# Patient Record
Sex: Female | Born: 1975
Health system: Southern US, Community
[De-identification: ages and names within clinical notes are randomized; demographics above are authoritative.]

## PROBLEM LIST (undated history)

## (undated) DIAGNOSIS — J301 Allergic rhinitis due to pollen: Secondary | ICD-10-CM

## (undated) DIAGNOSIS — E559 Vitamin D deficiency, unspecified: Secondary | ICD-10-CM

## (undated) DIAGNOSIS — L309 Dermatitis, unspecified: Secondary | ICD-10-CM

## (undated) DIAGNOSIS — L508 Other urticaria: Secondary | ICD-10-CM

## (undated) DIAGNOSIS — J45909 Unspecified asthma, uncomplicated: Secondary | ICD-10-CM

## (undated) DIAGNOSIS — T7840XA Allergy, unspecified, initial encounter: Secondary | ICD-10-CM

## (undated) DIAGNOSIS — I10 Essential (primary) hypertension: Secondary | ICD-10-CM

## (undated) HISTORY — PX: NASAL SINUS SURGERY: SHX719

## (undated) HISTORY — PX: SPINAL FUSION: SHX223

## (undated) HISTORY — PX: ABDOMINAL HYSTERECTOMY: SHX81

## (undated) HISTORY — PX: PLANTAR FASCIA RELEASE: SHX2239

## (undated) HISTORY — DX: Vitamin D deficiency, unspecified: E55.9

## (undated) HISTORY — DX: Dermatitis, unspecified: L30.9

## (undated) HISTORY — DX: Unspecified asthma, uncomplicated: J45.909

## (undated) HISTORY — DX: Allergy, unspecified, initial encounter: T78.40XA

## (undated) HISTORY — DX: Other urticaria: L50.8

## (undated) HISTORY — PX: OTHER SURGICAL HISTORY: SHX169

## (undated) HISTORY — DX: Allergic rhinitis due to pollen: J30.1

## (undated) HISTORY — DX: Essential (primary) hypertension: I10

---

## 2003-11-12 ENCOUNTER — Other Ambulatory Visit: Payer: Self-pay

## 2004-12-07 ENCOUNTER — Ambulatory Visit: Payer: Self-pay | Admitting: Otolaryngology

## 2006-03-10 ENCOUNTER — Ambulatory Visit: Payer: Self-pay | Admitting: Unknown Physician Specialty

## 2006-12-25 ENCOUNTER — Emergency Department: Payer: Self-pay | Admitting: Emergency Medicine

## 2007-02-15 ENCOUNTER — Ambulatory Visit: Payer: Self-pay | Admitting: Unknown Physician Specialty

## 2007-02-23 ENCOUNTER — Inpatient Hospital Stay: Payer: Self-pay | Admitting: Unknown Physician Specialty

## 2008-12-22 ENCOUNTER — Emergency Department: Payer: Self-pay | Admitting: Emergency Medicine

## 2009-03-04 ENCOUNTER — Inpatient Hospital Stay (HOSPITAL_COMMUNITY): Admission: RE | Admit: 2009-03-04 | Discharge: 2009-03-09 | Payer: Self-pay | Admitting: Neurosurgery

## 2009-10-06 ENCOUNTER — Encounter: Admission: RE | Admit: 2009-10-06 | Discharge: 2009-10-06 | Payer: Self-pay | Admitting: Neurosurgery

## 2010-09-18 LAB — TYPE AND SCREEN
ABO/RH(D): O POS
Antibody Screen: NEGATIVE

## 2010-09-18 LAB — BASIC METABOLIC PANEL
BUN: 6 mg/dL (ref 6–23)
Chloride: 106 mEq/L (ref 96–112)
Creatinine, Ser: 0.73 mg/dL (ref 0.4–1.2)
Glucose, Bld: 86 mg/dL (ref 70–99)

## 2010-09-18 LAB — CBC
Hemoglobin: 14.8 g/dL (ref 12.0–15.0)
MCV: 94.8 fL (ref 78.0–100.0)
Platelets: 227 10*3/uL (ref 150–400)

## 2010-09-18 LAB — ABO/RH: ABO/RH(D): O POS

## 2012-05-05 LAB — HM PAP SMEAR

## 2012-06-19 ENCOUNTER — Ambulatory Visit: Payer: Self-pay | Admitting: Family Medicine

## 2012-12-12 DIAGNOSIS — M961 Postlaminectomy syndrome, not elsewhere classified: Secondary | ICD-10-CM | POA: Insufficient documentation

## 2012-12-12 DIAGNOSIS — G4700 Insomnia, unspecified: Secondary | ICD-10-CM | POA: Insufficient documentation

## 2013-03-08 ENCOUNTER — Ambulatory Visit: Payer: Self-pay | Admitting: Anesthesiology

## 2013-03-08 DIAGNOSIS — I10 Essential (primary) hypertension: Secondary | ICD-10-CM

## 2013-03-15 ENCOUNTER — Other Ambulatory Visit: Payer: Self-pay | Admitting: Physical Medicine and Rehabilitation

## 2013-03-15 DIAGNOSIS — M961 Postlaminectomy syndrome, not elsewhere classified: Secondary | ICD-10-CM

## 2013-03-26 ENCOUNTER — Other Ambulatory Visit: Payer: Self-pay

## 2013-04-04 ENCOUNTER — Ambulatory Visit: Payer: Self-pay | Admitting: Otolaryngology

## 2013-04-05 LAB — PATHOLOGY REPORT

## 2013-07-17 ENCOUNTER — Ambulatory Visit: Payer: Self-pay | Admitting: Family Medicine

## 2013-10-22 DIAGNOSIS — F1120 Opioid dependence, uncomplicated: Secondary | ICD-10-CM | POA: Insufficient documentation

## 2013-10-23 NOTE — Progress Notes (Signed)
[facility], site is unremarkable and pt tolerated procedure well. MRI will be done on Saturday.

## 2013-10-27 ENCOUNTER — Inpatient Hospital Stay
Admission: RE | Admit: 2013-10-27 | Discharge: 2013-10-27 | Disposition: A | Discharge status: Home / Self Care | Payer: Medicaid Other | Source: Ambulatory Visit | Attending: Physical Medicine and Rehabilitation | Admitting: Physical Medicine and Rehabilitation

## 2013-11-03 ENCOUNTER — Ambulatory Visit
Admission: RE | Admit: 2013-11-03 | Discharge: 2013-11-03 | Disposition: A | Discharge status: Home / Self Care | Payer: Medicaid Other | Source: Ambulatory Visit | Attending: Physical Medicine and Rehabilitation | Admitting: Physical Medicine and Rehabilitation

## 2013-11-03 DIAGNOSIS — M961 Postlaminectomy syndrome, not elsewhere classified: Secondary | ICD-10-CM

## 2013-11-03 MED ORDER — GADOBENATE DIMEGLUMINE 529 MG/ML IV SOLN
17.0000 mL | Freq: Once | INTRAVENOUS | Status: AC | PRN
  Administered 2013-11-03: 17 mL via INTRAVENOUS

## 2014-07-25 ENCOUNTER — Other Ambulatory Visit: Payer: Self-pay | Admitting: Neurosurgery

## 2014-07-25 DIAGNOSIS — M5126 Other intervertebral disc displacement, lumbar region: Secondary | ICD-10-CM

## 2014-08-05 ENCOUNTER — Other Ambulatory Visit: Payer: Medicaid Other

## 2014-08-05 ENCOUNTER — Inpatient Hospital Stay: Admission: RE | Admit: 2014-08-05 | Payer: Medicaid Other | Source: Ambulatory Visit

## 2014-08-07 ENCOUNTER — Ambulatory Visit
Admission: RE | Admit: 2014-08-07 | Discharge: 2014-08-07 | Disposition: A | Discharge status: Home / Self Care | Payer: Medicaid Other | Source: Ambulatory Visit | Attending: Neurosurgery | Admitting: Neurosurgery

## 2014-08-07 DIAGNOSIS — M5126 Other intervertebral disc displacement, lumbar region: Secondary | ICD-10-CM

## 2014-08-07 MED ORDER — DIAZEPAM 5 MG PO TABS
10.0000 mg | ORAL_TABLET | Freq: Once | ORAL | Status: AC
  Administered 2014-08-07: 10 mg via ORAL

## 2014-08-07 MED ORDER — IOHEXOL 180 MG/ML  SOLN
15.0000 mL | Freq: Once | INTRAMUSCULAR | Status: AC | PRN
  Administered 2014-08-07: 15 mL via INTRATHECAL

## 2014-08-07 MED ORDER — HYDROMORPHONE HCL 2 MG/ML IJ SOLN
2.0000 mg | Freq: Once | INTRAMUSCULAR | Status: AC
Start: 2014-08-07 — End: 2014-08-07
  Administered 2014-08-07: 2 mg via INTRAMUSCULAR

## 2014-08-07 MED ORDER — ONDANSETRON HCL 4 MG/2ML IJ SOLN: 4.0000 mg | Freq: Four times a day (QID) | INTRAMUSCULAR | Status: DC | PRN

## 2014-08-07 MED ORDER — ONDANSETRON HCL 4 MG/2ML IJ SOLN
4.0000 mg | Freq: Once | INTRAMUSCULAR | Status: AC
  Administered 2014-08-07: 4 mg via INTRAMUSCULAR

## 2014-08-07 NOTE — Progress Notes (Signed)
Patient states she has been off Amitriptyline for the past five days.  jkl

## 2014-08-07 NOTE — Discharge Instructions (Signed)
Myelogram Discharge Instructions  1. Go home and rest quietly for the next 24 hours.  It is important to lie flat for the next 24 hours.  Get up only to go to the restroom.  You may lie in the bed or on a couch on your back, your stomach, your left side or your right side.  You may have one pillow under your head.  You may have pillows between your knees while you are on your side or under your knees while you are on your back.  2. DO NOT drive today.  Recline the seat as far back as it will go, while still wearing your seat belt, on the way home.  3. You may get up to go to the bathroom as needed.  You may sit up for 10 minutes to eat.  You may resume your normal diet and medications unless otherwise indicated.  Drink plenty of extra fluids today and tomorrow.  4. The incidence of a spinal headache with nausea and/or vomiting is about 5% (one in 20 patients).  If you develop a headache, lie flat and drink plenty of fluids until the headache goes away.  Caffeinated beverages may be helpful.  If you develop severe nausea and vomiting or a headache that does not go away with flat bed rest, call [phone].  5. You may resume normal activities after your 24 hours of bed rest is over; however, do not exert yourself strongly or do any heavy lifting tomorrow.  6. Call your physician for a follow-up appointment.   You may resume Amitriptyline on Thursday, August 08, 2014 after 8:00a.m.

## 2014-08-12 ENCOUNTER — Telehealth: Payer: Self-pay | Admitting: Radiology

## 2014-08-12 NOTE — Telephone Encounter (Signed)
Pt called earlier today about continued headache post myelo on 08/07/14. Told her she could do more bedrest and explained blood patch. Pt will do bedrest today and decide about blood patch tomorrow. Order received for blood patch if needed. Pt will call when she decides.

## 2014-08-13 ENCOUNTER — Other Ambulatory Visit: Payer: Self-pay | Admitting: Neurosurgery

## 2014-08-13 DIAGNOSIS — M5126 Other intervertebral disc displacement, lumbar region: Secondary | ICD-10-CM

## 2014-08-13 DIAGNOSIS — G971 Other reaction to spinal and lumbar puncture: Secondary | ICD-10-CM

## 2014-08-15 ENCOUNTER — Ambulatory Visit
Admission: RE | Admit: 2014-08-15 | Discharge: 2014-08-15 | Disposition: A | Discharge status: Home / Self Care | Payer: Medicaid Other | Source: Ambulatory Visit | Attending: Neurosurgery | Admitting: Neurosurgery

## 2014-08-15 DIAGNOSIS — G971 Other reaction to spinal and lumbar puncture: Secondary | ICD-10-CM

## 2014-08-15 DIAGNOSIS — M5126 Other intervertebral disc displacement, lumbar region: Secondary | ICD-10-CM

## 2014-08-15 MED ORDER — IOHEXOL 180 MG/ML  SOLN
1.0000 mL | Freq: Once | INTRAMUSCULAR | Status: AC | PRN
  Administered 2014-08-15: 1 mL via EPIDURAL

## 2014-08-15 MED ORDER — DIAZEPAM 5 MG PO TABS: 5.0000 mg | ORAL_TABLET | Freq: Once | ORAL | Status: DC

## 2014-08-15 NOTE — Progress Notes (Signed)
20cc blood drawn from right AC space for Blood Patch; site unremarkable. 

## 2014-08-15 NOTE — Discharge Instructions (Signed)

## 2014-08-15 NOTE — Progress Notes (Signed)
Pt arrived today for blood patch for post myelo headache. Discharge instructions explained at length and pt states she understands.

## 2014-10-04 NOTE — Op Note (Signed)
PATIENT NAME:  Holly French, Raziyah M MR#:  [id number] DATE OF BIRTH:  Nov 21, 1975  DATE OF PROCEDURE:  04/04/2013  PREOPERATIVE DIAGNOSIS: Chronic maxillary, ethmoid and frontal sinusitis.   POSTOPERATIVE DIAGNOSIS: Chronic maxillary, ethmoid and frontal sinusitis.   PROCEDURES: 1. Bilateral endoscopic total ethmoidectomies.  2. Bilateral frontal recess exploration with tissue removal.  3. Left revision maxillary antrostomy with tissue removal.  4. Computer-assisted image-guided surgery.   SURGEON: Ollen Grossaul S. Willeen CassBennett, MD  ANESTHESIA: General endotracheal.   INDICATIONS: A 39 year old female with a long history of chronic sinusitis and allergies, unresponsive to medical management.   FINDINGS: There was mucosal thickening throughout the ethmoids and a lot of inflammation throughout the ethmoid sinuses. There was polypoid soft tissue blocking the infundibulum of the left maxillary sinus, though the previously made maxillary antrostomy was open. This was likely preventing proper drainage of the sinus through the natural ostium pathway. There was quite a bit of mucosal thickening in the frontal recess bilaterally.   COMPLICATIONS: None.   DESCRIPTION OF PROCEDURE: After obtaining informed consent, the patient was taken to the operating room and placed in the supine position. After induction of general endotracheal anesthesia, the patient was turned 90 degrees. The head was draped in the usual fashion and the Stryker image-guided mask placed in the usual fashion. The patient registered with the image-guided system. The nose was decongested with Afrin, and 1% lidocaine with epinephrine was injected in the region of the middle meatus and middle turbinate bilaterally. After she was prepped and draped, the image-guided suction was registered with the system and assessed for accuracy, which was felt to be excellent. The left nasal cavity was inspected endoscopically with a 0 degree scope. Utilizing the  image-guided suction, the middle ethmoid sinuses were opened on the left side using a straight through-cutting forceps to enter through the basal lamella. The anterior ethmoids had been previously opened. With frequent use of the image-guided suction to reassess anatomy, the ethmoids were then dissected using a combination of through-cutting forceps as well as some conservative use of the microdebrider. Polypoid mucosal change was found, and some small polyps were removed. Dissection proceeded back to the sphenoid, which was not entered, and the skull base superiorly, again being very careful to avoid injury to the lamina papyracea or base of the skull. Dissection proceeded more anteriorly up into the frontal recess region. The left maxillary sinus was noted to have polypoid mucosal thickening blocking the infundibulum, and this was removed using a combination of backbiting through-cutting forceps and the microdebrider. There was a lot of thick, but clear mucus within this sinus. Next, 30 and 70 degree scopes were used to visualize the frontal recess, which was carefully dissected using through-cutting forceps until the frontal recess was opened and the frontal duct identified and widely patent. The curved image-guided suction was used to help frequently assess anatomy during this portion of the dissection. The same procedure was then performed on the right side, with the exception of there not being any need to further open the right maxillary sinus. Once again the image-guided suctions were used to help frequently reassess the anatomy. Once the procedure was completed, the nose was suctioned to remove any blood clot and Stammberger absorbable sinus packing placed on either side of the nose in the middle meatus. Bleeding was felt to be well controlled. She was then returned to the anesthesiologist for awakening. She was awakened and taken to the recovery room in good condition postoperatively. Blood loss was  approximately  100 mL.  ____________________________ Ollen Gross. Willeen Cass, MD psb:lb D: 04/04/2013 09:55:31 ET T: 04/04/2013 10:15:57 ET JOB#: [id number]  cc: Ollen Gross. Willeen Cass, MD, <Dictator> Sandi Mealy MD ELECTRONICALLY SIGNED 04/17/2013 12:07

## 2015-01-16 DIAGNOSIS — G8929 Other chronic pain: Secondary | ICD-10-CM | POA: Insufficient documentation

## 2015-01-16 DIAGNOSIS — E559 Vitamin D deficiency, unspecified: Secondary | ICD-10-CM | POA: Insufficient documentation

## 2015-01-16 DIAGNOSIS — L259 Unspecified contact dermatitis, unspecified cause: Secondary | ICD-10-CM | POA: Insufficient documentation

## 2015-01-16 DIAGNOSIS — I10 Essential (primary) hypertension: Secondary | ICD-10-CM

## 2015-01-16 DIAGNOSIS — J309 Allergic rhinitis, unspecified: Secondary | ICD-10-CM | POA: Insufficient documentation

## 2015-01-16 DIAGNOSIS — L509 Urticaria, unspecified: Secondary | ICD-10-CM | POA: Insufficient documentation

## 2015-01-16 DIAGNOSIS — M545 Low back pain, unspecified: Secondary | ICD-10-CM | POA: Insufficient documentation

## 2015-01-16 DIAGNOSIS — J45909 Unspecified asthma, uncomplicated: Secondary | ICD-10-CM | POA: Insufficient documentation

## 2015-01-16 DIAGNOSIS — N809 Endometriosis, unspecified: Secondary | ICD-10-CM | POA: Insufficient documentation

## 2015-01-16 DIAGNOSIS — L309 Dermatitis, unspecified: Secondary | ICD-10-CM | POA: Insufficient documentation

## 2015-01-16 DIAGNOSIS — L508 Other urticaria: Secondary | ICD-10-CM

## 2015-01-17 ENCOUNTER — Ambulatory Visit (INDEPENDENT_AMBULATORY_CARE_PROVIDER_SITE_OTHER): Payer: Medicaid Other | Admitting: Unknown Physician Specialty

## 2015-01-17 ENCOUNTER — Encounter: Payer: Self-pay | Admitting: Unknown Physician Specialty

## 2015-01-17 VITALS — BP 124/87 | HR 101 | Temp 98.3°F | Ht 62.7 in | Wt 206.8 lb

## 2015-01-17 DIAGNOSIS — J0100 Acute maxillary sinusitis, unspecified: Secondary | ICD-10-CM

## 2015-01-17 MED ORDER — HYDROCOD POLST-CPM POLST ER 10-8 MG/5ML PO SUER: 5.0000 mL | Freq: Two times a day (BID) | ORAL | Status: DC | PRN

## 2015-01-17 MED ORDER — AMOXICILLIN-POT CLAVULANATE 875-125 MG PO TABS: 1.0000 | ORAL_TABLET | Freq: Two times a day (BID) | ORAL | Status: DC

## 2015-01-17 NOTE — Progress Notes (Signed)
BP 124/87 mmHg  Pulse 101  Temp(Src) 98.3 F (36.8 C)  Ht 5' 2.7" (1.593 m)  Wt 206 lb 12.8 oz (93.804 kg)  BMI 36.96 kg/m2  SpO2 96%  LMP 02/25/2009 (Approximate)   Subjective:    Patient ID: Holly French, female    DOB: [birth year], 39 y.o.   MRN: [id number]  HPI: Holly French is a 39 y.o. female  Chief Complaint  Patient presents with  . Sinusitis    pt states all symptoms started about a week and a half ago, tried taking OTC medications but dont seem to be helping  . Nasal Congestion  . Cough  . Headache  . Sore Throat   Sinusitis This is a new problem. The current episode started 1 to 4 weeks ago. The problem has been gradually worsening since onset. There has been no fever. The pain is moderate. Associated symptoms include congestion, coughing, headaches, sinus pressure and a sore throat. Pertinent negatives include no chills, ear pain or swollen glands. Past treatments include oral decongestants. The treatment provided no relief.  Cough Associated symptoms include headaches and a sore throat. Pertinent negatives include no chills or ear pain.  Headache  Associated symptoms include coughing, sinus pressure and a sore throat. Pertinent negatives include no ear pain or swollen glands.  Sore Throat  Associated symptoms include congestion, coughing and headaches. Pertinent negatives include no ear pain or swollen glands.     Relevant past medical, surgical, family and social history reviewed and updated as indicated. Interim medical history since our last visit reviewed. Allergies and medications reviewed and updated.  Review of Systems  Constitutional: Negative for chills.  HENT: Positive for congestion, sinus pressure and sore throat. Negative for ear pain.   Respiratory: Positive for cough.   Neurological: Positive for headaches.    Per HPI unless specifically indicated above     Objective:    BP 124/87 mmHg  Pulse 101  Temp(Src) 98.3 F (36.8 C)   Ht 5' 2.7" (1.593 m)  Wt 206 lb 12.8 oz (93.804 kg)  BMI 36.96 kg/m2  SpO2 96%  LMP 02/25/2009 (Approximate)  Wt Readings from Last 3 Encounters:  01/17/15 206 lb 12.8 oz (93.804 kg)  10/04/14 206 lb (93.441 kg)    Physical Exam  Constitutional: She is oriented to person, place, and time. She appears well-developed and well-nourished. No distress.  HENT:  Head: Normocephalic and atraumatic.  Right Ear: Tympanic membrane and ear canal normal.  Left Ear: Tympanic membrane and ear canal normal.  Nose: No rhinorrhea. Right sinus exhibits maxillary sinus tenderness. Right sinus exhibits no frontal sinus tenderness. Left sinus exhibits maxillary sinus tenderness. Left sinus exhibits no frontal sinus tenderness.  Eyes: Conjunctivae and lids are normal. Right eye exhibits no discharge. Left eye exhibits no discharge. No scleral icterus.  Cardiovascular: Normal rate and regular rhythm.   Pulmonary/Chest: Effort normal and breath sounds normal. No respiratory distress.  Abdominal: Normal appearance. There is no splenomegaly or hepatomegaly.  Musculoskeletal: Normal range of motion.  Neurological: She is alert and oriented to person, place, and time.  Skin: Skin is intact. No rash noted. No pallor.  Psychiatric: She has a normal mood and affect. Her behavior is normal. Judgment and thought content normal.    Results for orders placed or performed in visit on 01/16/15  HM PAP SMEAR  Result Value Ref Range   HM Pap smear from PP       Assessment & Plan:  Problem List Items Addressed This Visit    None    Visit Diagnoses    Acute maxillary sinusitis, recurrence not specified    -  Primary    Relevant Medications    amoxicillin-clavulanate (AUGMENTIN) 875-125 MG per tablet    chlorpheniramine-HYDROcodone (TUSSIONEX PENNKINETIC ER) 10-8 MG/5ML SUER        Follow up plan: Return if symptoms worsen or fail to improve.

## 2015-03-28 ENCOUNTER — Encounter: Payer: Self-pay | Admitting: Family Medicine

## 2015-03-28 ENCOUNTER — Ambulatory Visit (INDEPENDENT_AMBULATORY_CARE_PROVIDER_SITE_OTHER): Payer: Medicaid Other | Admitting: Family Medicine

## 2015-03-28 VITALS — BP 122/80 | HR 94 | Temp 98.6°F | Ht 62.1 in | Wt 200.0 lb

## 2015-03-28 DIAGNOSIS — J0100 Acute maxillary sinusitis, unspecified: Secondary | ICD-10-CM | POA: Diagnosis not present

## 2015-03-28 MED ORDER — HYDROCOD POLST-CPM POLST ER 10-8 MG/5ML PO SUER: 5.0000 mL | Freq: Two times a day (BID) | ORAL | Status: DC | PRN

## 2015-03-28 MED ORDER — AMOXICILLIN-POT CLAVULANATE 875-125 MG PO TABS: 1.0000 | ORAL_TABLET | Freq: Two times a day (BID) | ORAL | Status: DC

## 2015-03-28 NOTE — Patient Instructions (Signed)

## 2015-03-28 NOTE — Progress Notes (Signed)
BP 122/80 mmHg  Pulse 94  Temp(Src) 98.6 F (37 C)  Ht 5' 2.1" (1.577 m)  Wt 200 lb (90.719 kg)  BMI 36.48 kg/m2  SpO2 99%  LMP 02/25/2009 (Approximate)   Subjective:    Patient ID: Holly French, female    DOB: 10-24-1975, 39 y.o.   MRN: [id number]  HPI: Holly French is a 39 y.o. female  Chief Complaint  Patient presents with  . URI    X 2 weeks, Cough and congestion   UPPER RESPIRATORY TRACT INFECTION x 2 weeks Worst symptom: cough and face pain Fever: no Cough: yes Shortness of breath: yes Wheezing: no Chest pain: no Chest tightness: no Chest congestion: no Nasal congestion: yes Runny nose: no Post nasal drip: yes Sneezing: no Sore throat: yes Swollen glands: yes Sinus pressure: yes Headache: yes Face pain: yes Toothache: no Ear pain: no  Ear pressure: no  Eyes red/itching:no Eye drainage/crusting: no  Vomiting: no Rash: no Fatigue: yes Sick contacts: no Strep contacts: no  Context: worse Recurrent sinusitis: yes Relief with OTC cold/cough medications: no  Treatments attempted: cold/sinus, mucinex, anti-histamine and pseudoephedrine   Relevant past medical, surgical, family and social history reviewed and updated as indicated. Interim medical history since our last visit reviewed. Allergies and medications reviewed and updated.  Review of Systems  Constitutional: Negative.   HENT: Positive for congestion, postnasal drip, rhinorrhea, sinus pressure and sore throat. Negative for dental problem, drooling, ear discharge, ear pain, facial swelling, hearing loss, mouth sores, nosebleeds, sneezing, tinnitus, trouble swallowing and voice change.   Respiratory: Negative.   Cardiovascular: Negative.   Gastrointestinal: Negative.   Psychiatric/Behavioral: Negative.     Per HPI unless specifically indicated above     Objective:    BP 122/80 mmHg  Pulse 94  Temp(Src) 98.6 F (37 C)  Ht 5' 2.1" (1.577 m)  Wt 200 lb (90.719 kg)  BMI 36.48  kg/m2  SpO2 99%  LMP 02/25/2009 (Approximate)  Wt Readings from Last 3 Encounters:  03/28/15 200 lb (90.719 kg)  01/17/15 206 lb 12.8 oz (93.804 kg)  10/04/14 206 lb (93.441 kg)    Physical Exam  Constitutional: She is oriented to person, place, and time. She appears well-developed and well-nourished. No distress.  HENT:  Head: Normocephalic and atraumatic.  Right Ear: Hearing and external ear normal.  Left Ear: Hearing and external ear normal.  Nose: Nose normal.  Mouth/Throat: Oropharynx is clear and moist. No oropharyngeal exudate.  Red, swollen, angry turbinates with visible pus bilaterally  Eyes: Conjunctivae and lids are normal. Pupils are equal, round, and reactive to light. Right eye exhibits no discharge. Left eye exhibits no discharge. No scleral icterus.  Neck: Normal range of motion. Neck supple. No JVD present. No tracheal deviation present. No thyromegaly present.  Cardiovascular: Normal rate, regular rhythm, normal heart sounds and intact distal pulses.  Exam reveals no gallop and no friction rub.   No murmur heard. Pulmonary/Chest: Effort normal and breath sounds normal. No stridor. No respiratory distress. She has no wheezes. She has no rales. She exhibits no tenderness.  Musculoskeletal: Normal range of motion.  Lymphadenopathy:    She has cervical adenopathy.  Neurological: She is alert and oriented to person, place, and time.  Skin: Skin is warm, dry and intact. No rash noted. No erythema. No pallor.  Psychiatric: She has a normal mood and affect. Her speech is normal and behavior is normal. Judgment and thought content normal. Cognition and memory are normal.  Nursing note and vitals reviewed.   Results for orders placed or performed in visit on 01/16/15  HM PAP SMEAR  Result Value Ref Range   HM Pap smear from PP       Assessment & Plan:   Problem List Items Addressed This Visit    None    Visit Diagnoses    Acute maxillary sinusitis, recurrence not  specified    -  Primary    Will treat with augmentin. Tussionex for symptomatic treatment and to help her sleep. Call if not getting better or getting worse.     Relevant Medications    chlorpheniramine-HYDROcodone (TUSSIONEX PENNKINETIC ER) 10-8 MG/5ML SUER    amoxicillin-clavulanate (AUGMENTIN) 875-125 MG tablet        Follow up plan: Return if symptoms worsen or fail to improve.

## 2015-04-11 ENCOUNTER — Encounter: Payer: Self-pay | Admitting: Unknown Physician Specialty

## 2015-04-11 ENCOUNTER — Ambulatory Visit (INDEPENDENT_AMBULATORY_CARE_PROVIDER_SITE_OTHER): Payer: Medicaid Other | Admitting: Unknown Physician Specialty

## 2015-04-11 VITALS — BP 116/77 | HR 105 | Temp 98.6°F | Ht 62.0 in | Wt 200.4 lb

## 2015-04-11 DIAGNOSIS — M79671 Pain in right foot: Secondary | ICD-10-CM

## 2015-04-11 DIAGNOSIS — J45909 Unspecified asthma, uncomplicated: Secondary | ICD-10-CM | POA: Diagnosis not present

## 2015-04-11 DIAGNOSIS — I10 Essential (primary) hypertension: Secondary | ICD-10-CM | POA: Diagnosis not present

## 2015-04-11 LAB — MICROALBUMIN, URINE WAIVED
Creatinine, Urine Waived: 100 mg/dL (ref 10–300)
MICROALB, UR WAIVED: 10 mg/L (ref 0–19)
Microalb/Creat Ratio: <30 mg/g (ref <30)

## 2015-04-11 MED ORDER — LOSARTAN POTASSIUM 50 MG PO TABS: 50.0000 mg | ORAL_TABLET | Freq: Every day | ORAL | Status: DC

## 2015-04-11 MED ORDER — MONTELUKAST SODIUM 10 MG PO TABS: 10.0000 mg | ORAL_TABLET | Freq: Every day | ORAL | Status: DC

## 2015-04-11 NOTE — Assessment & Plan Note (Signed)
Stable, continue present medications.   

## 2015-04-11 NOTE — Assessment & Plan Note (Signed)
Comprehensive metabolic panel ordered results pending Uric acid ordered results pending Stable, continue present medications.    

## 2015-04-11 NOTE — Progress Notes (Signed)
BP 116/77 mmHg  Pulse 105  Temp(Src) 98.6 F (37 C)  Ht 5\' 2"  (1.575 m)  Wt 200 lb 6.4 oz (90.901 kg)  BMI 36.64 kg/m2  SpO2 98%  LMP 02/25/2009 (Approximate)   Subjective:    Patient ID: Holly French, female    DOB: [birth year], 39 y.o.   MRN: [id number]  HPI: Holly French is a 39 y.o. female  Chief Complaint  Patient presents with  . Cyst    pt states she has a knot on heal of right foot. States it came up last Monday (03/31/15)  . Medication Refill    pt states she needs a refill on losartan and singulair   Right Foot Pain The pt presents with c/o pain and a knot on the right plantar surface of her foot onset 2 weeks ago.  The quality of pain is aching and throbbing.  Aggravating factors include walking alleviating factors include sitting.  She has applied icy hot patches, stretching,  naproxen, advil, tylenol, soaking foot in epson salt without relief of symptoms.  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  Pertinent negatives denies trauma, erythema, numbness/tingling,  rash/lesions, fevers, or chills  Hypertension This is a chronic problem medication compliance is excellent.  She is satisfied with the current treatment.  She does not check her blood pressure at home.  Pertinent negatives denies shortness of breath, chest pain, palpitations, edema, headaches, or visual changes  Asthma This is a chronic problem she is requesting a refill of her Singulair.  Medication compliance is excellent she is satisfied with the current treatment.  She has not had any recent hospitalizations. She states she has not had to use her rescue inhaler.  Pertinent negatives denies shortness of breath, wheezing, chest pain or tightness, or palpitations  Relevant past medical, surgical, family and social history reviewed and updated as indicated. Interim medical history since our last visit reviewed. Allergies and medications reviewed  and updated.  Review of Systems  Constitutional: Negative.   Eyes: Negative.   Respiratory: Negative.   Cardiovascular: Negative.   Gastrointestinal: Negative.   Endocrine: Negative.   Genitourinary: Negative.   Musculoskeletal:       Right foot pain  Neurological: Negative.   Psychiatric/Behavioral: Negative.     Per HPI unless specifically indicated above     Objective:    BP 116/77 mmHg  Pulse 105  Temp(Src) 98.6 F (37 C)  Ht 5\' 2"  (1.575 m)  Wt 200 lb 6.4 oz (90.901 kg)  BMI 36.64 kg/m2  SpO2 98%  LMP 02/25/2009 (Approximate)  Wt Readings from Last 3 Encounters:  04/11/15 200 lb 6.4 oz (90.901 kg)  03/28/15 200 lb (90.719 kg)  01/17/15 206 lb 12.8 oz (93.804 kg)    Physical Exam  Constitutional: She is oriented to person, place, and time. She appears well-developed and well-nourished. No distress.  HENT:  Head: Normocephalic and atraumatic.  Right Ear: External ear normal.  Left Ear: External ear normal.  Nose: Nose normal.  Neck: Normal range of motion. Neck supple.  Cardiovascular: Normal rate, regular rhythm, normal heart sounds and intact distal pulses.   Pulmonary/Chest: Effort normal and breath sounds normal. No respiratory distress. She has no wheezes. She has no rales.  Musculoskeletal: Normal range of motion.  Large swollen slightly firm area that is tender with palpation on the medial planter surface of right foot  Neurological: She  is alert and oriented to person, place, and time.  Skin: Skin is warm and dry. No rash noted. She is not diaphoretic. No erythema. No pallor.  Psychiatric: She has a normal mood and affect. Her behavior is normal. Judgment and thought content normal.       Assessment & Plan:   Problem List Items Addressed This Visit      Unprioritized   Asthma    Stable, continue present medications.        Relevant Medications   montelukast (SINGULAIR) 10 MG tablet   Hypertension - Primary    Comprehensive metabolic panel  ordered results pending Uric acid ordered results pending Stable, continue present medications.        Relevant Medications   losartan (COZAAR) 50 MG tablet   Other Relevant Orders   Uric acid   Microalbumin, Urine Waived   Comprehensive metabolic panel    Other Visit Diagnoses    Right foot pain        Ordered Ambulatory Referral to Orthopedic    Relevant Orders    Ambulatory referral to Orthopedic Surgery        Follow up plan: Return for schedule PE.

## 2015-04-12 LAB — COMPREHENSIVE METABOLIC PANEL
A/G RATIO: 2 (ref 1.1–2.5)
ALBUMIN: 4.3 g/dL (ref 3.5–5.5)
ALT: 20 IU/L (ref 0–32)
AST: 17 IU/L (ref 0–40)
Alkaline Phosphatase: 72 IU/L (ref 39–117)
BUN/Creatinine Ratio: 22 — ABNORMAL HIGH (ref 8–20)
BUN: 12 mg/dL (ref 6–20)
Bilirubin Total: 0.2 mg/dL (ref 0.0–1.2)
CALCIUM: 9.2 mg/dL (ref 8.7–10.2)
CHLORIDE: 102 mmol/L (ref 97–106)
CO2: 22 mmol/L (ref 18–29)
CREATININE: 0.55 mg/dL — AB (ref 0.57–1.00)
GFR calc Af Amer: 139 mL/min/{1.73_m2} (ref >59)
GFR, EST NON AFRICAN AMERICAN: 120 mL/min/{1.73_m2} (ref >59)
GLOBULIN, TOTAL: 2.1 g/dL (ref 1.5–4.5)
Glucose: 86 mg/dL (ref 65–99)
POTASSIUM: 4.7 mmol/L (ref 3.5–5.2)
Sodium: 141 mmol/L (ref 136–144)
TOTAL PROTEIN: 6.4 g/dL (ref 6.0–8.5)

## 2015-04-12 LAB — URIC ACID: Uric Acid: 3.6 mg/dL (ref 2.5–7.1)

## 2015-04-17 ENCOUNTER — Encounter: Payer: Self-pay | Admitting: Podiatry

## 2015-04-17 ENCOUNTER — Ambulatory Visit (INDEPENDENT_AMBULATORY_CARE_PROVIDER_SITE_OTHER): Payer: Medicaid Other

## 2015-04-17 ENCOUNTER — Ambulatory Visit (INDEPENDENT_AMBULATORY_CARE_PROVIDER_SITE_OTHER): Payer: Medicaid Other | Admitting: Podiatry

## 2015-04-17 DIAGNOSIS — R52 Pain, unspecified: Secondary | ICD-10-CM

## 2015-04-17 DIAGNOSIS — M722 Plantar fascial fibromatosis: Secondary | ICD-10-CM

## 2015-04-17 MED ORDER — DICLOFENAC SODIUM 75 MG PO TBEC: 75.0000 mg | DELAYED_RELEASE_TABLET | Freq: Two times a day (BID) | ORAL | Status: DC

## 2015-04-17 NOTE — Progress Notes (Signed)
Subjective:    Patient ID: Holly French, female    DOB: 21-Jul-1975, 39 y.o.   MRN: [id number]  HPI  39 year old female presents the office for concerns of right foot pain which has been ongoing for approximately 3 weeks. She states that she has a knot on the bottom of her right heel. She states that she has pain with putting pressure to this area. She does have a history of plantar fasciitis and she has done this exercises and icing as she did previously however this is continued. She denies any recent injury or trauma. She denies any redness or increase in warmth to the area. The pain does not wake her up at night. No tingling or numbness. No other complaints at this time.  Review of Systems  All other systems reviewed and are negative.      Objective:   Physical Exam General: AAO x3, NAD  Dermatological: Skin is warm, dry and supple bilateral. Nails x 10 are well manicured; remaining integument appears unremarkable at this time. There are no open sores, no preulcerative lesions, no rash or signs of infection present.  Vascular: Dorsalis Pedis artery and Posterior Tibial artery pedal pulses are 2/4 bilateral with immedate capillary fill time. Pedal hair growth present. No varicosities and no lower extremity edema present bilateral. There is no pain with calf compression, swelling, warmth, erythema.   Neruologic: Grossly intact via light touch bilateral. Vibratory intact via tuning fork bilateral. Protective threshold with Semmes Wienstein monofilament intact to all pedal sites bilateral. Patellar and Achilles deep tendon reflexes 2+ bilateral. No Babinski or clonus noted bilateral.   Musculoskeletal: No gross boney pedal deformities bilateral. There is tenderness palpation along the plantar medial aspect of the right foot just distal to the calcaneal tubercle. There does appear to be a localized area of edema to this area. There is no overlying erythema or increase in warmth. There is no  pain along the medial band the plantar fascial in the arch of the foot. There is no pain with lateral compression of the calcaneus. No pelvic course the Achilles tendon. Equinus is present. No other areas of tenderness to bilateral lower extremities. No pain, crepitus, or limitation noted with foot and ankle range of motion bilateral. Muscular strength 5/5 in all groups tested bilateral.  Gait: Unassisted, Nonantalgic.        Assessment & Plan:  39 year old female with right heel pain, likely plantar fasciitis however cannot rule out tear. -X-rays were obtained and reviewed with the patient. There does not appear to be any definitive evidence of acute fracture stress fracture identified this time. Stress fracture unlikely causing her pain. -Treatment options discussed including all alternatives, risks, and complications -Etiology of symptoms were discussed -Patient elects to proceed with steroid injection into the right heel. I discussed with her risks of the steroid injection if there was a tear however given the swelling and pain to this area should like to proceed with this. Under sterile skin preparation, a total of 2.5cc of kenalog 10, 0.5% Marcaine plain, and 2% lidocaine plain were infiltrated into the symptomatic area without complication. A band-aid was applied. Patient tolerated the injection well without complication. Post-injection care with discussed with the patient. Discussed with the patient to ice the area over the next couple of days to help prevent a steroid flare.  -Recommend immobilization in a CAM boot. A prescription for this was given to the patient. -Ice and elevation. -Prescribed voltaren. Discussed side effects and directed to  call the office and stopped any are to occur. -Follow-up in 2 weeks or sooner if any problems arise. In the meantime, encouraged to call the office with any questions, concerns, change in symptoms.  *repeat x-ray if still having pain   Ovid Curd, dPM

## 2015-05-01 ENCOUNTER — Ambulatory Visit (INDEPENDENT_AMBULATORY_CARE_PROVIDER_SITE_OTHER): Payer: Medicaid Other

## 2015-05-01 ENCOUNTER — Ambulatory Visit (INDEPENDENT_AMBULATORY_CARE_PROVIDER_SITE_OTHER): Payer: Medicaid Other | Admitting: Podiatry

## 2015-05-01 DIAGNOSIS — R52 Pain, unspecified: Secondary | ICD-10-CM

## 2015-05-01 DIAGNOSIS — M722 Plantar fascial fibromatosis: Secondary | ICD-10-CM | POA: Diagnosis not present

## 2015-05-01 DIAGNOSIS — M79671 Pain in right foot: Secondary | ICD-10-CM | POA: Diagnosis not present

## 2015-05-01 MED ORDER — METHYLPREDNISOLONE 4 MG PO TBPK: ORAL_TABLET | ORAL | Status: DC

## 2015-05-01 NOTE — Progress Notes (Signed)
Patient ID: Holly French, female   DOB: February 24, 1976, 39 y.o.   MRN: [id number]  Subjective:  39 year old female presents the office they for follow-up evaluation of right heel pain. She states she has worn the boot the majority of the time. She is also state that she her pain is about the same as what it was. She had no relief after the steroid injection. She does get some intermittent numbness and tingling to the bottom of her heel off she stands for long periods of time. She continues with swelling around the heel. No recent injury or trauma. She does work 2 jobs on her feet quite a bit. The pain did wake up at night overnight,  It is more of a throbbing pain is a posterior sharp pain.  She is also continue with the anti-inflammatories but not sure if his been helping. No other complaints at this time.   Objective: General: AAO x3, NAD  Dermatological: Skin is warm, dry and supple bilateral. Nails x 10 are well manicured; remaining integument appears unremarkable at this time. There are no open sores, no preulcerative lesions, no rash or signs of infection present.  Vascular: Dorsalis Pedis artery and Posterior Tibial artery pedal pulses are 2/4 bilateral with immedate capillary fill time. Pedal hair growth present. No varicosities and no lower extremity edema present bilateral. There is no pain with calf compression, swelling, warmth, erythema.   Neruologic: Grossly intact via light touch bilateral. Vibratory intact via tuning fork bilateral. Protective threshold with Semmes Wienstein monofilament intact to all pedal sites bilateral. Patellar and Achilles deep tendon reflexes 2+ bilateral. No Babinski or clonus noted bilateral.   Musculoskeletal: No gross boney pedal deformities bilateral.  There is continued tenderness palpation upon the plantar medial tubercle of the calcaneus at the insertion the plantar fascia. There is no pain along the course the plantar fashion the plantar fascia appears to  be intact at today's appointment. There does appear to be some tenderness along  The calcaneus there is no pain with vibratory sensation. There is localized edema around the medial aspect of the heel. There is no surrounding erythema or increase in warmth. No pain, crepitus, or limitation noted with foot and ankle range of motion bilateral. Muscular strength 5/5 in all groups tested bilateral.  No other areas of tenderness to bilateral lower extremities.  Gait: Unassisted, Nonantalgic.   Assessment:  39 year old female with continuation of right heel pain, likely plantar fasciitis, ? Stress fracture   Plan: -X-rays were obtained and reviewed with the patient.  -Treatment options discussed including all alternatives, risks, and complications -Etiology of symptoms were discussed -Prescribed a Medrol Dosepak. Discussed side effects and directed to call the office should any occur. Hold off on anti-inflammatories we'll thicken the steroid. Once the steroids or complete she can return to anti-inflammatories. -Continue immobilization in a CAM boot for now. -Ice and elevation. -Follow-up 3 weeks or sooner if any problems arise. In the meantime, encouraged to call the office with any questions, concerns, change in symptoms.  *likely MRI if symptoms continue.   Ovid CurdMatthew Leanor Voris, DPM

## 2015-05-02 ENCOUNTER — Ambulatory Visit (INDEPENDENT_AMBULATORY_CARE_PROVIDER_SITE_OTHER): Payer: Medicaid Other | Admitting: Family Medicine

## 2015-05-02 ENCOUNTER — Encounter: Payer: Self-pay | Admitting: Family Medicine

## 2015-05-02 VITALS — BP 97/66 | HR 93 | Temp 98.5°F | Wt 202.0 lb

## 2015-05-02 DIAGNOSIS — Z1239 Encounter for other screening for malignant neoplasm of breast: Secondary | ICD-10-CM | POA: Diagnosis not present

## 2015-05-02 DIAGNOSIS — E669 Obesity, unspecified: Secondary | ICD-10-CM | POA: Diagnosis not present

## 2015-05-02 DIAGNOSIS — F17210 Nicotine dependence, cigarettes, uncomplicated: Secondary | ICD-10-CM | POA: Insufficient documentation

## 2015-05-02 DIAGNOSIS — Z0000 Encounter for general adult medical examination without abnormal findings: Secondary | ICD-10-CM | POA: Diagnosis not present

## 2015-05-02 DIAGNOSIS — Z720 Tobacco use: Secondary | ICD-10-CM | POA: Diagnosis not present

## 2015-05-02 NOTE — Assessment & Plan Note (Signed)
Encouraged weight loss; see AVS 

## 2015-05-02 NOTE — Assessment & Plan Note (Signed)
Encouraged baseline mammogram; regular SBE; routine mammos at age 39

## 2015-05-02 NOTE — Progress Notes (Signed)
Patient ID: Holly French, female   DOB: [birth year], 39 y.o.   MRN: [id number]   Subjective:   Holly French is a 39 y.o. female here for a complete physical exam  Interim issues since last visit:  USPSTF grade A and B recommendations Alcohol: not much, social, every 6-7 months knows to not mix with pain pills Depression:  Depression screen [facility] 2/9 05/02/2015  Decreased Interest 0  Down, Depressed, Hopeless 0  PHQ - 2 Score 0   Hypertension: not needed apparently, BP today is excellent, last pill was more than 5 days; not checking at home Obesity: limited with exercise b/c of her back, tyring to eat better, not late; drinking plenty of water Tobacco use: current smoker; 1/2 ppd; not ready to quit  Lipids: drawn today Glucose: today Colorectal cancer: not due Breast cancer: no personal lumps BRCA gene screening: no fam hx Intimate partner violence: no Cervical cancer screening: UTD, report from 2015 reviewed in Practice Partner Fall prevention/vitamin D: takes vit D Aspirin: n/a Diet: changed her eating habits, not eating late at night Exercise: not able she says because of her back Skin cancer: in the summer time, wears sunscreen; no tanning beds  She does not do flu shots she says  Past Medical History  Diagnosis Date  . Asthma   . Eczema   . Hypertension   . Dermatitis   . Allergy   . Chronic urticaria   . Hayfever   . Vitamin D deficiency    Past Surgical History  Procedure Laterality Date  . Abdominal hysterectomy    . Spinal fusion    . Nasal sinus surgery      x3  . Laparoscopies      x3   Family History  Problem Relation Age of Onset  . Diabetes Mother   . Hyperlipidemia Father   . Allergies Daughter   . COPD Maternal Grandmother   . Lupus Maternal Grandmother   . Cancer Maternal Grandfather     colon   Social History  Substance Use Topics  . Smoking status: Current Every Day Smoker -- 0.25 packs/day    Types: Cigarettes  . Smokeless  tobacco: Never Used  . Alcohol Use: No   Review of Systems  Constitutional: Negative for fever and chills.  HENT: Negative for hearing loss.   Eyes: Negative for visual disturbance.  Respiratory: Negative for wheezing.   Cardiovascular: Negative for palpitations and leg swelling.  Gastrointestinal: Negative for diarrhea and constipation.  Endocrine: Positive for polydipsia (med side effect). Negative for polyuria.  Genitourinary: Negative for vaginal discharge and pelvic pain.  Skin:       No worrisome moles  Allergic/Immunologic: Negative for food allergies.  Neurological: Negative for tremors.  Hematological: Negative for adenopathy. Does not bruise/bleed easily.  Psychiatric/Behavioral: Negative for dysphoric mood.    Objective:   Filed Vitals:   05/02/15 1011  BP: 97/66  Pulse: 93  Temp: 98.5 F (36.9 C)  Weight: 202 lb (91.627 kg)  SpO2: 96%   Body mass index is 36.94 kg/(m^2). Wt Readings from Last 3 Encounters:  05/02/15 202 lb (91.627 kg)  04/11/15 200 lb 6.4 oz (90.901 kg)  03/28/15 200 lb (90.719 kg)   Physical Exam  Constitutional: She appears well-developed and well-nourished.  HENT:  Head: Normocephalic and atraumatic.  Right Ear: Hearing, tympanic membrane, external ear and ear canal normal.  Left Ear: Hearing, tympanic membrane, external ear and ear canal normal.  Eyes: Conjunctivae and EOM are  normal. Right eye exhibits no hordeolum. Left eye exhibits no hordeolum. No scleral icterus.  Neck: Carotid bruit is not present. No thyromegaly present.  Cardiovascular: Normal rate, regular rhythm, S1 normal, S2 normal and normal heart sounds.   No extrasystoles are present.  Pulmonary/Chest: Effort normal and breath sounds normal. No respiratory distress. Right breast exhibits no inverted nipple, no mass, no nipple discharge, no skin change and no tenderness. Left breast exhibits no inverted nipple, no mass, no nipple discharge, no skin change and no tenderness.  Breasts are symmetrical.  Abdominal: Soft. Normal appearance and bowel sounds are normal. She exhibits no distension, no abdominal bruit, no pulsatile midline mass and no mass. There is no hepatosplenomegaly. There is no tenderness. No hernia.  Musculoskeletal: Normal range of motion. She exhibits no edema.  Wearing a cam walker type boot on the right foot/ankle  Lymphadenopathy:       Head (right side): No submandibular adenopathy present.       Head (left side): No submandibular adenopathy present.    She has no cervical adenopathy.    She has no axillary adenopathy.  Neurological: She is alert. She displays no tremor. No cranial nerve deficit. She exhibits normal muscle tone. Gait normal.  Reflex Scores:      Patellar reflexes are 2+ on the right side and 2+ on the left side. Skin: Skin is warm and dry. No bruising and no ecchymosis noted. No cyanosis. No pallor.  Psychiatric: Her speech is normal and behavior is normal. Thought content normal. Her mood appears not anxious. She does not exhibit a depressed mood.    Assessment/Plan:   Problem List Items Addressed This Visit      Other   Preventative health care - Primary    USPSTF grade A and B recommendations reviewed with patient; age-appropriate recommendations, preventive care, screening tests, etc discussed and encouraged; healthy living encouraged; see AVS for patient education given to patient      Relevant Orders   CBC with Differential/Platelet   Lipid Panel w/o Chol/HDL Ratio   Comprehensive metabolic panel   TSH   Tobacco use    Patient is unfortunately not ready to quit; encouraged her to consider; see AVS; I am here to help if needed when she is ready      Breast cancer screening    Encouraged baseline mammogram; regular SBE; routine mammos at age 85      Relevant Orders   MM DIGITAL SCREENING BILATERAL   Obesity    Encouraged weight loss; see AVS         Follow up plan: Return in about 1 year (around  05/01/2016) for complete physical.  An after-visit summary was printed and given to the patient at [facility].  Please see the patient instructions which may contain other information and recommendations beyond what is mentioned above in the assessment and plan.  Orders Placed This Encounter  Procedures  . MM DIGITAL SCREENING BILATERAL  . CBC with Differential/Platelet  . Lipid Panel w/o Chol/HDL Ratio  . Comprehensive metabolic panel  . TSH

## 2015-05-02 NOTE — Patient Instructions (Addendum)
I do recommend yearly flu shots; for individuals who don't want flu shots, try to practice excellent hand hygiene, and avoid nursing homes, day cares, and hospitals during peak flu season; taking vitamin C daily during flu/cold season may help boost your immune system too Check out the information at familydoctor.org entitled "What It Takes to Lose Weight" Try to lose between 1-2 pounds per week by taking in fewer calories and burning off more calories You can succeed by limiting portions, limiting foods dense in calories and fat, becoming more active, and drinking 8 glasses of water a day (64 ounces) Don't skip meals, especially breakfast, as skipping meals may alter your metabolism Do not use over-the-counter weight loss pills or gimmicks that claim rapid weight loss A healthy BMI (or body mass index) is between 18.5 and 24.9 You can calculate your ideal BMI at the [place] website ClubMonetize[employer]fr Call the 1-800-QUIT-NOW line if and when you are ready to quit smoking Please do call to schedule your mammogram; the number to schedule one at either [facility] or [facility] is [phone] Check your blood pressure at a local pharmacy a few times this month and call if over 130 on top or over 85 on the bottom Try to follow the DASH guidelines  Health Maintenance, Female Adopting a healthy lifestyle and getting preventive care can go a long way to promote health and wellness. Talk with your health care provider about what schedule of regular examinations is right for you. This is a good chance for you to check in with your provider about disease prevention and staying healthy. In between checkups, there are plenty of things you can do on your own. Experts have done a lot of research about which lifestyle changes and preventive measures are most likely to keep you healthy. Ask your health care provider for more  information. WEIGHT AND DIET  Eat a healthy diet  Be sure to include plenty of vegetables, fruits, low-fat dairy products, and lean protein.  Do not eat a lot of foods high in solid fats, added sugars, or salt.  Get regular exercise. This is one of the most important things you can do for your health.  Most adults should exercise for at least 150 minutes each week. The exercise should increase your heart rate and make you sweat (moderate-intensity exercise).  Most adults should also do strengthening exercises at least twice a week. This is in addition to the moderate-intensity exercise.  Maintain a healthy weight  Body mass index (BMI) is a measurement that can be used to identify possible weight problems. It estimates body fat based on height and weight. Your health care provider can help determine your BMI and help you achieve or maintain a healthy weight.  For females 34 years of age and older:   A BMI below 18.5 is considered underweight.  A BMI of 18.5 to 24.9 is normal.  A BMI of 25 to 29.9 is considered overweight.  A BMI of 30 and above is considered obese.  Watch levels of cholesterol and blood lipids  You should start having your blood tested for lipids and cholesterol at 37 years of age, then have this test every 5 years.  You may need to have your cholesterol levels checked more often if:  Your lipid or cholesterol levels are high.  You are older than 37 years of age.  You are at high risk for heart disease.  CANCER SCREENING   Lung Cancer  Lung  cancer screening is recommended for adults 30-22 years old who are at high risk for lung cancer because of a history of smoking.  A yearly low-dose CT scan of the lungs is recommended for people who:  Currently smoke.  Have quit within the past 15 years.  Have at least a 30-pack-year history of smoking. A pack year is smoking an average of one pack of cigarettes a day for 1 year.  Yearly screening should  continue until it has been 15 years since you quit.  Yearly screening should stop if you develop a health problem that would prevent you from having lung cancer treatment.  Breast Cancer  Practice breast self-awareness. This means understanding how your breasts normally appear and feel.  It also means doing regular breast self-exams. Let your health care provider know about any changes, no matter how small.  If you are in your 20s or 30s, you should have a clinical breast exam (CBE) by a health care provider every 1-3 years as part of a regular health exam.  If you are 62 or older, have a CBE every year. Also consider having a breast X-ray (mammogram) every year.  If you have a family history of breast cancer, talk to your health care provider about genetic screening.  If you are at high risk for breast cancer, talk to your health care provider about having an MRI and a mammogram every year.  Breast cancer gene (BRCA) assessment is recommended for women who have family members with BRCA-related cancers. BRCA-related cancers include:  Breast.  Ovarian.  Tubal.  Peritoneal cancers.  Results of the assessment will determine the need for genetic counseling and BRCA1 and BRCA2 testing. Cervical Cancer Your health care provider may recommend that you be screened regularly for cancer of the pelvic organs (ovaries, uterus, and vagina). This screening involves a pelvic examination, including checking for microscopic changes to the surface of your cervix (Pap test). You may be encouraged to have this screening done every 3 years, beginning at age 40.  For women ages 62-65, health care providers may recommend pelvic exams and Pap testing every 3 years, or they may recommend the Pap and pelvic exam, combined with testing for human papilloma virus (HPV), every 5 years. Some types of HPV increase your risk of cervical cancer. Testing for HPV may also be done on women of any age with unclear Pap  test results.  Other health care providers may not recommend any screening for nonpregnant women who are considered low risk for pelvic cancer and who do not have symptoms. Ask your health care provider if a screening pelvic exam is right for you.  If you have had past treatment for cervical cancer or a condition that could lead to cancer, you need Pap tests and screening for cancer for at least 20 years after your treatment. If Pap tests have been discontinued, your risk factors (such as having a new sexual partner) need to be reassessed to determine if screening should resume. Some women have medical problems that increase the chance of getting cervical cancer. In these cases, your health care provider may recommend more frequent screening and Pap tests. Colorectal Cancer  This type of cancer can be detected and often prevented.  Routine colorectal cancer screening usually begins at 37 years of age and continues through 37 years of age.  Your health care provider may recommend screening at an earlier age if you have risk factors for colon cancer.  Your health care provider  may also recommend using home test kits to check for hidden blood in the stool.  A small camera at the end of a tube can be used to examine your colon directly (sigmoidoscopy or colonoscopy). This is done to check for the earliest forms of colorectal cancer.  Routine screening usually begins at age 81.  Direct examination of the colon should be repeated every 5-10 years through 37 years of age. However, you may need to be screened more often if early forms of precancerous polyps or small growths are found. Skin Cancer  Check your skin from head to toe regularly.  Tell your health care provider about any new moles or changes in moles, especially if there is a change in a mole's shape or color.  Also tell your health care provider if you have a mole that is larger than the size of a pencil eraser.  Always use sunscreen.  Apply sunscreen liberally and repeatedly throughout the day.  Protect yourself by wearing long sleeves, pants, a wide-brimmed hat, and sunglasses whenever you are outside. HEART DISEASE, DIABETES, AND HIGH BLOOD PRESSURE   High blood pressure causes heart disease and increases the risk of stroke. High blood pressure is more likely to develop in:  People who have blood pressure in the high end of the normal range (130-139/85-89 mm Hg).  People who are overweight or obese.  People who are African American.  If you are 68-74 years of age, have your blood pressure checked every 3-5 years. If you are 61 years of age or older, have your blood pressure checked every year. You should have your blood pressure measured twice--once when you are at a hospital or clinic, and once when you are not at a hospital or clinic. Record the average of the two measurements. To check your blood pressure when you are not at a hospital or clinic, you can use:  An automated blood pressure machine at a pharmacy.  A home blood pressure monitor.  If you are between 75 years and 22 years old, ask your health care provider if you should take aspirin to prevent strokes.  Have regular diabetes screenings. This involves taking a blood sample to check your fasting blood sugar level.  If you are at a normal weight and have a low risk for diabetes, have this test once every three years after 37 years of age.  If you are overweight and have a high risk for diabetes, consider being tested at a younger age or more often. PREVENTING INFECTION  Hepatitis B  If you have a higher risk for hepatitis B, you should be screened for this virus. You are considered at high risk for hepatitis B if:  You were born in a country where hepatitis B is common. Ask your health care provider which countries are considered high risk.  Your parents were born in a high-risk country, and you have not been immunized against hepatitis B (hepatitis B  vaccine).  You have HIV or AIDS.  You use needles to inject street drugs.  You live with someone who has hepatitis B.  You have had sex with someone who has hepatitis B.  You get hemodialysis treatment.  You take certain medicines for conditions, including cancer, organ transplantation, and autoimmune conditions. Hepatitis C  Blood testing is recommended for:  Everyone born from [birth year] through 1963.  Anyone with known risk factors for hepatitis C. Sexually transmitted infections (STIs)  You should be screened for sexually transmitted infections (STIs) including  gonorrhea and chlamydia if:  You are sexually active and are younger than 37 years of age.  You are older than 37 years of age and your health care provider tells you that you are at risk for this type of infection.  Your sexual activity has changed since you were last screened and you are at an increased risk for chlamydia or gonorrhea. Ask your health care provider if you are at risk.  If you do not have HIV, but are at risk, it may be recommended that you take a prescription medicine daily to prevent HIV infection. This is called pre-exposure prophylaxis (PrEP). You are considered at risk if:  You are sexually active and do not regularly use condoms or know the HIV status of your partner(s).  You take drugs by injection.  You are sexually active with a partner who has HIV. Talk with your health care provider about whether you are at high risk of being infected with HIV. If you choose to begin PrEP, you should first be tested for HIV. You should then be tested every 3 months for as long as you are taking PrEP.  PREGNANCY   If you are premenopausal and you may become pregnant, ask your health care provider about preconception counseling.  If you may become pregnant, take 400 to 800 micrograms (mcg) of folic acid every day.  If you want to prevent pregnancy, talk to your health care provider about birth control  (contraception). OSTEOPOROSIS AND MENOPAUSE   Osteoporosis is a disease in which the bones lose minerals and strength with aging. This can result in serious bone fractures. Your risk for osteoporosis can be identified using a bone density scan.  If you are 7 years of age or older, or if you are at risk for osteoporosis and fractures, ask your health care provider if you should be screened.  Ask your health care provider whether you should take a calcium or vitamin D supplement to lower your risk for osteoporosis.  Menopause may have certain physical symptoms and risks.  Hormone replacement therapy may reduce some of these symptoms and risks. Talk to your health care provider about whether hormone replacement therapy is right for you.  HOME CARE INSTRUCTIONS   Schedule regular health, dental, and eye exams.  Stay current with your immunizations.   Do not use any tobacco products including cigarettes, chewing tobacco, or electronic cigarettes.  If you are pregnant, do not drink alcohol.  If you are breastfeeding, limit how much and how often you drink alcohol.  Limit alcohol intake to no more than 1 drink per day for nonpregnant women. One drink equals 12 ounces of beer, 5 ounces of wine, or 1 ounces of hard liquor.  Do not use street drugs.  Do not share needles.  Ask your health care provider for help if you need support or information about quitting drugs.  Tell your health care provider if you often feel depressed.  Tell your health care provider if you have ever been abused or do not feel safe at home.   This information is not intended to replace advice given to you by your health care provider. Make sure you discuss any questions you have with your health care provider.   Document Released: 12/14/2010 Document Revised: 06/21/2014 Document Reviewed: 05/02/2013 Elsevier Interactive Patient Education 2016 [employer]. Smoking Cessation, Tips for Success If you are  ready to quit smoking, congratulations! You have chosen to help yourself be healthier. Cigarettes bring nicotine, tar,  carbon monoxide, and other irritants into your body. Your lungs, heart, and blood vessels will be able to work better without these poisons. There are many different ways to quit smoking. Nicotine gum, nicotine patches, a nicotine inhaler, or nicotine nasal spray can help with physical craving. Hypnosis, support groups, and medicines help break the habit of smoking. WHAT THINGS CAN I DO TO MAKE QUITTING EASIER?  Here are some tips to help you quit for good:  Pick a date when you will quit smoking completely. Tell all of your friends and family about your plan to quit on that date.  Do not try to slowly cut down on the number of cigarettes you are smoking. Pick a quit date and quit smoking completely starting on that day.  Throw away all cigarettes.   Clean and remove all ashtrays from your home, work, and car.  On a card, write down your reasons for quitting. Carry the card with you and read it when you get the urge to smoke.  Cleanse your body of nicotine. Drink enough water and fluids to keep your urine clear or pale yellow. Do this after quitting to flush the nicotine from your body.  Learn to predict your moods. Do not let a bad situation be your excuse to have a cigarette. Some situations in your life might tempt you into wanting a cigarette.  Never have "just one" cigarette. It leads to wanting another and another. Remind yourself of your decision to quit.  Change habits associated with smoking. If you smoked while driving or when feeling stressed, try other activities to replace smoking. Stand up when drinking your coffee. Brush your teeth after eating. Sit in a different chair when you read the paper. Avoid alcohol while trying to quit, and try to drink fewer caffeinated beverages. Alcohol and caffeine may urge you to smoke.  Avoid foods and drinks that can trigger a  desire to smoke, such as sugary or spicy foods and alcohol.  Ask people who smoke not to smoke around you.  Have something planned to do right after eating or having a cup of coffee. For example, plan to take a walk or exercise.  Try a relaxation exercise to calm you down and decrease your stress. Remember, you may be tense and nervous for the first 2 weeks after you quit, but this will pass.  Find new activities to keep your hands busy. Play with a pen, coin, or rubber band. Doodle or draw things on paper.  Brush your teeth right after eating. This will help cut down on the craving for the taste of tobacco after meals. You can also try mouthwash.   Use oral substitutes in place of cigarettes. Try using lemon drops, carrots, cinnamon sticks, or chewing gum. Keep them handy so they are available when you have the urge to smoke.  When you have the urge to smoke, try deep breathing.  Designate your home as a nonsmoking area.  If you are a heavy smoker, ask your health care provider about a prescription for nicotine chewing gum. It can ease your withdrawal from nicotine.  Reward yourself. Set aside the cigarette money you save and buy yourself something nice.  Look for support from others. Join a support group or smoking cessation program. Ask someone at home or at work to help you with your plan to quit smoking.  Always ask yourself, "Do I need this cigarette or is this just a reflex?" Tell yourself, "Today, I choose not to  smoke," or "I do not want to smoke." You are reminding yourself of your decision to quit.  Do not replace cigarette smoking with electronic cigarettes (commonly called e-cigarettes). The safety of e-cigarettes is unknown, and some may contain harmful chemicals.  If you relapse, do not give up! Plan ahead and think about what you will do the next time you get the urge to smoke. HOW WILL I FEEL WHEN I QUIT SMOKING? You may have symptoms of withdrawal because your body is  used to nicotine (the addictive substance in cigarettes). You may crave cigarettes, be irritable, feel very hungry, cough often, get headaches, or have difficulty concentrating. The withdrawal symptoms are only temporary. They are strongest when you first quit but will go away within 10-14 days. When withdrawal symptoms occur, stay in control. Think about your reasons for quitting. Remind yourself that these are signs that your body is healing and getting used to being without cigarettes. Remember that withdrawal symptoms are easier to treat than the major diseases that smoking can cause.  Even after the withdrawal is over, expect periodic urges to smoke. However, these cravings are generally short lived and will go away whether you smoke or not. Do not smoke! WHAT RESOURCES ARE AVAILABLE TO HELP ME QUIT SMOKING? Your health care provider can direct you to community resources or hospitals for support, which may include:  Group support.  Education.  Hypnosis.  Therapy.   This information is not intended to replace advice given to you by your health care provider. Make sure you discuss any questions you have with your health care provider.   Document Released: 02/27/2004 Document Revised: 06/21/2014 Document Reviewed: 11/16/2012 Elsevier Interactive Patient Education [employer].

## 2015-05-02 NOTE — Assessment & Plan Note (Signed)
Patient is unfortunately not ready to quit; encouraged her to consider; see AVS; I am here to help if needed when she is ready

## 2015-05-02 NOTE — Assessment & Plan Note (Signed)
USPSTF grade A and B recommendations reviewed with patient; age-appropriate recommendations, preventive care, screening tests, etc discussed and encouraged; healthy living encouraged; see AVS for patient education given to patient  

## 2015-05-03 ENCOUNTER — Encounter: Payer: Self-pay | Admitting: Family Medicine

## 2015-05-03 LAB — CBC WITH DIFFERENTIAL/PLATELET
BASOS ABS: 0 10*3/uL (ref 0.0–0.2)
BASOS: 1 %
EOS (ABSOLUTE): 0.4 10*3/uL (ref 0.0–0.4)
Eos: 7 %
Hematocrit: 37.4 % (ref 34.0–46.6)
Hemoglobin: 13 g/dL (ref 11.1–15.9)
IMMATURE GRANS (ABS): 0 10*3/uL (ref 0.0–0.1)
IMMATURE GRANULOCYTES: 1 %
LYMPHS: 28 %
Lymphocytes Absolute: 1.8 10*3/uL (ref 0.7–3.1)
MCH: 32.2 pg (ref 26.6–33.0)
MCHC: 34.8 g/dL (ref 31.5–35.7)
MCV: 93 fL (ref 79–97)
MONOCYTES: 7 %
Monocytes Absolute: 0.5 10*3/uL (ref 0.1–0.9)
NEUTROS PCT: 56 %
Neutrophils Absolute: 3.7 10*3/uL (ref 1.4–7.0)
PLATELETS: 286 10*3/uL (ref 150–379)
RBC: 4.04 x10E6/uL (ref 3.77–5.28)
RDW: 13.4 % (ref 12.3–15.4)
WBC: 6.4 10*3/uL (ref 3.4–10.8)

## 2015-05-03 LAB — COMPREHENSIVE METABOLIC PANEL
ALK PHOS: 75 IU/L (ref 39–117)
ALT: 20 IU/L (ref 0–32)
AST: 16 IU/L (ref 0–40)
Albumin/Globulin Ratio: 1.9 (ref 1.1–2.5)
Albumin: 4.1 g/dL (ref 3.5–5.5)
BUN/Creatinine Ratio: 15 (ref 8–20)
BUN: 11 mg/dL (ref 6–20)
Bilirubin Total: 0.3 mg/dL (ref 0.0–1.2)
CO2: 25 mmol/L (ref 18–29)
Calcium: 9.1 mg/dL (ref 8.7–10.2)
Chloride: 99 mmol/L (ref 97–106)
Creatinine, Ser: 0.72 mg/dL (ref 0.57–1.00)
GFR calc Af Amer: 124 mL/min/{1.73_m2} (ref >59)
GFR calc non Af Amer: 107 mL/min/{1.73_m2} (ref >59)
GLOBULIN, TOTAL: 2.2 g/dL (ref 1.5–4.5)
Glucose: 90 mg/dL (ref 65–99)
POTASSIUM: 4.8 mmol/L (ref 3.5–5.2)
SODIUM: 138 mmol/L (ref 136–144)
Total Protein: 6.3 g/dL (ref 6.0–8.5)

## 2015-05-03 LAB — LIPID PANEL W/O CHOL/HDL RATIO
CHOLESTEROL TOTAL: 190 mg/dL (ref 100–199)
HDL: 54 mg/dL (ref >39)
LDL Calculated: 116 mg/dL — ABNORMAL HIGH (ref 0–99)
Triglycerides: 102 mg/dL (ref 0–149)
VLDL CHOLESTEROL CAL: 20 mg/dL (ref 5–40)

## 2015-05-03 LAB — TSH: TSH: 1.63 u[IU]/mL (ref 0.450–4.500)

## 2015-05-22 ENCOUNTER — Encounter: Payer: Self-pay | Admitting: Podiatry

## 2015-05-22 ENCOUNTER — Ambulatory Visit (INDEPENDENT_AMBULATORY_CARE_PROVIDER_SITE_OTHER): Payer: Medicaid Other | Admitting: Podiatry

## 2015-05-22 ENCOUNTER — Telehealth: Payer: Self-pay | Admitting: *Deleted

## 2015-05-22 VITALS — BP 145/96 | HR 114 | Resp 18

## 2015-05-22 DIAGNOSIS — S93699A Other sprain of unspecified foot, initial encounter: Secondary | ICD-10-CM

## 2015-05-22 DIAGNOSIS — M722 Plantar fascial fibromatosis: Secondary | ICD-10-CM | POA: Diagnosis not present

## 2015-05-22 NOTE — Progress Notes (Signed)
Patient ID: Holly French, female   DOB: [birth year], 39 y.o.   MRN: [id number]  Subjective:  39 year old female presents the office they for follow-up evaluation of right heel pain. She states that she continues to have heel pain, and it is getting worse. She is having pain go up the inside aspect of her ankle on the same side. She has continued with the CAM boot as much as possible. She stretches, ices, and takes NSAIDs. No recent injury or trauma. She does work 2 jobs on her feet quite a bit. No other complaints at this time.   Objective: General: AAO x3, NAD  Dermatological: Skin is warm, dry and supple bilateral. Nails x 10 are well manicured; remaining integument appears unremarkable at this time. There are no open sores, no preulcerative lesions, no rash or signs of infection present.  Vascular: Dorsalis Pedis artery and Posterior Tibial artery pedal pulses are 2/4 bilateral with immedate capillary fill time. Pedal hair growth present. No varicosities and no lower extremity edema present bilateral. There is no pain with calf compression, swelling, warmth, erythema.   Neruologic: Grossly intact via light touch bilateral. Vibratory intact via tuning fork bilateral. Protective threshold with Semmes Wienstein monofilament intact to all pedal sites bilateral. Negative tinel sign.   Musculoskeletal: No gross boney pedal deformities bilateral.  There is continued tenderness palpation upon the plantar medial tubercle of the calcaneus at the insertion the plantar fascia. There is no pain along the course the plantar fashion the plantar fascia appears to be intact. There is no pain with lateral compression or with vibratory sensation to the calcaneous. There is tenderness along the course of the posterior tibial tendon posterior, inferior to the medial malleolus and into the navicular tuberosity that started after the heel pain. No overlying edema, erythema, increase in warmth. No pain, crepitus, or  limitation noted with foot and ankle range of motion bilateral. Muscular strength 5/5 in all groups tested bilateral.  No other areas of tenderness to bilateral lower extremities.  Gait: Unassisted, Nonantalgic.   Assessment:  39 year old female with continuation of right heel pain, likely plantar fasciitis, ? Stress fracture   Plan: -Treatment options discussed including all alternatives, risks, and complications -Etiology of symptoms were discussed -At this time as symptoms continue, recommend MRI to rule out tear of the plantar fascia.  -Continue immobilization in a CAM boot. Continue stretching, icing. -Discussed steroid injection, but declined today.  -Ice and elevation. -Follow-up after MRI or sooner if any problems arise. In the meantime, encouraged to call the office with any questions, concerns, change in symptoms.   Ovid CurdMatthew Loron Weimer, DPM

## 2015-05-22 NOTE — Telephone Encounter (Addendum)
-----   Message from Vivi BarrackMatthew R Wagoner, DPM sent at 05/22/2015 10:05 AM EST ----- Can you order an MRI to rule out plantar fascia tear.  Orders and pt data faxed.  [employer]- [id number]- [id number], VALID 05/26/[id number]. FAXED TO [facility].

## 2015-05-26 ENCOUNTER — Telehealth: Payer: Self-pay | Admitting: Podiatry

## 2015-05-26 NOTE — Telephone Encounter (Signed)
Left voicemail to call office back to r/s

## 2015-06-05 ENCOUNTER — Encounter: Payer: Self-pay | Admitting: Podiatry

## 2015-06-05 ENCOUNTER — Ambulatory Visit (INDEPENDENT_AMBULATORY_CARE_PROVIDER_SITE_OTHER): Payer: Medicaid Other | Admitting: Podiatry

## 2015-06-05 VITALS — BP 121/82 | HR 104 | Resp 18

## 2015-06-05 DIAGNOSIS — M722 Plantar fascial fibromatosis: Secondary | ICD-10-CM

## 2015-06-05 NOTE — Progress Notes (Signed)
Patient ID: Holly French, female   DOB: [birth year], 39 y.o.   MRN: [id number]  Subjective: 39 year old female presents the office today for follow-up evaluation of right heel pain. She is requesting an injection at this time as she states that she is having to work a lot due to the holidays and she is having pain. She points to the plantar aspect of the heel on the inside arch where she is majority of her pain. She states it feels a constant throbbing sensation in this area as she is on her feet more. She does continue CAM boot. Denies any recent injury or trauma. No redness. No other complaints at this time.  Objective: AAO 3, NAD; presents CAM boot DP/PT pulses palpable, CRT less than 3 seconds Tenderness to palpation along the plantar medial tubercle of the calcaneus at the insertion of plantar fascia on the right foot. There is no pain along the course of the plantar fascia within the arch of the foot. Plantar fascia appears to be intact. There is no pain with lateral compression of the calcaneus or pain with vibratory sensation at today's appointment. There is no pain along the course or insertion of the achilles tendon. No other areas of tenderness to bilateral lower extremities. MMT 5/5, ROM WNL No open lesions or pre-ulcerative lesions. No pain with calf compression, swelling, warmth, erythema.  Assessment: 39 year old female presents the office if her right heel pain, likely plantar fasciitis  Plan: -Treatment options discussed including all alternatives, risks, and complications -Patient elects to proceed with steroid injection into the right heel. She states that she went to proceed with an injection. She understands the risks of the injection and wishes to proceed. Under sterile skin preparation, a total of 2.5cc of kenalog 10, 0.5% Marcaine plain, and 2% lidocaine plain were infiltrated into the symptomatic area without complication. A band-aid was applied. Patient tolerated the  injection well without complication. Post-injection care with discussed with the patient. Discussed with the patient to ice the area over the next couple of days to help prevent a steroid flare.  -Continuing to inflammatory's. -She finish the course of the oral steroid. She did not miss much difference -She is scheduled for an MRI beginning of January. I'll follow with her after the MRI. For now continue with CAM boot.  Ovid CurdMatthew Wagoner, DPM

## 2015-06-23 ENCOUNTER — Ambulatory Visit: Payer: Medicaid Other | Admitting: Unknown Physician Specialty

## 2015-06-24 ENCOUNTER — Encounter: Payer: Self-pay | Admitting: Unknown Physician Specialty

## 2015-06-24 ENCOUNTER — Ambulatory Visit
Admission: RE | Admit: 2015-06-24 | Discharge: 2015-06-24 | Disposition: A | Discharge status: Home / Self Care | Payer: Medicaid Other | Source: Ambulatory Visit | Attending: Podiatry | Admitting: Podiatry

## 2015-06-24 ENCOUNTER — Ambulatory Visit (INDEPENDENT_AMBULATORY_CARE_PROVIDER_SITE_OTHER): Payer: Medicaid Other | Admitting: Unknown Physician Specialty

## 2015-06-24 VITALS — BP 137/84 | HR 103 | Temp 98.1°F | Ht 63.5 in | Wt 203.2 lb

## 2015-06-24 DIAGNOSIS — J0100 Acute maxillary sinusitis, unspecified: Secondary | ICD-10-CM

## 2015-06-24 DIAGNOSIS — S93699A Other sprain of unspecified foot, initial encounter: Secondary | ICD-10-CM

## 2015-06-24 DIAGNOSIS — M629 Disorder of muscle, unspecified: Secondary | ICD-10-CM | POA: Diagnosis present

## 2015-06-24 MED ORDER — HYDROCOD POLST-CPM POLST ER 10-8 MG/5ML PO SUER: 5.0000 mL | Freq: Two times a day (BID) | ORAL | Status: DC | PRN

## 2015-06-24 MED ORDER — AMOXICILLIN-POT CLAVULANATE 875-125 MG PO TABS: 1.0000 | ORAL_TABLET | Freq: Two times a day (BID) | ORAL | Status: DC

## 2015-06-24 NOTE — Progress Notes (Signed)
BP 137/84 mmHg  Pulse 103  Temp(Src) 98.1 F (36.7 C)  Ht 5' 3.5" (1.613 m)  Wt 203 lb 3.2 oz (92.171 kg)  BMI 35.43 kg/m2  SpO2 98%  LMP 02/25/2009 (Approximate)   Subjective:    Patient ID: Holly French, female    DOB: 11/06/1975, 40 y.o.   MRN: [id number]  HPI: Holly French is a 40 y.o. female  Chief Complaint  Patient presents with  . URI    pt states she has been having nasal congestion, headache, drainage, and cough for almost 2 weeks now   Sinusitis This is a new problem. The current episode started 1 to 4 weeks ago. The problem has been gradually worsening since onset. There has been no fever. Associated symptoms include congestion, coughing, headaches, sinus pressure and a sore throat.    Relevant past medical, surgical, family and social history reviewed and updated as indicated. Interim medical history since our last visit reviewed. Allergies and medications reviewed and updated.  Review of Systems  HENT: Positive for congestion, sinus pressure and sore throat.   Respiratory: Positive for cough.   Neurological: Positive for headaches.    Per HPI unless specifically indicated above     Objective:    BP 137/84 mmHg  Pulse 103  Temp(Src) 98.1 F (36.7 C)  Ht 5' 3.5" (1.613 m)  Wt 203 lb 3.2 oz (92.171 kg)  BMI 35.43 kg/m2  SpO2 98%  LMP 02/25/2009 (Approximate)  Wt Readings from Last 3 Encounters:  06/24/15 203 lb 3.2 oz (92.171 kg)  05/02/15 202 lb (91.627 kg)  04/11/15 200 lb 6.4 oz (90.901 kg)    Physical Exam  Constitutional: She is oriented to person, place, and time. She appears well-developed and well-nourished. No distress.  HENT:  Head: Normocephalic and atraumatic.  Right Ear: Tympanic membrane and ear canal normal.  Left Ear: Tympanic membrane and ear canal normal.  Nose: No rhinorrhea. Right sinus exhibits maxillary sinus tenderness. Right sinus exhibits no frontal sinus tenderness. Left sinus exhibits maxillary sinus tenderness.  Left sinus exhibits no frontal sinus tenderness.  Eyes: Conjunctivae and lids are normal. Right eye exhibits no discharge. Left eye exhibits no discharge. No scleral icterus.  Cardiovascular: Normal rate and regular rhythm.   Pulmonary/Chest: Effort normal and breath sounds normal. No respiratory distress.  Abdominal: Normal appearance. There is no splenomegaly or hepatomegaly.  Musculoskeletal: Normal range of motion.  Neurological: She is alert and oriented to person, place, and time.  Skin: Skin is intact. No rash noted. No pallor.  Psychiatric: She has a normal mood and affect. Her behavior is normal. Judgment and thought content normal.    Results for orders placed or performed in visit on 05/02/15  CBC with Differential/Platelet  Result Value Ref Range   WBC 6.4 3.4 - 10.8 x10E3/uL   RBC 4.04 3.77 - 5.28 x10E6/uL   Hemoglobin 13.0 11.1 - 15.9 g/dL   Hematocrit 13.2 44.0 - 46.6 %   MCV 93 79 - 97 fL   MCH 32.2 26.6 - 33.0 pg   MCHC 34.8 31.5 - 35.7 g/dL   RDW 10.2 72.5 - 36.6 %   Platelets 286 150 - 379 x10E3/uL   Neutrophils 56 %   Lymphs 28 %   Monocytes 7 %   Eos 7 %   Basos 1 %   Neutrophils Absolute 3.7 1.4 - 7.0 x10E3/uL   Lymphocytes Absolute 1.8 0.7 - 3.1 x10E3/uL   Monocytes Absolute 0.5 0.1 - 0.9  x10E3/uL   EOS (ABSOLUTE) 0.4 0.0 - 0.4 x10E3/uL   Basophils Absolute 0.0 0.0 - 0.2 x10E3/uL   Immature Granulocytes 1 %   Immature Grans (Abs) 0.0 0.0 - 0.1 x10E3/uL  Lipid Panel w/o Chol/HDL Ratio  Result Value Ref Range   Cholesterol, Total 190 100 - 199 mg/dL   Triglycerides 846102 0 - 149 mg/dL   HDL 54 >96>39 mg/dL   VLDL Cholesterol Cal 20 5 - 40 mg/dL   LDL Calculated 295116 (H) 0 - 99 mg/dL  Comprehensive metabolic panel  Result Value Ref Range   Glucose 90 65 - 99 mg/dL   BUN 11 6 - 20 mg/dL   Creatinine, Ser 2.840.72 0.57 - 1.00 mg/dL   GFR calc non Af Amer 107 >59 mL/min/1.73   GFR calc Af Amer 124 >59 mL/min/1.73   BUN/Creatinine Ratio 15 8 - 20   Sodium 138  136 - 144 mmol/L   Potassium 4.8 3.5 - 5.2 mmol/L   Chloride 99 97 - 106 mmol/L   CO2 25 18 - 29 mmol/L   Calcium 9.1 8.7 - 10.2 mg/dL   Total Protein 6.3 6.0 - 8.5 g/dL   Albumin 4.1 3.5 - 5.5 g/dL   Globulin, Total 2.2 1.5 - 4.5 g/dL   Albumin/Globulin Ratio 1.9 1.1 - 2.5   Bilirubin Total 0.3 0.0 - 1.2 mg/dL   Alkaline Phosphatase 75 39 - 117 IU/L   AST 16 0 - 40 IU/L   ALT 20 0 - 32 IU/L  TSH  Result Value Ref Range   TSH 1.630 0.450 - 4.500 uIU/mL      Assessment & Plan:   Problem List Items Addressed This Visit    None    Visit Diagnoses    Acute maxillary sinusitis, recurrence not specified    -  Primary    Relevant Medications    amoxicillin-clavulanate (AUGMENTIN) 875-125 MG tablet    chlorpheniramine-HYDROcodone (TUSSIONEX PENNKINETIC ER) 10-8 MG/5ML SUER        Follow up plan: Return if symptoms worsen or fail to improve.

## 2015-07-01 ENCOUNTER — Ambulatory Visit (INDEPENDENT_AMBULATORY_CARE_PROVIDER_SITE_OTHER): Payer: Medicaid Other | Admitting: Podiatry

## 2015-07-01 ENCOUNTER — Encounter: Payer: Self-pay | Admitting: Podiatry

## 2015-07-01 VITALS — BP 131/80 | HR 105 | Resp 18

## 2015-07-01 DIAGNOSIS — M722 Plantar fascial fibromatosis: Secondary | ICD-10-CM | POA: Diagnosis not present

## 2015-07-01 DIAGNOSIS — M79671 Pain in right foot: Secondary | ICD-10-CM

## 2015-07-01 MED ORDER — METHYLPREDNISOLONE 4 MG PO TBPK: ORAL_TABLET | ORAL | Status: DC

## 2015-07-01 NOTE — Patient Instructions (Signed)

## 2015-07-06 NOTE — Progress Notes (Signed)
Patient ID: Holly French, female   DOB: 14-Feb-1976, 40 y.o.   MRN: [id number]  Subjective: 40 year old female presents the office today for follow-up evaluation of right heel pain and to discuss MRI results. She states that she continue to get pain to the bottom of her right heel. She states that the pain is worse as she stands for long time at work. The injections previously at health. No recent injury or trauma. No swelling or redness. No tingling or numbness. No other complaints at this time in no acute changes.  Objective: AAO 3, NAD; presents CAM boot DP/PT pulses palpable, CRT less than 3 seconds Negative Tinel sign. Tenderness to palpation along the plantar medial tubercle of the calcaneus at the insertion of plantar fascia on the right foot. There is no pain along the course of the plantar fascia within the arch of the foot. Plantar fascia appears to be intact. There is no pain with lateral compression of the calcaneus or pain with vibratory sensation at today's appointment. There is no pain along the course or insertion of the achilles tendon. No other areas of tenderness to bilateral lower extremities. MMT 5/5, ROM WNL No open lesions or pre-ulcerative lesions. No pain with calf compression, swelling, warmth, erythema.  Assessment: 40 year old female presents to the office for follow-up evaluation of right heel pain and did discuss MRI results  Plan: -Treatment options discussed including all alternatives, risks, and complications -MRI was discussed the patient which was overall negative. Negative upon a fasciitis. Although given her symptoms I still think this is causing her pain. -Patient elects to proceed with steroid injection into the right heel. She states that she went to proceed with an injection. She understands the risks of the injection and wishes to proceed. Under sterile skin preparation, a total of 2.5cc of kenalog 10, 0.5% Marcaine plain, and 2% lidocaine plain were  infiltrated into the symptomatic area without complication. A band-aid was applied. Patient tolerated the injection well without complication. Post-injection care with discussed with the patient. Discussed with the patient to ice the area over the next couple of days to help prevent a steroid flare.  -Anti-inflammatories as needed -Discussed custom orthotics as this will hopefully help as her pain is worse with standing for long periods of time at work. -She has her to transition to a regular shoe as tolerated. If the boot is helping more continue with CAM boot. Continue with the cam boot at night. -Follow-up as scheduled or sooner if any problems arise. In the meantime, encouraged to call the office with any questions, concerns, change in symptoms.    Ovid Curd, DPM

## 2015-07-08 ENCOUNTER — Other Ambulatory Visit: Payer: Self-pay | Admitting: Unknown Physician Specialty

## 2015-08-12 ENCOUNTER — Telehealth: Payer: Self-pay | Admitting: *Deleted

## 2015-08-12 ENCOUNTER — Ambulatory Visit (INDEPENDENT_AMBULATORY_CARE_PROVIDER_SITE_OTHER): Payer: Medicaid Other | Admitting: Podiatry

## 2015-08-12 ENCOUNTER — Encounter: Payer: Self-pay | Admitting: Podiatry

## 2015-08-12 DIAGNOSIS — M722 Plantar fascial fibromatosis: Secondary | ICD-10-CM | POA: Diagnosis not present

## 2015-08-12 DIAGNOSIS — R200 Anesthesia of skin: Secondary | ICD-10-CM

## 2015-08-12 DIAGNOSIS — G5751 Tarsal tunnel syndrome, right lower limb: Secondary | ICD-10-CM

## 2015-08-12 NOTE — Progress Notes (Signed)
Patient ID: GAYLON MELCHOR, female   DOB: 1975-07-29, 40 y.o.   MRN: [id number]  Subjective: 40 year old female presents the office today for follow-up evaluation of right heel pain.  She states that she continues to have pain to her foot. She said that she strained Moore and numbness continuing pain to her entire foot. 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 This been no recent injury or trauma. She states that when she is off of her feet the symptoms are relieved. No swelling or redness. No other complaints at this time. The pain does not wake her up at night.  Objective: AAO 3, NAD; presents CAM boot DP/PT pulses palpable, CRT less than 3 seconds Today there is a positive Tinel sign on the right side. Tenderness to palpation along the plantar medial tubercle of the calcaneus at the insertion of plantar fascia on the right foot.  There is no other areas of tenderness to bilateral lower extremities. There is no pain with lateral compression of the calcaneus. There is edema, erythema, increase in warmth bilaterally. MMT 5/5, ROM WNL No open lesions or pre-ulcerative lesions. No pain with calf compression, swelling, warmth, erythema.  Assessment: 40 year old female presents to the office for follow-up evaluation of right heel pain,  Likely neurological origin.  Plan: -Treatment options discussed including all alternatives, risks, and complications -States she discovers more the numbness and tingling to her feet. She also has a positive Tinel's on the right side. MRI findings were overall were negative. She does have a history of a lumbar fusion as well. She believes that this is L4-L5 fusion. Given her symptoms I would like to go ahead and order nerve conduction velocity test to evaluate for possible tarsal tunnel. I discussed with her options for treatment however we will await the results the talus. Also discussed with her to follow-up with her  pain management, neurosurgeon. -Follow-up after NCV or sooner if any problems arise. In the meantime, encouraged to call the office with any questions, concerns, change in symptoms.    Ovid Curd, DPM

## 2015-08-12 NOTE — Telephone Encounter (Addendum)
-----   Message from Vivi Barrack, DPM sent at 08/12/2015 10:05 AM EST ----- Can you please order a ncv for her? Rule out tarsal tunnel. H/O lumbar fusion; numbness to right foot   08/12/2015-UNABLE TO GET NCV AND EMG prior authorized through [facility], [address] CPT brings up prompt "not a valid CPT code".  Informed Dr. Ardelle Anton, he states pt is going to pain management doctor next week, have pt ask them to order NCV and EMG.  Informed pt of the inability to get coverage from [facility] for the NCV and EMG, and she will ask the pain management doctor and send results to our office.

## 2015-09-18 ENCOUNTER — Encounter: Payer: Self-pay | Admitting: Podiatry

## 2015-09-18 ENCOUNTER — Ambulatory Visit (INDEPENDENT_AMBULATORY_CARE_PROVIDER_SITE_OTHER): Payer: Medicaid Other | Admitting: Podiatry

## 2015-09-18 VITALS — BP 138/87 | HR 97 | Resp 18

## 2015-09-18 DIAGNOSIS — M722 Plantar fascial fibromatosis: Secondary | ICD-10-CM | POA: Diagnosis not present

## 2015-09-18 DIAGNOSIS — M792 Neuralgia and neuritis, unspecified: Secondary | ICD-10-CM

## 2015-09-18 MED ORDER — HYDROCODONE-ACETAMINOPHEN 5-325 MG PO TABS: 1.0000 | ORAL_TABLET | Freq: Four times a day (QID) | ORAL | Status: DC | PRN

## 2015-09-18 NOTE — Progress Notes (Signed)
Patient ID: Holly French, female   DOB: 1975/08/03, 40 y.o.   MRN: [id number]  Subjective: 40 year old female presents the office today for follow-up evaluation of right heel pain and to discuss EMG findings.  She points to an area just on the plantar aspect of the foot over localized area of swelling which she gets the majority of her pain. She states that she's having difficulty on any shoes that she can wear without putting any pressure on her foot. She has followed up with neurosurgery and she is going for a another injection in her back next week. She, denies any numbness or tingling to her feet other than the area of stabbing pain that she describes on the bottom of the right foot.  Objective: AAO 3, NAD; presents CAM boot DP/PT pulses palpable, CRT less than 3 seconds Today there is a today there is a negative Tinel sign however has been positive previously. Tenderness just distal to  the plantar medial tubercle of the calcaneus at the insertion of plantar fascia on the right foot.  There is no other areas of tenderness to bilateral lower extremities. Plantar fascia appears to be intact. There is no pain with lateral compression of the calcaneus. There is edema, erythema, increase in warmth bilaterally. MMT 5/5, ROM WNL No open lesions or pre-ulcerative lesions. No pain with calf compression, swelling, warmth, erythema.  Assessment: 40 year old female presents to the office for follow-up evaluation of right heel pain,  possibly neurological origin.  Plan: -Treatment options discussed including all alternatives, risks, and complications -Given that she has localized swelling and pain to the area just distal to the calcaneal tubercle on the medial band plantar fasciitis steroid injection. Mixture of Kenalog as well as local anesthetic was infiltrated into the area of maximal tenderness without complications. Post injection care discussed. Plantar fascial taping was applied for support. Due  to the patient's acid for other pain medicine. She's had a double up on her prescribed medicine. She presents been on Norco for breakthrough. Ago had an prescribed this for her. She does see pain management that she cannot be seen for some time. She must help him that she is taking this medicine. -I discussed possible tarsal tunnel release however she does not have symptoms today consistent with tarsal tunnel. -Follow-up with neurosurgery for an injection next week. -Follow-up in 2 weeks or sooner if any problems arise. In the meantime, encouraged to call the office with any questions, concerns, change in symptoms.  Ovid CurdMatthew Jelani Vreeland, DPM

## 2015-10-02 ENCOUNTER — Ambulatory Visit (INDEPENDENT_AMBULATORY_CARE_PROVIDER_SITE_OTHER): Payer: Medicaid Other | Admitting: Podiatry

## 2015-10-02 ENCOUNTER — Encounter: Payer: Self-pay | Admitting: Podiatry

## 2015-10-02 VITALS — BP 139/86 | HR 109 | Resp 18

## 2015-10-02 DIAGNOSIS — G5751 Tarsal tunnel syndrome, right lower limb: Secondary | ICD-10-CM | POA: Diagnosis not present

## 2015-10-02 DIAGNOSIS — M722 Plantar fascial fibromatosis: Secondary | ICD-10-CM

## 2015-10-02 DIAGNOSIS — M779 Enthesopathy, unspecified: Secondary | ICD-10-CM

## 2015-10-02 DIAGNOSIS — M79673 Pain in unspecified foot: Secondary | ICD-10-CM

## 2015-10-02 NOTE — Progress Notes (Signed)
Patient ID: Holly French, female   DOB: 08-22-1975, 40 y.o.   MRN: [id number]   Subjective: 40 year old female presents the office today for follow-up evaluation of right heel pain. To reschedule her spinal injection due to blood pressure issues. Her heels somewhat better after the injection lasted for now she does continue to have quite a bit of pain in her symptoms are mostly unchanged. No other acute changes since last appointment and no other complaints at this time.  Objective: AAO 3, NAD; presents CAM boot DP/PT pulses palpable, CRT less than 3 seconds Today there is a today there is a negative Tinel sign however has been positive previously. Tenderness continues just distal to  the plantar medial tubercle of the calcaneus at the insertion of plantar fascia on the right foot.  There is also tenderness on the medial aspect of the ankle course the flexor tendons. Tendons appear to be intact. There is no other areas of tenderness to bilateral lower extremities. Plantar fascia appears to be intact. There is no pain with lateral compression of the calcaneus. There is edema, erythema, increase in warmth bilaterally. MMT 5/5, ROM WNL No open lesions or pre-ulcerative lesions. No pain with calf compression, swelling, warmth, erythema.  Assessment: 40 year old female presents to the office for follow-up evaluation of right heel pain/medial ankle pain  possibly neurological origin  Plan: -Treatment options discussed including all alternatives, risks, and complications -At this point given the medial ankle tenderness as could be resulted tendinitis versus tarsal tunnel. Dispensed ankle brace to help support the ankle. Also she is scheduled to have her steroid injection in her back tomorrow. Will see how this does with the foot. Also get a second opinion on the MRI. I may have her come back for second opinion with another physician in the practice as well. Follow-up in 7-10 days or sooner if any  issues are to arise. Call any questions or concerns in the meantime.  Ovid CurdMatthew Wagoner, DPM

## 2015-10-14 ENCOUNTER — Ambulatory Visit (INDEPENDENT_AMBULATORY_CARE_PROVIDER_SITE_OTHER): Payer: Medicaid Other | Admitting: Podiatry

## 2015-10-14 ENCOUNTER — Encounter: Payer: Self-pay | Admitting: Podiatry

## 2015-10-14 VITALS — BP 122/65 | HR 102 | Resp 18

## 2015-10-14 DIAGNOSIS — M792 Neuralgia and neuritis, unspecified: Secondary | ICD-10-CM

## 2015-10-14 DIAGNOSIS — M722 Plantar fascial fibromatosis: Secondary | ICD-10-CM

## 2015-10-14 MED ORDER — HYDROCODONE-ACETAMINOPHEN 5-325 MG PO TABS: 1.0000 | ORAL_TABLET | Freq: Four times a day (QID) | ORAL | Status: DC | PRN

## 2015-10-18 NOTE — Progress Notes (Signed)
Patient ID: Holly French, female   DOB: August 29, 1975, 40 y.o.   MRN: [id number]  Subjective: 40 year old female presents the office today for follow-up evaluation of right heel pain.she feels that her heels are actually improved somewhat since last appointment she does continue to have pain to the bottom of her heel into the arch of the foot. She is continued with the stretching, icing, supportive shoe gear as much as possible. She's been wearing the brace which also helps except for when she does a lot of running around at work. No other acute changes since last appointment and no other complaints at this time.  Objective: AAO 3, NAD; presents CAM boot DP/PT pulses palpable, CRT less than 3 seconds Today there is a today there is a negative Tinel sign however has been positive previously. Tenderness continues just distal to  the plantar medial tubercle of the calcaneus at the insertion of plantar fascia on the right foot.  There is also tenderness on the medial aspect of the ankle course the flexor tendons out of this appears to be improved. Tendons appear to be intact. There is no other areas of tenderness to bilateral lower extremities. Plantar fascia appears to be intact. There is no pain with lateral compression of the calcaneus. There is edema, erythema, increase in warmth bilaterally. MMT 5/5, ROM WNL No open lesions or pre-ulcerative lesions. No pain with calf compression, swelling, warmth, erythema.  Assessment: 40 year old female presents to the office for follow-up evaluation of right heel pain  Plan: -Treatment options discussed including all alternatives, risks, and complications -At this point I discussed multiple treatment options the patient. After long discussion with her ability to go and try EPAT for her. I'll have her come in next week for her first treatment. I discussed this treatment as well as the outcomes and risks. Tenderness in this wishes to proceed.   Ovid CurdMatthew French,  DPM

## 2015-10-21 ENCOUNTER — Ambulatory Visit (INDEPENDENT_AMBULATORY_CARE_PROVIDER_SITE_OTHER): Payer: Medicaid Other | Admitting: Podiatry

## 2015-10-21 DIAGNOSIS — M722 Plantar fascial fibromatosis: Secondary | ICD-10-CM

## 2015-10-21 NOTE — Progress Notes (Signed)
Patient ID: Holly French, female   DOB: 1975-11-25, 40 y.o.   MRN: [id number]  Subjective: Patient presents today for EPAT #1 treatment. No acute changes since last appointment. No new complaints and the pain is about the same.  Denies any systemic complaints such as fevers, chills, nausea, vomiting. No acute changes since last appointment, and no other complaints at this time.   Objective: AAO x3, NAD DP/PT pulses palpable bilaterally, CRT less than 3 seconds Protective sensation intact with Simms Weinstein monofilament, negative tinel sign Tenderness to palpation along the plantar medial tubercle of the calcaneus at the insertion of plantar fascia on the right foot. There is no pain along the course of the plantar fascia within the arch of the foot. Plantar fascia appears to be intact. There is no pain with lateral compression of the calcaneus or pain with vibratory sensation. There is no pain along the course or insertion of the achilles tendon. No other areas of tenderness to bilateral lower extremities. No other areas of pinpoint bony tenderness or pain with vibratory sensation. MMT 5/5, ROM WNL. No edema, erythema, increase in warmth to bilateral lower extremities.  No open lesions or pre-ulcerative lesions.  No pain with calf compression, swelling, warmth, erythema  Assessment: 40 year old female chronic right heel pain, plantar fasciitis  Plan: -All treatment options discussed with the patient including all alternatives, risks, complications.  -Discussed EPAT and no guarantees given. She would like to proceed. [place] in her first treatment today without complications. The right heel setting the 2, 3000, 21 and the right arch 2, 1000, 10. Hold off on any anti-inflammatories. Continue with stretching, bracing, she changes. -Follow-up 1 week -Patient encouraged to call the office with any questions, concerns, change in symptoms.   Ovid CurdMatthew Wagoner, DPM

## 2015-10-28 ENCOUNTER — Encounter: Payer: Self-pay | Admitting: Podiatry

## 2015-10-28 ENCOUNTER — Ambulatory Visit (INDEPENDENT_AMBULATORY_CARE_PROVIDER_SITE_OTHER): Payer: Medicaid Other | Admitting: Podiatry

## 2015-10-28 VITALS — BP 138/94 | HR 97 | Resp 18

## 2015-10-28 DIAGNOSIS — M722 Plantar fascial fibromatosis: Secondary | ICD-10-CM | POA: Diagnosis not present

## 2015-10-28 NOTE — Progress Notes (Signed)
Patient ID: Holly RousLaura M Delrosario, female   DOB: Jul 02, 1975, 40 y.o.   MRN: [id number]  Subjective: Patient presents today for EPAT #2  treatment. No acute changes since last appointment. No new complaints and the pain is about the same.  She is getting some pain still to the bottom of the heel as well as some throbbing sensation to the inside aspect of her ankle but denies any numbness or tingling at this time.  Denies any systemic complaints such as fevers, chills, nausea, vomiting. No acute changes since last appointment, and no other complaints at this time.   Objective: AAO x3, NAD DP/PT pulses palpable bilaterally, CRT less than 3 seconds Protective sensation intact with Simms Weinstein monofilament, negative tinel sign Tenderness to palpation along the plantar medial tubercle of the calcaneus at the insertion of plantar fascia on the right foot. There is no pain along the course of the plantar fascia within the arch of the foot. Plantar fascia appears to be intact. There is no pain with lateral compression of the calcaneus or pain with vibratory sensation. There is no pain along the course or insertion of the achilles tendon. Some mild discomfort along the flexor tendons. No other areas of tenderness to bilateral lower extremities. No edema, erythema, increase in warmth to bilateral lower extremities.  No open lesions or pre-ulcerative lesions.  No pain with calf compression, swelling, warmth, erythema  Assessment: 40 year old female chronic right heel pain, plantar fasciitis  Plan: -All treatment options discussed with the patient including all alternatives, risks, complications.  -Discussed EPAT and no guarantees given. She would like to proceed. She underwent her 2nd treatment today without complications. The right heel settings of 3, 3000, 21 and in the arch 2.2, 1000, 12 without complications. Hold off on anti-inflammatories and ice. CAM boot as needed. Stretching.  -Follow-up 1  week -Patient encouraged to call the office with any questions, concerns, change in symptoms.   Ovid CurdMatthew Wagoner, DPM

## 2015-11-04 ENCOUNTER — Ambulatory Visit (INDEPENDENT_AMBULATORY_CARE_PROVIDER_SITE_OTHER): Payer: Medicaid Other | Admitting: Podiatry

## 2015-11-04 DIAGNOSIS — M722 Plantar fascial fibromatosis: Secondary | ICD-10-CM | POA: Diagnosis not present

## 2015-11-06 NOTE — Progress Notes (Signed)
Patient ID: Holly RousLaura M French, female   DOB: 1975/08/24, 40 y.o.   MRN: [id number]  Subjective: Patient presents today for EPAT #3  treatment. No acute changes since last appointment. She states her feet still hurt as she is on them all the time. The majority pain is the bottom of the heel. Denies any systemic complaints such as fevers, chills, nausea, vomiting. No acute changes since last appointment, and no other complaints at this time.   Objective: AAO x3, NAD DP/PT pulses palpable bilaterally, CRT less than 3 seconds Negative tinel sign There is continuation of tenderness to palpation along the plantar medial tubercle of the calcaneus at the insertion of plantar fascia on the right foot. There is no pain along the course of the plantar fascia within the arch of the foot. Plantar fascia appears to be intact. There is no pain with lateral compression of the calcaneus or pain with vibratory sensation. There is no pain along the course or insertion of the achilles tendon. Some mild discomfort along the flexor tendons. No other areas of tenderness to bilateral lower extremities. No edema, erythema, increase in warmth to bilateral lower extremities.  No open lesions or pre-ulcerative lesions.  No pain with calf compression, swelling, warmth, erythema  Assessment: 40 year old female chronic right heel pain, plantar fasciitis  Plan: -All treatment options discussed with the patient including all alternatives, risks, complications.  -Discussed EPAT and no guarantees given. She would like to proceed. She underwent her 3rd  treatment today without complications. The right heel settings of 3.9, 3000, 21 and in the arch 2.5, 1000, 14 without complications. Hold off on anti-inflammatories and ice. CAM boot as needed. Stretching.  -Follow-up 4 weeks or sooner -Patient encouraged to call the office with any questions, concerns, change in symptoms.   Ovid CurdMatthew Wagoner, DPM

## 2015-11-12 ENCOUNTER — Telehealth: Payer: Self-pay | Admitting: *Deleted

## 2015-11-12 NOTE — Telephone Encounter (Addendum)
Pt states she is having a lot of pain after her EPAT, which means she can't take antiinflammatories, so what can she do?  Dr. Ardelle AntonWagoner states can take OTC Ibuprofen as package directs for short period of time.  Left message with instructions. 12/03/2015-Left message informing pt Dr. Ardelle AntonWagoner was sending the MRI disc copy for a more indepth reading, there would be a delay, once results received I would call with instructions. 01/09/2016-DrArdelle Anton. Wagoner states his Monday schedule needs to have the pt's surgery rescheduled to 01/14/2016. Left message for pt to call concerning her 01/12/2016 surgery.Pt states she can not change surgery to 01/14/2016, pt states it something to do with her income and insurance may not cover it after 01/12/2016. 01/12/2016-Dr. Ardelle AntonWagoner states he wanted to be certain pt had her 1st POV appt.  I left message informing pt of the 01/20/2016 0900am appt.

## 2015-11-12 NOTE — Telephone Encounter (Signed)
If she has to take an NSAID she can but not long term. Ice. Cam boot

## 2015-11-17 ENCOUNTER — Encounter: Payer: Self-pay | Admitting: Family Medicine

## 2015-11-17 ENCOUNTER — Ambulatory Visit (INDEPENDENT_AMBULATORY_CARE_PROVIDER_SITE_OTHER): Payer: Medicaid Other | Admitting: Family Medicine

## 2015-11-17 VITALS — BP 112/74 | HR 101 | Temp 98.3°F | Wt 213.0 lb

## 2015-11-17 DIAGNOSIS — L578 Other skin changes due to chronic exposure to nonionizing radiation: Secondary | ICD-10-CM

## 2015-11-17 DIAGNOSIS — J019 Acute sinusitis, unspecified: Secondary | ICD-10-CM | POA: Diagnosis not present

## 2015-11-17 MED ORDER — TRIAMCINOLONE ACETONIDE 0.1 % EX CREA: 1.0000 "application " | TOPICAL_CREAM | Freq: Two times a day (BID) | CUTANEOUS | Status: DC

## 2015-11-17 MED ORDER — AMOXICILLIN-POT CLAVULANATE 875-125 MG PO TABS: 1.0000 | ORAL_TABLET | Freq: Two times a day (BID) | ORAL | Status: DC

## 2015-11-17 NOTE — Progress Notes (Signed)
BP 112/74 mmHg  Pulse 101  Temp(Src) 98.3 F (36.8 C)  Ht   Wt 213 lb (96.616 kg)  SpO2 96%  LMP 02/25/2009 (Approximate)   Subjective:    Patient ID: Holly RousLaura M Buege, female    DOB: 03/31/1976, 40 y.o.   MRN: [id number]  HPI: Holly French is a 40 y.o. female  Chief Complaint  Patient presents with  . Rash    hands and arm  . URI   She with sinus pressure congestion especially with bending over and no fever this most systemic symptoms of just feeling bad Right NyQuil and Delsym use Xanax Tylenol Sinus and still feeling bad this been ongoing for 10+ days and getting worse. Patient also with new house with an older water system has developed rash around her neck and an antecubital spaces no other rashes identified Relevant past medical, surgical, family and social history reviewed and updated as indicated. Interim medical history since our last visit reviewed. Allergies and medications reviewed and updated.  Review of Systems  Constitutional: Positive for chills and fatigue. Negative for fever.  HENT: Positive for congestion, postnasal drip, rhinorrhea, sinus pressure, sneezing and sore throat.   Respiratory: Negative.   Cardiovascular: Negative.     Per HPI unless specifically indicated above     Objective:    BP 112/74 mmHg  Pulse 101  Temp(Src) 98.3 F (36.8 C)  Ht   Wt 213 lb (96.616 kg)  SpO2 96%  LMP 02/25/2009 (Approximate)  Wt Readings from Last 3 Encounters:  11/17/15 213 lb (96.616 kg)  06/24/15 203 lb 3.2 oz (92.171 kg)  05/02/15 202 lb (91.627 kg)    Physical Exam  Constitutional: She is oriented to person, place, and time. She appears well-developed and well-nourished. No distress.  HENT:  Head: Normocephalic and atraumatic.  Right Ear: Hearing and external ear normal.  Left Ear: Hearing and external ear normal.  Nose: Nose normal.  Mouth/Throat: Oropharyngeal exudate present.  Eyes: Conjunctivae and lids are normal. Right eye exhibits no  discharge. Left eye exhibits no discharge. No scleral icterus.  Pulmonary/Chest: Effort normal. No respiratory distress.  Musculoskeletal: Normal range of motion.  Neurological: She is alert and oriented to person, place, and time.  Skin: Skin is intact. No rash noted.  Solar dermatitis changes both antecubital spaces and around her neck  Psychiatric: She has a normal mood and affect. Her speech is normal and behavior is normal. Judgment and thought content normal. Cognition and memory are normal.    Results for orders placed or performed in visit on 05/02/15  CBC with Differential/Platelet  Result Value Ref Range   WBC 6.4 3.4 - 10.8 x10E3/uL   RBC 4.04 3.77 - 5.28 x10E6/uL   Hemoglobin 13.0 11.1 - 15.9 g/dL   Hematocrit 40.937.4 81.134.0 - 46.6 %   MCV 93 79 - 97 fL   MCH 32.2 26.6 - 33.0 pg   MCHC 34.8 31.5 - 35.7 g/dL   RDW 91.413.4 78.212.3 - 95.615.4 %   Platelets 286 150 - 379 x10E3/uL   Neutrophils 56 %   Lymphs 28 %   Monocytes 7 %   Eos 7 %   Basos 1 %   Neutrophils Absolute 3.7 1.4 - 7.0 x10E3/uL   Lymphocytes Absolute 1.8 0.7 - 3.1 x10E3/uL   Monocytes Absolute 0.5 0.1 - 0.9 x10E3/uL   EOS (ABSOLUTE) 0.4 0.0 - 0.4 x10E3/uL   Basophils Absolute 0.0 0.0 - 0.2 x10E3/uL   Immature Granulocytes 1 %  Immature Grans (Abs) 0.0 0.0 - 0.1 x10E3/uL  Lipid Panel w/o Chol/HDL Ratio  Result Value Ref Range   Cholesterol, Total 190 100 - 199 mg/dL   Triglycerides 161 0 - 149 mg/dL   HDL 54 >09 mg/dL   VLDL Cholesterol Cal 20 5 - 40 mg/dL   LDL Calculated 604 (H) 0 - 99 mg/dL  Comprehensive metabolic panel  Result Value Ref Range   Glucose 90 65 - 99 mg/dL   BUN 11 6 - 20 mg/dL   Creatinine, Ser 5.40 0.57 - 1.00 mg/dL   GFR calc non Af Amer 107 >59 mL/min/1.73   GFR calc Af Amer 124 >59 mL/min/1.73   BUN/Creatinine Ratio 15 8 - 20   Sodium 138 136 - 144 mmol/L   Potassium 4.8 3.5 - 5.2 mmol/L   Chloride 99 97 - 106 mmol/L   CO2 25 18 - 29 mmol/L   Calcium 9.1 8.7 - 10.2 mg/dL   Total  Protein 6.3 6.0 - 8.5 g/dL   Albumin 4.1 3.5 - 5.5 g/dL   Globulin, Total 2.2 1.5 - 4.5 g/dL   Albumin/Globulin Ratio 1.9 1.1 - 2.5   Bilirubin Total 0.3 0.0 - 1.2 mg/dL   Alkaline Phosphatase 75 39 - 117 IU/L   AST 16 0 - 40 IU/L   ALT 20 0 - 32 IU/L  TSH  Result Value Ref Range   TSH 1.630 0.450 - 4.500 uIU/mL      Assessment & Plan:   Problem List Items Addressed This Visit    None    Visit Diagnoses    Solar dermatitis    -  Primary    Discussed care and treatment use of triamcinolone    Acute sinusitis, recurrence not specified, unspecified location        Discussed sinusitis care and treatment use of over-the-counter medications use of antibiotics nasal rinse    Relevant Medications    amoxicillin-clavulanate (AUGMENTIN) 875-125 MG tablet        Follow up plan: Return for As scheduled.

## 2015-12-02 ENCOUNTER — Ambulatory Visit (INDEPENDENT_AMBULATORY_CARE_PROVIDER_SITE_OTHER): Payer: Medicaid Other | Admitting: Podiatry

## 2015-12-02 ENCOUNTER — Encounter: Payer: Self-pay | Admitting: Podiatry

## 2015-12-02 DIAGNOSIS — M722 Plantar fascial fibromatosis: Secondary | ICD-10-CM

## 2015-12-02 NOTE — Progress Notes (Signed)
Patient ID: Holly French, female   DOB: 1976-04-09, 40 y.o.   MRN: [id number]  Subjective: Patient presents today for path evaluation of right heel pain. She states that she continues have pain to that her right heel is about unchanged. Denies any recent injury or trauma. She does continue to work 2 jobs on her feet all the time. She denies any numbness or tingling to her feet today. She gets some pain to the inside aspect of her foot/ankle as well. She hasn't he was stretching, icing as much as she can. She has not tried orthotics yet. Denies any systemic complaints such as fevers, chills, nausea, vomiting. No acute changes since last appointment, and no other complaints at this time.   Objective: AAO x3, NAD DP/PT pulses palpable bilaterally, CRT less than 3 seconds Negative tinel sign There is continuation of tenderness to palpation along the plantar medial tubercle of the calcaneus at the insertion of plantar fascia on the right foot is unchanged. There is no pain along the course of the plantar fascia within the arch of the foot. Plantar fascia appears to be intact. There is no pain with lateral compression of the calcaneus or pain with vibratory sensation. There is no pain along the course or insertion of the achilles tendon. There is tenderness along the course of posterior tibial tendon inferior to medial malleolus and on the insertion of the navicular tuberosity. No other areas of tenderness to bilateral lower extremities. No edema, erythema, increase in warmth to bilateral lower extremities.  No open lesions or pre-ulcerative lesions.  No pain with calf compression, swelling, warmth, erythema  Assessment: 40 year old female chronic right heel pain, plantar fasciitis  Plan: -All treatment options discussed with the patient including all alternatives, risks, complications.  -This and she has tried multiple conservative treatments without any relief. I gave her a set of power steps today  as she has not been able to try good inserts for her shoes. Her physical therapy however she wishes to hold off. She's had injections in her back and this is unchanged from her feet. They have not been helping in general. Discussed surgery but negative MRI and would like to hold off. Will send MRI for a second opinion. -Follow-up 4-6 weeks or sooner if any problems arise. In the meantime, encouraged to call the office with any questions, concerns, change in symptoms.   Ovid CurdMatthew Wagoner, DPM

## 2015-12-18 ENCOUNTER — Telehealth: Payer: Self-pay | Admitting: *Deleted

## 2015-12-18 NOTE — Telephone Encounter (Signed)
MRI disk was sent to [facility] for a re-read. 

## 2015-12-30 ENCOUNTER — Encounter: Payer: Self-pay | Admitting: Podiatry

## 2015-12-30 ENCOUNTER — Ambulatory Visit (INDEPENDENT_AMBULATORY_CARE_PROVIDER_SITE_OTHER): Payer: Medicaid Other | Admitting: Podiatry

## 2015-12-30 DIAGNOSIS — M722 Plantar fascial fibromatosis: Secondary | ICD-10-CM

## 2015-12-30 DIAGNOSIS — M216X1 Other acquired deformities of right foot: Secondary | ICD-10-CM | POA: Diagnosis not present

## 2015-12-30 NOTE — Patient Instructions (Signed)

## 2016-01-01 NOTE — Progress Notes (Signed)
Patient ID: Holly French, female   DOB: 11-Feb-1976, 40 y.o.   MRN: [id number]   Subjective: Patient presents today for path evaluation of right heel pain. She states that she is still having quite a bit of discomfort on her right heel. She states that she has been working quite a bit as well. She denies any numbness or tingling at this time. She is also been following up for her back. At this point she has tried shoe changes, orthotics, stretching, icing, steroid injections, EPAT without any relief. Pain does not wake her up at night. Denies any claudication symptoms. She has no new concerns today.  Objective: AAO x3, NAD DP/PT pulses palpable bilaterally, CRT less than 3 seconds Negative tinel sign There is continuation of tenderness to palpation along the plantar medial tubercle of the calcaneus at the insertion of plantar fascia on the right foot is unchanged. There is no pain along the course of the plantar fascia within the arch of the foot. Plantar fascia appears to be intact. There is no pain with lateral compression of the calcaneus or pain with vibratory sensation. There is no pain along the course or insertion of the achilles tendon. There is mild tenderness along the course of posterior tibial tendon inferior to medial malleolus and on the insertion of the navicular tuberosity. Able to do heel rise. No other areas of tenderness to bilateral lower extremities. Equinus is present. No edema, erythema, increase in warmth to bilateral lower extremities.  No open lesions or pre-ulcerative lesions.  No pain with calf compression, swelling, warmth, erythema  Assessment: 40 year old female chronic right heel pain, plantar fasciitis  Plan: -All treatment options discussed with the patient including all alternatives, risks, complications.  -I reviewed with her the MRI over read. -At this time she is on multiple conservative treatments for any relief of symptoms. I discussed with her surgical  intervention. Discussed that this is no guarantee of resolution symptoms and could make the symptoms worsen she understands this but this point she is having no relief and wishes to proceed with surgery. -I discussed her gastrocnemius recession plantar fasciitis endoscopic release. She wishes to proceed. I discussed the postoperative recovery course and the amount of time that she need to be off of work. She states she would medial to do this. -The incision placement as well as the postoperative course was discussed with the patient. I discussed risks of the surgery which include, but not limited to, infection, bleeding, pain, swelling, need for further surgery, delayed or nonhealing, painful or ugly scar, numbness or sensation changes, over/under correction, recurrence, transfer lesions, further deformity, hardware failure, DVT/PE, loss of toe/foot. Patient understands these risks and wishes to proceed with surgery. The surgical consent was reviewed with the patient all 3 pages were signed. No promises or guarantees were given to the outcome of the procedure. All questions were answered to the best of my ability. Before the surgery the patient was encouraged to call the office if there is any further questions. [facility].  Ovid CurdMatthew Akari Defelice, DPM

## 2016-01-02 ENCOUNTER — Encounter: Payer: Self-pay | Admitting: Podiatry

## 2016-01-12 DIAGNOSIS — M722 Plantar fascial fibromatosis: Secondary | ICD-10-CM | POA: Diagnosis not present

## 2016-01-12 DIAGNOSIS — M216X9 Other acquired deformities of unspecified foot: Secondary | ICD-10-CM | POA: Diagnosis not present

## 2016-01-14 HISTORY — PX: OTHER SURGICAL HISTORY: SHX169

## 2016-01-20 ENCOUNTER — Ambulatory Visit (INDEPENDENT_AMBULATORY_CARE_PROVIDER_SITE_OTHER): Payer: Medicaid Other | Admitting: Podiatry

## 2016-01-20 ENCOUNTER — Encounter: Payer: Self-pay | Admitting: Podiatry

## 2016-01-20 DIAGNOSIS — Z9889 Other specified postprocedural states: Secondary | ICD-10-CM

## 2016-01-20 DIAGNOSIS — M216X1 Other acquired deformities of right foot: Secondary | ICD-10-CM

## 2016-01-20 DIAGNOSIS — M722 Plantar fascial fibromatosis: Secondary | ICD-10-CM

## 2016-01-20 MED ORDER — OXYCODONE-ACETAMINOPHEN 5-325 MG PO TABS: 1.0000 | ORAL_TABLET | Freq: Three times a day (TID) | ORAL | 0 refills | Status: DC | PRN

## 2016-01-20 NOTE — Addendum Note (Signed)
Addended by: Ovid CurdWAGONER, MATTHEW R on: 01/20/2016 08:55 AM   Modules accepted: Orders

## 2016-01-20 NOTE — Progress Notes (Signed)
Subjective: Holly French is a 40 y.o. is seen today in office s/p right EPF and gastroc recession preformed on 01/14/16. Holly French states that she is having pain and is still taking pain medicine. She has fallen twice put all of her weight on her foot. She's been taking aspirin daily as well. Denies any systemic complaints such as fevers, chills, nausea, vomiting. No calf pain, chest pain, shortness of breath.   Objective: General: No acute distress, AAOx3  DP/PT pulses palpable 2/4, CRT < 3 sec to all digits.  Protective sensation intact. Motor function intact.  Right foot/leg: Incision is well coapted without any evidence of dehiscence and sutures intact. There is no surrounding erythema, ascending cellulitis, fluctuance, crepitus, malodor, drainage/purulence. There is minimal edema around the surgical site. There is continued pain along the surgical site.  No other areas of tenderness to bilateral lower extremities.  No other open lesions or pre-ulcerative lesions.  No pain with calf compression, swelling, warmth, erythema.   Assessment and Plan:  Status post right foot EPF and gastroc recession  -Treatment options discussed including all alternatives, risks, and complications -Antibiotic ointment and a bandage was applied followed by dressing. Keep dressing clean, dry, intact. -Continue wear cam boot at all times. Wear cam boot at night as well. -Ice/elevation -Pain medication as needed. Refilled today. -She can start to transition to partial weightbearing as tolerated. -Monitor for any clinical signs or symptoms of infection and DVT/PE and directed to call the office immediately should any occur or go to the ER. -Follow-up in 1 week for suture removal or sooner if any problems arise. In the meantime, encouraged to call the office with any questions, concerns, change in symptoms.   Ovid CurdMatthew Wagoner, DPM

## 2016-01-27 ENCOUNTER — Encounter: Payer: Self-pay | Admitting: Podiatry

## 2016-01-27 ENCOUNTER — Ambulatory Visit (INDEPENDENT_AMBULATORY_CARE_PROVIDER_SITE_OTHER): Payer: Medicaid Other | Admitting: Podiatry

## 2016-01-27 DIAGNOSIS — Z9889 Other specified postprocedural states: Secondary | ICD-10-CM

## 2016-01-27 DIAGNOSIS — M216X1 Other acquired deformities of right foot: Secondary | ICD-10-CM

## 2016-01-27 DIAGNOSIS — M722 Plantar fascial fibromatosis: Secondary | ICD-10-CM

## 2016-02-01 NOTE — Progress Notes (Signed)
Subjective: Holly French is a 40 y.o. is seen today in office s/p right EPF and gastroc recession preformed on 01/14/16. She says her pain is improved. She has swelling again since last appointment. She said that she was outside smoking and she turned around because she hurts some in the back yard and tripped and fell. She is remaining in a cam boot however she has not been sleeping in a cam boot. She does continue use crutches. She has but somewhat on her foot. Denies any systemic complaints such as fevers, chills, nausea, vomiting. No calf pain, chest pain, shortness of breath.   Objective: General: No acute distress, AAOx3  DP/PT pulses palpable 2/4, CRT < 3 sec to all digits.  Protective sensation intact. Motor function intact.  Right foot/leg: Incision is well coapted without any evidence of dehiscence and sutures intact. There is no surrounding erythema, ascending cellulitis, fluctuance, crepitus, malodor, drainage/purulence. There is minimal edema around the surgical site. There is continued pain along the surgical site.  No other areas of tenderness to bilateral lower extremities.  No other open lesions or pre-ulcerative lesions.  No pain with calf compression, swelling, warmth, erythema.   Assessment and Plan:  Status post right foot EPF and gastroc recession  -Treatment options discussed including all alternatives, risks, and complications -Sutures removed. Antibiotic ointment was applied followed by a dressing. She consented to shower and apply Neosporin and a bandage daily. Continue wear cam boot at all times, even at night. She has started to partial weight-bear as tolerated in cam boot. Must wear cam boot all times. Continue ice and elevation. Pain medication as needed. Continue to monitor for signs or symptoms of infection and her DVT/PE and directed to call the office and medially should any occur or go to the ER. Follow-up as scheduled or sooner if needed. Call any questions or  concerns.  Ovid CurdMatthew Wagoner, DPM

## 2016-02-03 ENCOUNTER — Telehealth: Payer: Self-pay | Admitting: *Deleted

## 2016-02-03 MED ORDER — CEPHALEXIN 500 MG PO CAPS: 500.0000 mg | ORAL_CAPSULE | Freq: Four times a day (QID) | ORAL | 0 refills | Status: DC

## 2016-02-03 NOTE — Telephone Encounter (Signed)
Patient called and stated that her right calf area was looking like it was draining a little bit of yellowish green color as well as the incision on the outside of the right heel and patient stated that she had a low grade fever and per Dr Ardelle AntonWagoner called Keflex 500 mg quantity 30 to the pharmacy 2:06 pm. Patient will be coming in on Thursday august 24th, 2017. Misty StanleyLisa

## 2016-02-03 NOTE — Addendum Note (Signed)
Addended by: Hadley PenOX, Ricci Dirocco R on: 02/03/2016 02:04 PM   Modules accepted: Orders

## 2016-02-05 ENCOUNTER — Ambulatory Visit (INDEPENDENT_AMBULATORY_CARE_PROVIDER_SITE_OTHER): Payer: Medicaid Other | Admitting: Podiatry

## 2016-02-05 ENCOUNTER — Encounter: Payer: Self-pay | Admitting: Podiatry

## 2016-02-05 DIAGNOSIS — M722 Plantar fascial fibromatosis: Secondary | ICD-10-CM

## 2016-02-05 DIAGNOSIS — M216X1 Other acquired deformities of right foot: Secondary | ICD-10-CM

## 2016-02-05 DIAGNOSIS — Z9889 Other specified postprocedural states: Secondary | ICD-10-CM

## 2016-02-05 MED ORDER — OXYCODONE-ACETAMINOPHEN 5-325 MG PO TABS: 1.0000 | ORAL_TABLET | Freq: Three times a day (TID) | ORAL | 0 refills | Status: DC | PRN

## 2016-02-05 NOTE — Progress Notes (Signed)
Subjective: Holly RousLaura M Couchman is a 40 y.o. is seen today in office s/p right EPF and gastroc recession preformed on 01/14/16. She presents today for concerns of possible infection. She said that she has had a sympathetic likely coming the incision but denies any redness or warmth around the incision. She states that she is starting to put some weight on her foot utilizing crutches in the cam boot. She states that she like to get to where she is walking in the cam boot shortly in order to start driving. She does that the pain has improved in the left low days.  Denies any systemic complaints such as fevers, chills, nausea, vomiting. No calf pain, chest pain, shortness of breath.   Objective: General: No acute distress, AAOx3  DP/PT pulses palpable 2/4, CRT < 3 sec to all digits.  Protective sensation intact. Motor function intact.  Right foot/leg: Incision is well coapted without any evidence of dehiscence and scars have formed. I'm unable to express any fluid or drainage coming from any of the incisions all other slightly macerated skin around the lateral plantar fashion incision. On the gastrocnemius incision is small tiny red bumps around the incision along with a corresponds to the bandage. There is no increase in warmth or ascending cellulitis. No fluctuance or crepitus. There is no malodor. I'm unable to express any pus today. There is tenderness overlying the surgical sites however subjective this has improved some.  No other open lesions or pre-ulcerative lesions.  No pain with calf compression, swelling, warmth, erythema.   Assessment and Plan:  Status post right foot EPF and gastroc recession  -Treatment options discussed including all alternatives, risks, and complications -At this time continue/finish course of antibiotics. She was often appointment on Tuesday, however she declined that Today. Appear That She May Have an Allergic Reaction to Neosporin Which Is Applied on the Incision. I  Prescribed Mupirocin Ointment Today for Her. Continue Daily Dressing Changes. Monitor for Signs or Symptoms of Infection. I Refilled Her Pain Medicine Today. Continue in Cam Boot She Did Transition to Partial Weightbearing As Tolerated. Ice and Elevation. Limit Weightbearing. She Reports That She's Had a Fall Last Appointment. She's Had Multiple Falls on Crutches to Get Her to Start Walking without Them As Soon As Possible. -Follow-up in 1 week or sooner if needed.   Ovid CurdMatthew Taevion Sikora, DPM

## 2016-02-06 ENCOUNTER — Telehealth: Payer: Self-pay | Admitting: *Deleted

## 2016-02-06 ENCOUNTER — Telehealth: Payer: Self-pay | Admitting: Podiatry

## 2016-02-06 MED ORDER — MUPIROCIN 2 % EX OINT: 1.0000 "application " | TOPICAL_OINTMENT | Freq: Two times a day (BID) | CUTANEOUS | 1 refills | Status: DC

## 2016-02-06 NOTE — Telephone Encounter (Signed)
Pt called stating dr Ardelle Antonwagoner was suppose to send over rx to pharmacy but they didn't get it

## 2016-02-06 NOTE — Telephone Encounter (Signed)
Pt states the rx of 02/05/2016 was not at the pharmacy. Left message informing pt the rx was a handcarry narcotic, and that [facility] prior to 2:00pm today the doctor would write. Pt returned call states the antibiotic ointment was not at the pharmacy.  I reviewed 02/05/2016 clinical note and Mupirocin 2% ointment was to be prescribed. [facility]

## 2016-02-10 ENCOUNTER — Encounter: Payer: Medicaid Other | Admitting: Podiatry

## 2016-02-12 ENCOUNTER — Encounter: Payer: Self-pay | Admitting: Podiatry

## 2016-02-12 ENCOUNTER — Ambulatory Visit (INDEPENDENT_AMBULATORY_CARE_PROVIDER_SITE_OTHER): Payer: Medicaid Other | Admitting: Podiatry

## 2016-02-12 DIAGNOSIS — Z9889 Other specified postprocedural states: Secondary | ICD-10-CM

## 2016-02-12 DIAGNOSIS — M216X1 Other acquired deformities of right foot: Secondary | ICD-10-CM

## 2016-02-12 DIAGNOSIS — M722 Plantar fascial fibromatosis: Secondary | ICD-10-CM

## 2016-02-12 MED ORDER — CYCLOBENZAPRINE HCL 10 MG PO TABS: 10.0000 mg | ORAL_TABLET | Freq: Three times a day (TID) | ORAL | 0 refills | Status: DC | PRN

## 2016-02-12 MED ORDER — OXYCODONE-ACETAMINOPHEN 5-325 MG PO TABS: 1.0000 | ORAL_TABLET | Freq: Three times a day (TID) | ORAL | 0 refills | Status: DC | PRN

## 2016-02-16 NOTE — Progress Notes (Signed)
Subjective: Holly French is a 40 y.o. is seen today in office s/p right EPF and gastroc recession preformed on 01/14/16. She presents today wearing a cam boot utilizing 1 crutch and partially weightbearing to her foot in the cam boot. She states her pain is improved she still grandson spasm the surgical site and the leg. Denies any systemic complaints such as fevers, chills, nausea, vomiting. No calf pain, chest pain, shortness of breath.   Objective: General: No acute distress, AAOx3  DP/PT pulses palpable 2/4, CRT < 3 sec to all digits.  Protective sensation intact. Motor function intact.  Right foot/leg: Incision is well coapted without any evidence of dehiscence and scars have formed. There is no drainage or pus expressed from the incision. There is no swelling erythema or ascending synovitis. No fluctuance or crepitus. There is no malodor. There is continued tenderness in the surgical sites however this is improving as well. Minimal edema to the surgical sites. No other open lesions or pre-ulcer lesions. No pain with calf compression, swelling, warmth, erythema.   Assessment and Plan:  Status post right foot EPF and gastroc recession  -Treatment options discussed including all alternatives, risks, and complications -At this time continue/finish course of antibiotics. She was often appointment on Tuesday, however she declined that Today. Appear That She May Have an Allergic Reaction to Neosporin Which Is Applied on the Incision. I Prescribed Mupirocin Ointment Today for Her. Continue Daily Dressing Changes. Monitor for Signs or Symptoms of Infection. I Refilled Her Pain Medicine Today. Continue in Cam Boot She Did Transition to Partial Weightbearing As Tolerated. Ice and Elevation. Limit Weightbearing. She Reports That She's Had a Fall Last Appointment. She's Had Multiple Falls on Crutches to Get Her to Start Walking without Them As Soon As Possible.  -Follow-up as scheduled or sooner if needed.    Holly French, DPM

## 2016-02-26 ENCOUNTER — Encounter: Payer: Self-pay | Admitting: Podiatry

## 2016-02-26 ENCOUNTER — Ambulatory Visit (INDEPENDENT_AMBULATORY_CARE_PROVIDER_SITE_OTHER): Payer: Self-pay | Admitting: Podiatry

## 2016-02-26 DIAGNOSIS — M216X1 Other acquired deformities of right foot: Secondary | ICD-10-CM

## 2016-02-26 DIAGNOSIS — R609 Edema, unspecified: Secondary | ICD-10-CM

## 2016-02-26 DIAGNOSIS — M722 Plantar fascial fibromatosis: Secondary | ICD-10-CM

## 2016-02-26 DIAGNOSIS — Z9889 Other specified postprocedural states: Secondary | ICD-10-CM

## 2016-02-26 MED ORDER — CYCLOBENZAPRINE HCL 10 MG PO TABS: 10.0000 mg | ORAL_TABLET | Freq: Three times a day (TID) | ORAL | 0 refills | Status: DC | PRN

## 2016-02-26 MED ORDER — OXYCODONE-ACETAMINOPHEN 5-325 MG PO TABS: 1.0000 | ORAL_TABLET | Freq: Three times a day (TID) | ORAL | 0 refills | Status: DC | PRN

## 2016-02-26 NOTE — Progress Notes (Signed)
Subjective: Holly French is a 40 y.o. is seen today in office s/p right EPF and gastroc recession preformed on 01/14/16. She states that the heel is doing better. She is getting come pain still along the calf incision. She has some numbness to the top of her but not on the bottom or calf. Due to insurance issues she's had multiple issues gained medication and she has not had any medication since Monday including pain medication. She's not any back medication either. She is scheduled for another back injection next week. She does continue with a cam boot. She has been doing some home stretching and rehabilitation exercises she went to physical therapy but she was told she could not go due to insurance issues. Denies any systemic complaints such as fevers, chills, nausea, vomiting. No calf pain, chest pain, shortness of breath.   Objective: General: No acute distress, AAOx3  DP/PT pulses palpable 2/4, CRT < 3 sec to all digits.  Protective sensation intact. Motor function intact.  Right foot/leg: Incision is well coapted without any evidence of dehiscence and scars have formed. There is decreased tenderness palpation on the plantar aspect of the heel and the surgical site. The some mild tailors on the incision to the calf. There is no pain with calf compression. There is some slight edema that he erythema or increase in warmth. There is no areas of fluctuance or crepitus. There is no malodor. No other areas of tenderness bilaterally. Open lesions or process for lesions. No pain with calf compression, warmth, erythema.   Assessment and Plan:  Status post right foot EPF and gastroc recession  -Treatment options discussed including all alternatives, risks, and complications -In the boot was applied. Post procedure tractions were discussed. -Start home rehabilitation exercises. We will try [facility]. -Continue pain medication as needed. I refilled Flexeril as  well as Percocet. She states that she has discussed this with her back doctor as well as pain management doctor she's been taking these medications. -Continue cam boot. -Ice and elevation. -Follow-up as scheduled or sooner if needed.   Ovid CurdMatthew Dolly Harbach, DPM

## 2016-02-26 NOTE — Patient Instructions (Signed)
Plantar Fasciitis With Rehab The plantar fascia is a fibrous, ligament-like, soft-tissue structure that spans the bottom of the foot. Plantar fasciitis, also called heel spur syndrome, is a condition that causes pain in the foot due to inflammation of the tissue. SYMPTOMS   Pain and tenderness on the underneath side of the foot.  Pain that worsens with standing or walking. CAUSES  Plantar fasciitis is caused by irritation and injury to the plantar fascia on the underneath side of the foot. Common mechanisms of injury include:  Direct trauma to bottom of the foot.  Damage to a small nerve that runs under the foot where the main fascia attaches to the heel bone.  Stress placed on the plantar fascia due to bone spurs. RISK INCREASES WITH:   Activities that place stress on the plantar fascia (running, jumping, pivoting, or cutting).  Poor strength and flexibility.  Improperly fitted shoes.  Tight calf muscles.  Flat feet.  Failure to warm-up properly before activity.  Obesity. PREVENTION  Warm up and stretch properly before activity.  Allow for adequate recovery between workouts.  Maintain physical fitness:  Strength, flexibility, and endurance.  Cardiovascular fitness.  Maintain a health body weight.  Avoid stress on the plantar fascia.  Wear properly fitted shoes, including arch supports for individuals who have flat feet. PROGNOSIS  If treated properly, then the symptoms of plantar fasciitis usually resolve without surgery. However, occasionally surgery is necessary. RELATED COMPLICATIONS   Recurrent symptoms that may result in a chronic condition.  Problems of the lower back that are caused by compensating for the injury, such as limping.  Pain or weakness of the foot during push-off following surgery.  Chronic inflammation, scarring, and partial or complete fascia tear, occurring more often from repeated injections. TREATMENT  Treatment initially involves  the use of ice and medication to help reduce pain and inflammation. The use of strengthening and stretching exercises may help reduce pain with activity, especially stretches of the Achilles tendon. These exercises may be performed at home or with a therapist. Your caregiver may recommend that you use heel cups of arch supports to help reduce stress on the plantar fascia. Occasionally, corticosteroid injections are given to reduce inflammation. If symptoms persist for greater than 6 months despite non-surgical (conservative), then surgery may be recommended.  MEDICATION   If pain medication is necessary, then nonsteroidal anti-inflammatory medications, such as aspirin and ibuprofen, or other minor pain relievers, such as acetaminophen, are often recommended.  Do not take pain medication within 7 days before surgery.  Prescription pain relievers may be given if deemed necessary by your caregiver. Use only as directed and only as much as you need.  Corticosteroid injections may be given by your caregiver. These injections should be reserved for the most serious cases, because they may only be given a certain number of times. HEAT AND COLD  Cold treatment (icing) relieves pain and reduces inflammation. Cold treatment should be applied for 10 to 15 minutes every 2 to 3 hours for inflammation and pain and immediately after any activity that aggravates your symptoms. Use ice packs or massage the area with a piece of ice (ice massage).  Heat treatment may be used prior to performing the stretching and strengthening activities prescribed by your caregiver, physical therapist, or athletic trainer. Use a heat pack or soak the injury in warm water. SEEK IMMEDIATE MEDICAL CARE IF:  Treatment seems to offer no benefit, or the condition worsens.  Any medications produce adverse side effects.   EXERCISES RANGE OF MOTION (ROM) AND STRETCHING EXERCISES - Plantar Fasciitis (Heel Spur Syndrome) These exercises may  help you when beginning to rehabilitate your injury. Your symptoms may resolve with or without further involvement from your physician, physical therapist or athletic trainer. While completing these exercises, remember:   Restoring tissue flexibility helps normal motion to return to the joints. This allows healthier, less painful movement and activity.  An effective stretch should be held for at least 30 seconds.  A stretch should never be painful. You should only feel a gentle lengthening or release in the stretched tissue. RANGE OF MOTION - Toe Extension, Flexion  Sit with your right / left leg crossed over your opposite knee.  Grasp your toes and gently pull them back toward the top of your foot. You should feel a stretch on the bottom of your toes and/or foot.  Hold this stretch for __________ seconds.  Now, gently pull your toes toward the bottom of your foot. You should feel a stretch on the top of your toes and or foot.  Hold this stretch for __________ seconds. Repeat __________ times. Complete this stretch __________ times per day.  RANGE OF MOTION - Ankle Dorsiflexion, Active Assisted  Remove shoes and sit on a chair that is preferably not on a carpeted surface.  Place right / left foot under knee. Extend your opposite leg for support.  Keeping your heel down, slide your right / left foot back toward the chair until you feel a stretch at your ankle or calf. If you do not feel a stretch, slide your bottom forward to the edge of the chair, while still keeping your heel down.  Hold this stretch for __________ seconds. Repeat __________ times. Complete this stretch __________ times per day.  STRETCH - Gastroc, Standing  Place hands on wall.  Extend right / left leg, keeping the front knee somewhat bent.  Slightly point your toes inward on your back foot.  Keeping your right / left heel on the floor and your knee straight, shift your weight toward the wall, not allowing your  back to arch.  You should feel a gentle stretch in the right / left calf. Hold this position for __________ seconds. Repeat __________ times. Complete this stretch __________ times per day. STRETCH - Soleus, Standing  Place hands on wall.  Extend right / left leg, keeping the other knee somewhat bent.  Slightly point your toes inward on your back foot.  Keep your right / left heel on the floor, bend your back knee, and slightly shift your weight over the back leg so that you feel a gentle stretch deep in your back calf.  Hold this position for __________ seconds. Repeat __________ times. Complete this stretch __________ times per day. STRETCH - Gastrocsoleus, Standing  Note: This exercise can place a lot of stress on your foot and ankle. Please complete this exercise only if specifically instructed by your caregiver.   Place the ball of your right / left foot on a step, keeping your other foot firmly on the same step.  Hold on to the wall or a rail for balance.  Slowly lift your other foot, allowing your body weight to press your heel down over the edge of the step.  You should feel a stretch in your right / left calf.  Hold this position for __________ seconds.  Repeat this exercise with a slight bend in your right / left knee. Repeat __________ times. Complete this stretch __________ times   per day.  STRENGTHENING EXERCISES - Plantar Fasciitis (Heel Spur Syndrome)  These exercises may help you when beginning to rehabilitate your injury. They may resolve your symptoms with or without further involvement from your physician, physical therapist or athletic trainer. While completing these exercises, remember:   Muscles can gain both the endurance and the strength needed for everyday activities through controlled exercises.  Complete these exercises as instructed by your physician, physical therapist or athletic trainer. Progress the resistance and repetitions only as  guided. STRENGTH - Towel Curls  Sit in a chair positioned on a non-carpeted surface.  Place your foot on a towel, keeping your heel on the floor.  Pull the towel toward your heel by only curling your toes. Keep your heel on the floor.  If instructed by your physician, physical therapist or athletic trainer, add ____________________ at the end of the towel. Repeat __________ times. Complete this exercise __________ times per day. STRENGTH - Ankle Inversion  Secure one end of a rubber exercise band/tubing to a fixed object (table, pole). Loop the other end around your foot just before your toes.  Place your fists between your knees. This will focus your strengthening at your ankle.  Slowly, pull your big toe up and in, making sure the band/tubing is positioned to resist the entire motion.  Hold this position for __________ seconds.  Have your muscles resist the band/tubing as it slowly pulls your foot back to the starting position. Repeat __________ times. Complete this exercises __________ times per day.    This information is not intended to replace advice given to you by your health care provider. Make sure you discuss any questions you have with your health care provider.   Document Released: 05/31/2005 Document Revised: 10/15/2014 Document Reviewed: 09/12/2008 Else[employer] Interactive Patient Education 2016 [employer] Achilles Tendinitis With Rehab Achilles tendinitis is a disorder of the Achilles tendon. The Achilles tendon connects the large calf muscles (Gastrocnemius and Soleus) to the heel bone (calcaneus). This tendon is sometimes called the heel cord. It is important for pushing-off and standing on your toes and is important for walking, running, or jumping. Tendinitis is often caused by overuse and repetitive microtrauma. SYMPTOMS  Pain, tenderness, swelling, warmth, and redness may occur over the Achilles tendon even at rest.  Pain with pushing off, or flexing or  extending the ankle.  Pain that is worsened after or during activity. CAUSES   Overuse sometimes seen with rapid increase in exercise programs or in sports requiring running and jumping.  Poor physical conditioning (strength and flexibility or endurance).  Running sports, especially training running down hills.  Inadequate warm-up before practice or play or failure to stretch before participation.  Injury to the tendon. PREVENTION   Warm up and stretch before practice or competition.  Allow time for adequate rest and recovery between practices and competition.  Keep up conditioning.  Keep up ankle and leg flexibility.  Improve or keep muscle strength and endurance.  Improve cardiovascular fitness.  Use proper technique.  Use proper equipment (shoes, skates).  To help prevent recurrence, taping, protective strapping, or an adhesive bandage may be recommended for several weeks after healing is complete. PROGNOSIS   Recovery may take weeks to several months to heal.  Longer recovery is expected if symptoms have been prolonged.  Recovery is usually quicker if the inflammation is due to a direct blow as compared with overuse or sudden strain. RELATED COMPLICATIONS   Healing time will be prolonged if the condition   is not correctly treated. The injury must be given plenty of time to heal.  Symptoms can reoccur if activity is resumed too soon.  Untreated, tendinitis may increase the risk of tendon rupture requiring additional time for recovery and possibly surgery. TREATMENT   The first treatment consists of rest anti-inflammatory medication, and ice to relieve the pain.  Stretching and strengthening exercises after resolution of pain will likely help reduce the risk of recurrence. Referral to a physical therapist or athletic trainer for further evaluation and treatment may be helpful.  A walking boot or cast may be recommended to rest the Achilles tendon. This can help  break the cycle of inflammation and microtrauma.  Arch supports (orthotics) may be prescribed or recommended by your caregiver as an adjunct to therapy and rest.  Surgery to remove the inflamed tendon lining or degenerated tendon tissue is rarely necessary and has shown less than predictable results. MEDICATION   Nonsteroidal anti-inflammatory medications, such as aspirin and ibuprofen, may be used for pain and inflammation relief. Do not take within 7 days before surgery. Take these as directed by your caregiver. Contact your caregiver immediately if any bleeding, stomach upset, or signs of allergic reaction occur. Other minor pain relievers, such as acetaminophen, may also be used.  Pain relievers may be prescribed as necessary by your caregiver. Do not take prescription pain medication for longer than 4 to 7 days. Use only as directed and only as much as you need.  Cortisone injections are rarely indicated. Cortisone injections may weaken tendons and predispose to rupture. It is better to give the condition more time to heal than to use them. HEAT AND COLD  Cold is used to relieve pain and reduce inflammation for acute and chronic Achilles tendinitis. Cold should be applied for 10 to 15 minutes every 2 to 3 hours for inflammation and pain and immediately after any activity that aggravates your symptoms. Use ice packs or an ice massage.  Heat may be used before performing stretching and strengthening activities prescribed by your caregiver. Use a heat pack or a warm soak. SEEK MEDICAL CARE IF:  Symptoms get worse or do not improve in 2 weeks despite treatment.  New, unexplained symptoms develop. Drugs used in treatment may produce side effects. EXERCISES RANGE OF MOTION (ROM) AND STRETCHING EXERCISES - Achilles Tendinitis  These exercises may help you when beginning to rehabilitate your injury. Your symptoms may resolve with or without further involvement from your physician, physical  therapist or athletic trainer. While completing these exercises, remember:   Restoring tissue flexibility helps normal motion to return to the joints. This allows healthier, less painful movement and activity.  An effective stretch should be held for at least 30 seconds.  A stretch should never be painful. You should only feel a gentle lengthening or release in the stretched tissue. STRETCH - Gastroc, Standing   Place hands on wall.  Extend right / left leg, keeping the front knee somewhat bent.  Slightly point your toes inward on your back foot.  Keeping your right / left heel on the floor and your knee straight, shift your weight toward the wall, not allowing your back to arch.  You should feel a gentle stretch in the right / left calf. Hold this position for __________ seconds. Repeat __________ times. Complete this stretch __________ times per day. STRETCH - Soleus, Standing   Place hands on wall.  Extend right / left leg, keeping the other knee somewhat bent.  Slightly point   your toes inward on your back foot.  Keep your right / left heel on the floor, bend your back knee, and slightly shift your weight over the back leg so that you feel a gentle stretch deep in your back calf.  Hold this position for __________ seconds. Repeat __________ times. Complete this stretch __________ times per day. STRETCH - Gastrocsoleus, Standing  Note: This exercise can place a lot of stress on your foot and ankle. Please complete this exercise only if specifically instructed by your caregiver.   Place the ball of your right / left foot on a step, keeping your other foot firmly on the same step.  Hold on to the wall or a rail for balance.  Slowly lift your other foot, allowing your body weight to press your heel down over the edge of the step.  You should feel a stretch in your right / left calf.  Hold this position for __________ seconds.  Repeat this exercise with a slight bend in your  knee. Repeat __________ times. Complete this stretch __________ times per day.  STRENGTHENING EXERCISES - Achilles Tendinitis These exercises may help you when beginning to rehabilitate your injury. They may resolve your symptoms with or without further involvement from your physician, physical therapist or athletic trainer. While completing these exercises, remember:   Muscles can gain both the endurance and the strength needed for everyday activities through controlled exercises.  Complete these exercises as instructed by your physician, physical therapist or athletic trainer. Progress the resistance and repetitions only as guided.  You may experience muscle soreness or fatigue, but the pain or discomfort you are trying to eliminate should never worsen during these exercises. If this pain does worsen, stop and make certain you are following the directions exactly. If the pain is still present after adjustments, discontinue the exercise until you can discuss the trouble with your clinician. STRENGTH - Plantar-flexors   Sit with your right / left leg extended. Holding onto both ends of a rubber exercise band/tubing, loop it around the ball of your foot. Keep a slight tension in the band.  Slowly push your toes away from you, pointing them downward.  Hold this position for __________ seconds. Return slowly, controlling the tension in the band/tubing. Repeat __________ times. Complete this exercise __________ times per day.  STRENGTH - Plantar-flexors   Stand with your feet shoulder width apart. Steady yourself with a wall or table using as little support as needed.  Keeping your weight evenly spread over the width of your feet, rise up on your toes.*  Hold this position for __________ seconds. Repeat __________ times. Complete this exercise __________ times per day.  *If this is too easy, shift your weight toward your right / left leg until you feel challenged. Ultimately, you may be asked  to do this exercise with your right / left foot only. STRENGTH - Plantar-flexors, Eccentric  Note: This exercise can place a lot of stress on your foot and ankle. Please complete this exercise only if specifically instructed by your caregiver.   Place the balls of your feet on a step. With your hands, use only enough support from a wall or rail to keep your balance.  Keep your knees straight and rise up on your toes.  Slowly shift your weight entirely to your right / left toes and pick up your opposite foot. Gently and with controlled movement, lower your weight through your right / left foot so that your heel drops below the   level of the step. You will feel a slight stretch in the back of your calf at the end position.  Use the healthy leg to help rise up onto the balls of both feet, then lower weight only on the right / left leg again. Build up to 15 repetitions. Then progress to 3 consecutive sets of 15 repetitions.*  After completing the above exercise, complete the same exercise with a slight knee bend (about 30 degrees). Again, build up to 15 repetitions. Then progress to 3 consecutive sets of 15 repetitions.* Perform this exercise __________ times per day.  *When you easily complete 3 sets of 15, your physician, physical therapist or athletic trainer may advise you to add resistance by wearing a backpack filled with additional weight. STRENGTH - Plantar Flexors, Seated   Sit on a chair that allows your feet to rest flat on the ground. If necessary, sit at the edge of the chair.  Keeping your toes firmly on the ground, lift your right / left heel as far as you can without increasing any discomfort in your ankle. Repeat __________ times. Complete this exercise __________ times a day. *If instructed by your physician, physical therapist or athletic trainer, you may add ____________________ of resistance by placing a weighted object on your right / left knee.   This information is not  intended to replace advice given to you by your health care provider. Make sure you discuss any questions you have with your health care provider.   Document Released: 12/30/2004 Document Revised: 06/21/2014 Document Reviewed: 09/12/2008 Elsevier Interactive Patient Education 2016 [employer]  

## 2016-02-27 ENCOUNTER — Other Ambulatory Visit: Payer: Self-pay

## 2016-02-27 DIAGNOSIS — M722 Plantar fascial fibromatosis: Secondary | ICD-10-CM

## 2016-02-27 DIAGNOSIS — R609 Edema, unspecified: Secondary | ICD-10-CM

## 2016-02-27 DIAGNOSIS — Z9889 Other specified postprocedural states: Secondary | ICD-10-CM

## 2016-02-29 IMAGING — MR MR FOOT*R* W/O CM
5 series · 40 of 40 positions shown · non-contrast
Comparison: None.

CLINICAL DATA: Right heel pain. Question plantar fasciitis. Initial
encounter.

EXAM:
MRI OF THE RIGHT FOREFOOT WITHOUT CONTRAST
TECHNIQUE: Multiplanar, multisequence MR imaging of the ankle was performed. No
intravenous contrast was administered.

[Series 3: PD fat-sat · axial · 3.0mm · 0.53mm/px · z∈[-91,+38]mm · 9 of 40 slices shown]
[im 1/40]
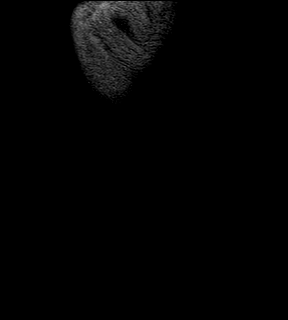
[im 5/40]
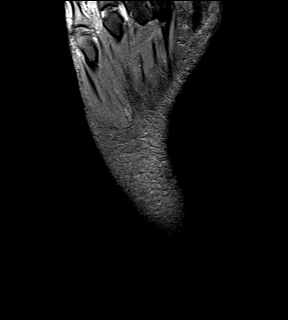
[im 10/40]
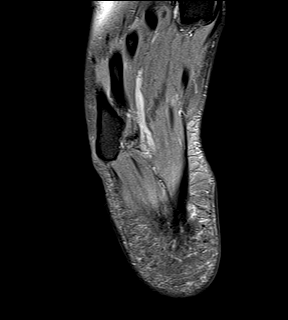
[im 15/40]
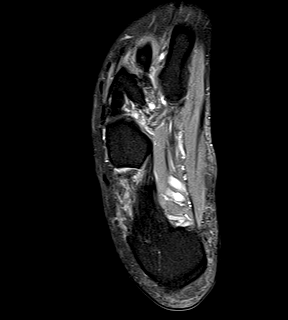
[im 20/40]
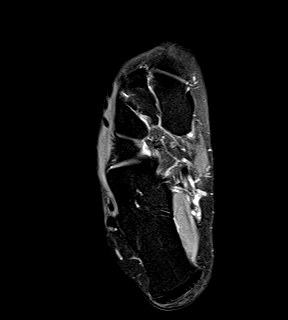
[im 25/40]
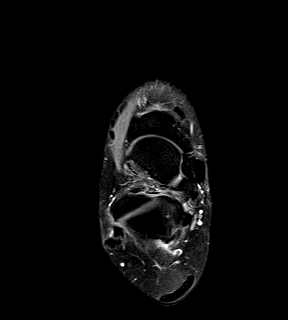
[im 30/40]
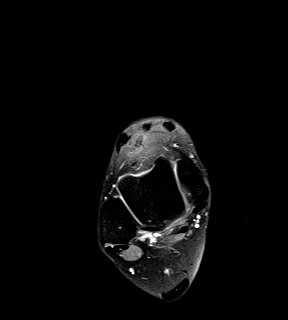
[im 35/40]
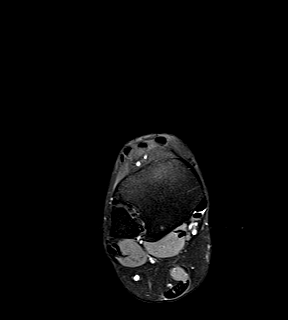
[im 40/40]
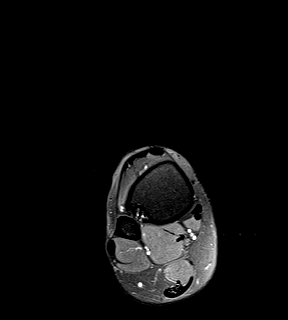

[Series 4: T2 fat-sat · axial · 3.0mm · 0.53mm/px · z∈[-91,+38]mm · 9 of 40 slices shown (1 of 3)]
[im 1/40]
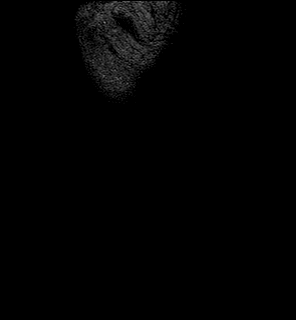
[im 5/40]
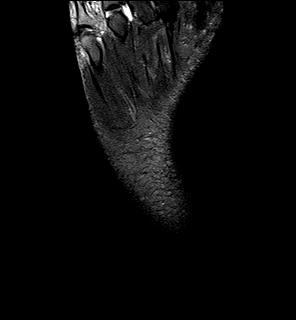
[im 10/40]
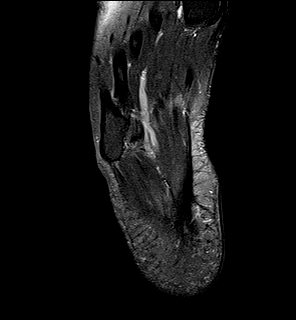
[im 15/40]
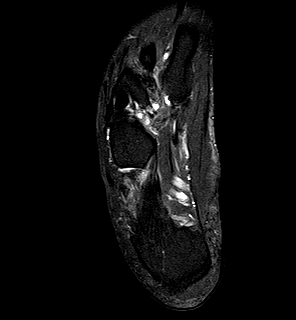
[im 20/40]
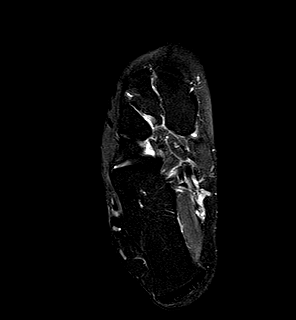
[im 25/40]
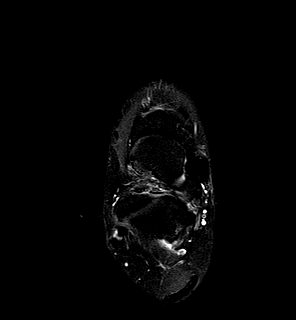
[im 30/40]
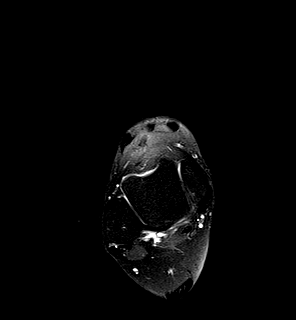
[im 35/40]
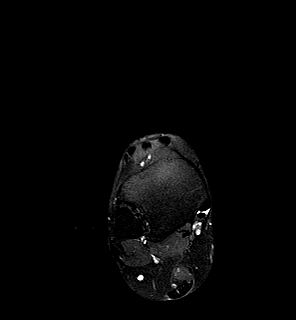
[im 40/40]
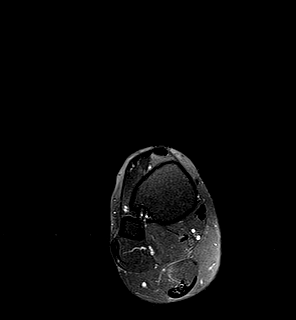

[Series 5: T2 fat-sat · coronal · 3.0mm · 0.56mm/px · 10 of 47 slices shown (2 of 3)]
[im 1/47]
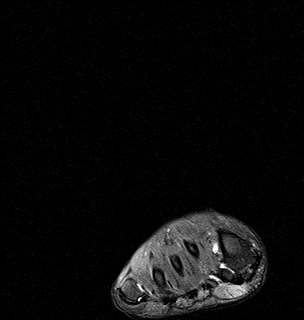
[im 6/47]
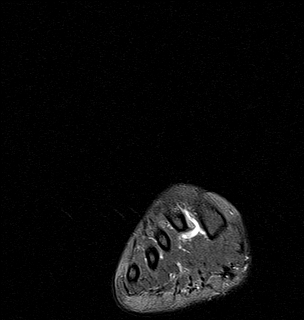
[im 11/47]
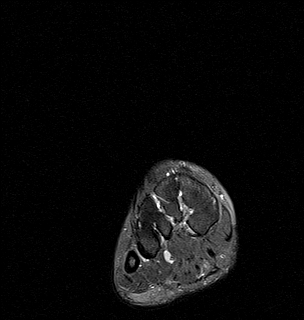
[im 16/47]
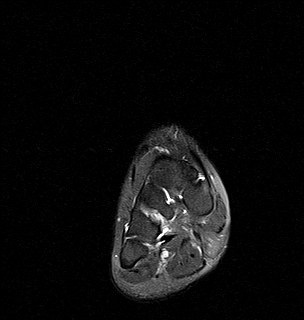
[im 21/47]
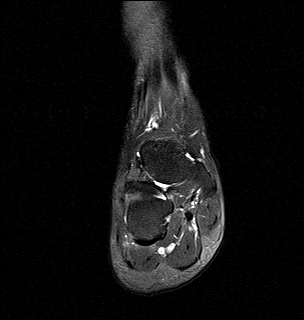
[im 26/47]
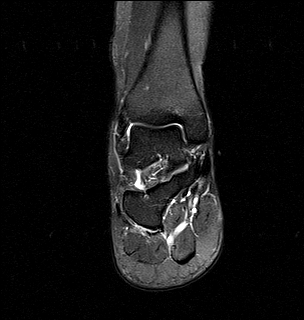
[im 31/47]
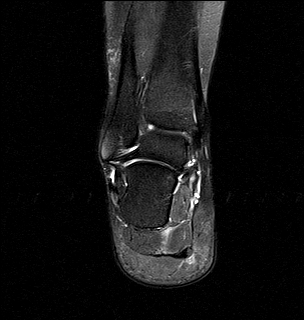
[im 36/47]
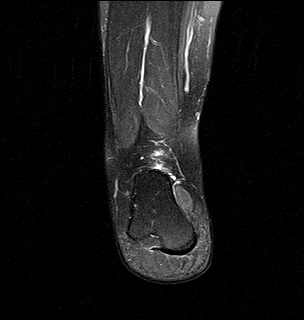
[im 41/47]
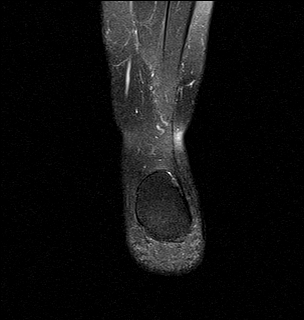
[im 47/47]
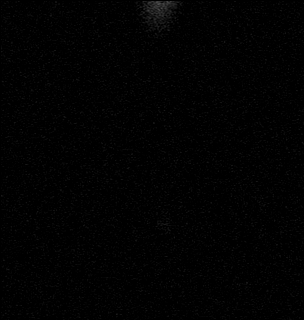

[Series 6: T1 · sagittal · 3.0mm · 0.47mm/px · 6 of 28 slices shown]
[im 1/28]
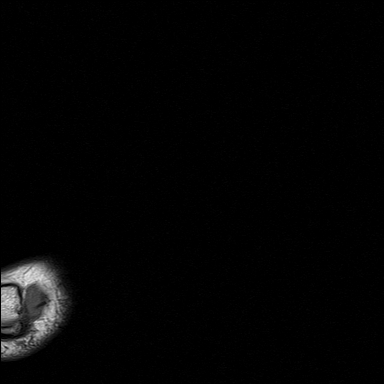
[im 6/28]
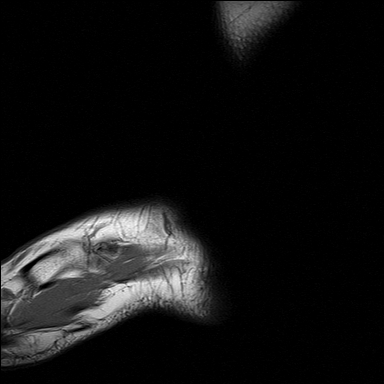
[im 11/28]
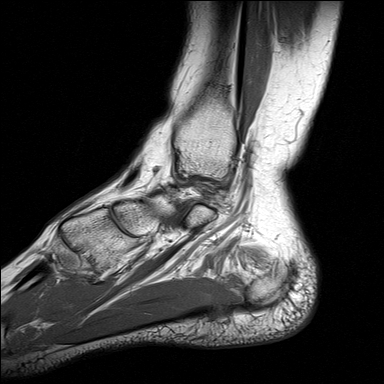
[im 17/28]
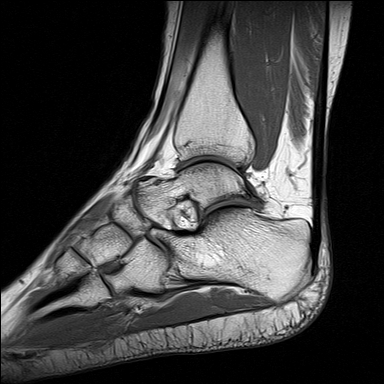
[im 22/28]
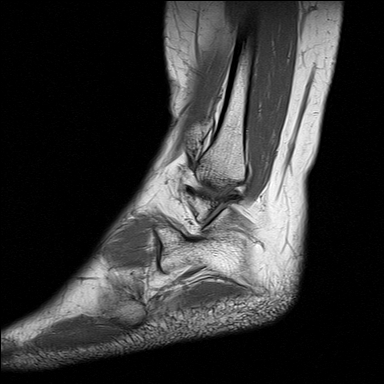
[im 28/28]
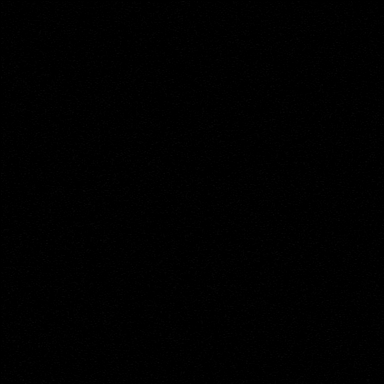

[Series 7: T2 fat-sat · sagittal · 3.0mm · 0.56mm/px · 6 of 28 slices shown (3 of 3)]
[im 1/28]
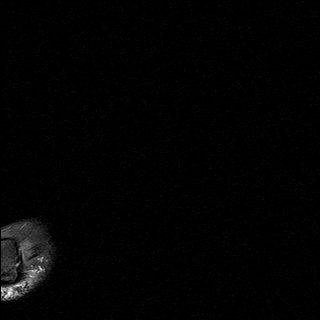
[im 6/28]
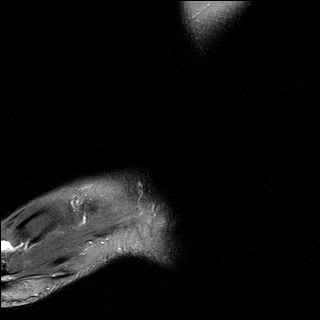
[im 11/28]
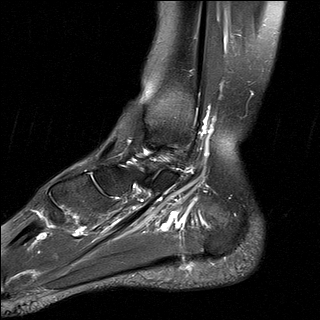
[im 17/28]
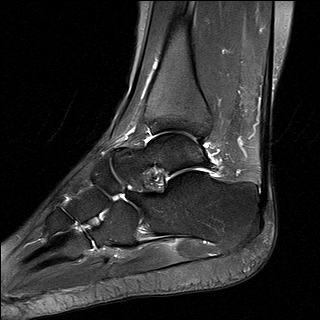
[im 22/28]
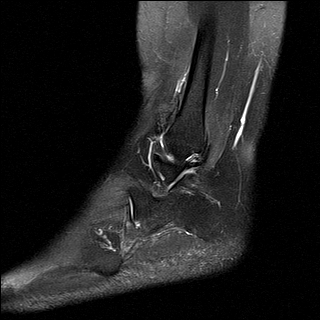
[im 28/28]
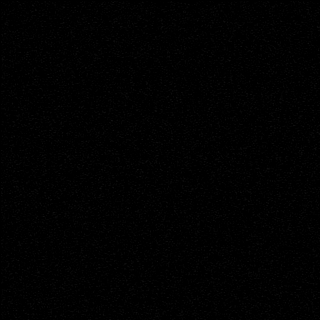

[40 of 40 positions shown; findings below may reference images not displayed]

FINDINGS: TENDONS

Peroneal: Intact.

Posteromedial: Intact.

Anterior: Intact.

Achilles: Intact.

Plantar Fascia: Appears normal.

LIGAMENTS

Lateral: Intact.

Medial: Intact.

CARTILAGE

Ankle Joint: Unremarkable.

Subtalar Joints/Sinus Tarsi: Unremarkable.

Bones: Normal marrow signal throughout.
IMPRESSION: Negative MRI right ankle. Negative for plantar fasciitis. No
abnormality to explain the patient's symptoms is identified.

## 2016-03-08 ENCOUNTER — Ambulatory Visit: Payer: Medicaid Other | Attending: Podiatry

## 2016-03-08 DIAGNOSIS — Z9181 History of falling: Secondary | ICD-10-CM | POA: Insufficient documentation

## 2016-03-08 DIAGNOSIS — M6281 Muscle weakness (generalized): Secondary | ICD-10-CM | POA: Diagnosis present

## 2016-03-08 DIAGNOSIS — M79661 Pain in right lower leg: Secondary | ICD-10-CM | POA: Insufficient documentation

## 2016-03-08 DIAGNOSIS — M25571 Pain in right ankle and joints of right foot: Secondary | ICD-10-CM | POA: Diagnosis present

## 2016-03-08 DIAGNOSIS — R262 Difficulty in walking, not elsewhere classified: Secondary | ICD-10-CM | POA: Diagnosis present

## 2016-03-08 NOTE — Patient Instructions (Signed)
  Calf Stretch    On your back with your left knee bent, and  with towel/strap around forefoot, keep knee straight and pull back on towel/strap until a stretch is felt in the calf. Hold __5__ seconds. Repeat __10__ times. Perform 2 sets. Do _3___ sessions per day.  Copyright  VHI. All rights reserved.     Gently massage your heel and the bottom of your foot for about 5 minutes at a time, 3 times daily.  Pain free.

## 2016-03-08 NOTE — Therapy (Signed)
[facility] [address] [place], [address] Phone: [phone]   Fax:  [phone]  Physical Therapy Evaluation  Patient Details  Name: Holly French MRN: [id number] Date of Birth: 1976/04/07 Referring Provider: Ovid Curd, DPM  Encounter Date: 03/08/2016      PT End of Session - 03/08/16 1558    Visit Number 1   Number of Visits 4  eval + 3 visits   Date for PT Re-Evaluation 06/10/16   Authorization Type 1   Authorization Time Period of 4  eval + 3 visits   PT Start Time 1559  pt arrived late, went to [facility]   PT Stop Time 1703   PT Time Calculation (min) 64 min   Activity Tolerance Patient tolerated treatment well   Behavior During Therapy [facility] for tasks assessed/performed      Past Medical History:  Diagnosis Date  . Allergy   . Asthma   . Chronic urticaria   . Dermatitis   . Eczema   . Hayfever   . Hypertension   . Vitamin D deficiency     Past Surgical History:  Procedure Laterality Date  . ABDOMINAL HYSTERECTOMY    . EPF and gastroc recession Right 01/14/2016   Per MD notes  . laparoscopies     x3  . NASAL SINUS SURGERY     x3  . SPINAL FUSION     lumbar    There were no vitals filed for this visit.       Subjective Assessment - 03/08/16 1604    Subjective R ankle and foot: 5/10 current (pt sitting, also took her back pain medication earlier), 4/10 at best (when pt has stayed off it a little while). 9/10 at most for the past month (when her R foot gave out on her, did not have a boot on and decided to step down; her R foot decided not to work)    Pertinent History S/P R endoscopic plantar fasciotomy and gastroc recession on January 12, 2016 (per pt).  Pt states having falls afterwards due to her crutches. Everything is intact per pt. Pt wore a CAM boot with the gauze and ace wrap, then a soft cast with CAM boot for 7 days. The soft cast was removed last Thursday,  03/04/2016 and pt currently ambulating independently with her CAM boot. Stopped using her crutches 3 weeks ago. Next MD appointment is on 03/18/2016. Pt states having to keep her foot in the boot for now for 6 months. 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 Was given stretches by MD at home which involved ankle DF to promote flexibility.   Pt also states having back pain and had PT for her back following her back surgery 7 years ago (lower lumbar fusion). The surgery and PT helped but still has nerve damage. Has another disc out but is above where she had her back surgery.    Patient Stated Goals "To be able to walk without a boot, to be able to go back to work."   Currently in Pain? Yes   Pain Score 5    Pain Location Calf  R calf and foot   Pain Orientation Right   Pain Descriptors / Indicators Aching;Tightness;Numbness  numbness at toes and top of foot following surgery   Pain Type Surgical pain   Pain Onset More than a month ago   Pain Frequency Constant  Aggravating Factors  Walking (10 min tolerance), standing (15 min tolerance) , pressure on R foot.    Pain Relieving Factors No pressure on foot, propping her foot up at night.             [facility] PT Assessment - 03/08/16 1601      Assessment   Medical Diagnosis Plantar fasciitis, S/P surgery, swelling   Referring Provider Ovid Curd, DPM   Onset Date/Surgical Date 01/14/16   Next MD Visit 03/18/2016   Prior Therapy Has not had PT for her R foot surgery.      Precautions   Precaution Comments Per pt, MD wants pt to wear her CAM boot for 6 months.      Restrictions   Other Position/Activity Restrictions walk with CAM boot     Balance Screen   Has the patient fallen in the past 6 months Yes   How many times? 7  using crutches, also stepped down without CAM boot   Has the patient had a decrease in activity level because of a fear of falling?  No   Is the patient reluctant to leave  their home because of a fear of falling?  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 Surgical incision healing satisfactorily. Swelling.    Lower Extremity Functional Scale  33/80; Pt might have answered questions based on her wearing her CAM boot.      Posture/Postural Control   Posture Comments bilaterally protracted shoulders and neck, R iliac crest and greater trochanter slightly higher, R knee in slight flexion. Pt wearing a CAM boot R foot.      AROM   Right Ankle Dorsiflexion -12  with tightness; -5 degrees AAROM with tightness   Right Ankle Plantar Flexion 35  with slight pain which eases with rest     Strength   Right Hip Flexion 4+/5   Right Hip Extension 4/5   Right Hip External Rotation  4/5   Right Hip ABduction 5/5   Left Hip Flexion 4+/5   Left Hip Extension 4/5   Left Hip External Rotation 4/5   Left Hip ABduction 5/5   Right Knee Flexion 4/5   Right Knee Extension 5/5   Left Knee Flexion 4/5   Left Knee Extension 5/5   Right Ankle Dorsiflexion 4/5   Right Ankle Plantar Flexion 4+/5  manual resistance     Palpation   Palpation comment TTP R gastroc at the musculotendinous junction, slight tenderness medial plantar foot. Pins and needles with gentle pressure to Achilless tendon which ease with release of pressure.  R LE swelling.      Ambulation/Gait   Gait Comments increased L trunk rotation, decreased stance L LE. Pt wearing CAM boot R foot.       Objectives  There-ex  Directed patient with supine R gastroc stretch with strap 10x5 seconds. L foot in hooklying position to decrease low back discomfort.   Reviewed  and given as part of her HEP 10x2 with 5 second holds 3x/day. Pt demonstrated and verbalzied understanding   seated bilateral heel raises 10x2  Gentle soft tissue mobilization (self) to L plantar surface of foot to decrease soft tissue stiffness.  Reviewed HEP. Pt demonstrated and verbalized understanding.    Improved exercise technique, movement at target joints, use of target muscles after mod verbal, visual, tactile cues.                       PT Education - 03/08/16 1952    Education provided Yes   Education Details ther-ex, HEP, plan of care   Person(s) Educated Patient   Methods Explanation;Demonstration;Tactile cues;Verbal cues;Handout   Comprehension Returned demonstration;Verbalized understanding             PT Long Term Goals - 03/08/16 2002      PT LONG TERM GOAL #1   Title Patient will improve walking tolerance to at least 20 min, and standing tolerance to at least 25 min with CAM boot to promote ability to perform standing tasks.    Baseline With CAM boot: walking tolerance 10 min, standing tolerance 15 min per patient reports.    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    Baseline Pt currently unable to work.    Time 13   Period Weeks   Status New     PT LONG TERM GOAL #3   Title Patient will increase R ankle DF AROM to 10 degrees to promote ability to ambulate without CAM boot when appropriate.   Baseline -12 degrees R ankle DF AROM   Time 13   Period Weeks   Status New     PT LONG TERM GOAL #4   Title Patient will improve her LEFS score by at least 9 points as a demonstration of improved function.    Baseline 33/80 (pt might have answered quesions based on her wearing a CAM boot)   Time 13   Period Weeks   Status New     PT LONG TERM GOAL #5   Title Patient  will improve her R ankle DF and PF strength by at least 1/2 MMT grade to promote ability to ambulate without a CAM boot when appropriate.    Baseline 4/5 DF, 4+/5 PF with manual resistance   Time 13   Period Weeks   Status New               Plan - 03/08/16 1953    Clinical Impression Statement Patient is a 40 year old female who came to physical therapy S/P R endoscopic plantar fasciotomy with gastroc recession on 01/14/16 (per MD note). She also presents with altered posture, R hip and ankle weakness; R leg, ankle and foot pain, TTP, and difficulty performing functional tasks such as walking and performing standing tasks such as carrying plates and dishes as a waitress. Pt will benefit from skilled physical therapy services to address the aforementioned deficits and to help her return to work.    Rehab Potential Good   Clinical Impairments Affecting Rehab Potential Healing time, low back pain   PT Frequency Other (comment)  1 visit every 3-4 weeks   PT Duration Other (comment)  13 weeks   PT Treatment/Interventions Therapeutic activities;Therapeutic exercise;Manual techniques;Dry needling;Passive range of motion;Neuromuscular re-education;Patient/family education;Gait training;Ultrasound;Electrical Stimulation;Cryotherapy;Aquatic Therapy;Iontophoresis 4mg /ml Dexamethasone   PT Next Visit  Plan ROM, stretches, manual therapy, soft tissue mobilization   Consulted and Agree with Plan of Care Patient      Patient will benefit from skilled therapeutic intervention in order to improve the following deficits and impairments:  Pain, Improper body mechanics, Postural dysfunction, Difficulty walking, Decreased strength, Decreased range of motion, Decreased balance  Visit Diagnosis: Pain in right lower leg - Plan: PT plan of care cert/re-cert  Pain in right ankle and joints of right foot - Plan: PT plan of care cert/re-cert  History of falling - Plan: PT plan of care cert/re-cert  Difficulty  in walking, not elsewhere classified - Plan: PT plan of care cert/re-cert  Muscle weakness (generalized) - Plan: PT plan of care cert/re-cert     Problem List Patient Active Problem List   Diagnosis Date Noted  . Plantar fasciitis 05/22/2015  . Preventative health care 05/02/2015  . Tobacco use 05/02/2015  . Breast cancer screening 05/02/2015  . Obesity 05/02/2015  . Endometriosis 01/16/2015  . Asthma 01/16/2015  . Eczema 01/16/2015  . Lumbago 01/16/2015  . Hypertension 01/16/2015  . Contact dermatitis 01/16/2015  . Allergic rhinitis 01/16/2015  . Vitamin D deficiency 01/16/2015  . Chronic urticaria 01/16/2015     Thank you for your referral.  Loralyn FreshwaterMiguel Burgundy Matuszak PT, DPT   03/08/2016, 8:20 PM  [place] [facility] [address] [place], [place], [address] Phone: [phone]   Fax:  [phone]  Name: Cheri RousLaura M Prell MRN: [id number] Date of Birth: 1975-08-15

## 2016-03-10 ENCOUNTER — Other Ambulatory Visit: Payer: Self-pay | Admitting: Podiatry

## 2016-03-18 ENCOUNTER — Encounter: Payer: Self-pay | Admitting: Podiatry

## 2016-03-18 ENCOUNTER — Ambulatory Visit (INDEPENDENT_AMBULATORY_CARE_PROVIDER_SITE_OTHER): Payer: Medicaid Other | Admitting: Podiatry

## 2016-03-18 ENCOUNTER — Ambulatory Visit (INDEPENDENT_AMBULATORY_CARE_PROVIDER_SITE_OTHER): Payer: Medicaid Other

## 2016-03-18 DIAGNOSIS — Z9889 Other specified postprocedural states: Secondary | ICD-10-CM

## 2016-03-18 DIAGNOSIS — R52 Pain, unspecified: Secondary | ICD-10-CM

## 2016-03-18 DIAGNOSIS — M722 Plantar fascial fibromatosis: Secondary | ICD-10-CM

## 2016-03-18 MED ORDER — HYDROCODONE-ACETAMINOPHEN 5-325 MG PO TABS: 1.0000 | ORAL_TABLET | ORAL | 0 refills | Status: DC | PRN

## 2016-03-20 NOTE — Progress Notes (Signed)
Subjective: Holly RousLaura M Milhorn is a 40 y.o. is seen today in office s/p right EPF and gastroc recession preformed on 01/14/16. She states that she is walking in the boot she does well and she wears the boot. She'll still has some difficulty trying to wear regular shoe. She has returned one of her jobs wearing the boot and is able to do this. 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 She states some numbness and tingling on the incisions were calf and the top of her foot. She has been going to physical therapy once every 3 weeks as her insurance only approve free treatments. No other complaints at this time. Denies any systemic complaints such as fevers, chills, nausea, vomiting. No calf pain, chest pain, shortness of breath.   Objective: General: No acute distress, AAOx3  DP/PT pulses palpable 2/4, CRT < 3 sec to all digits.  Protective sensation intact. Motor function intact.  Right foot/leg: Incision is well coapted without any evidence of dehiscence and scars have formed. There b is minimal tenderness to palpation to the heel. There is still some discomfort along the incision along the gastrocnemius recession site. There is subjective numbness and pins and needle sensation around the incisions were calf and the top of her foot however this is not appear to correlate any specific nerve distribution. There are no other areas of tenderness. No pain with calf compression, warmth, erythema.   Assessment and Plan:  Status post right foot EPF and gastroc recession  -Treatment options discussed including all alternatives, risks, and complications -I recommended her to continue with more aggressive physical therapy. We will try to peel this. She will bring by paper to the office over to by mouth the decision to increase physical therapy. Also she consented transition to regular shoe as tolerated. She can return to her second job as long she can wear  the boot if she feels that she can do this. Continue ice and elevation. We will start her back at her second job at half shifts.  -Follow-up in 4 weeks or sooner if any problems arise. In the meantime, encouraged to call the office with any questions, concerns, change in symptoms.   Ovid CurdMatthew Jadelyn Elks, DPM

## 2016-03-26 ENCOUNTER — Telehealth: Payer: Self-pay | Admitting: *Deleted

## 2016-03-26 NOTE — Telephone Encounter (Signed)
Received Medicaid Services Beneficiary Hearing Request Form. Unable to reach pt mailbox was full, to discuss the form and requirements from Dr. Ardelle AntonWagoner. Left message on pt's home line to call with explanation of what is necessary for the appeal of PT coverage.

## 2016-03-31 ENCOUNTER — Ambulatory Visit: Payer: Medicaid Other | Attending: Podiatry

## 2016-03-31 DIAGNOSIS — Z9181 History of falling: Secondary | ICD-10-CM | POA: Insufficient documentation

## 2016-03-31 DIAGNOSIS — M79661 Pain in right lower leg: Secondary | ICD-10-CM | POA: Insufficient documentation

## 2016-03-31 DIAGNOSIS — R262 Difficulty in walking, not elsewhere classified: Secondary | ICD-10-CM

## 2016-03-31 DIAGNOSIS — M6281 Muscle weakness (generalized): Secondary | ICD-10-CM | POA: Insufficient documentation

## 2016-03-31 DIAGNOSIS — M25571 Pain in right ankle and joints of right foot: Secondary | ICD-10-CM

## 2016-03-31 NOTE — Patient Instructions (Addendum)
   Standing with boot on:   Forward weight shifting.    L foot forward, shift your weight onto left foot, pushing off with your right foot    Repeat 10 times, perform 3 sets.    Repeat for your other side.    Standing with boot on:   Single leg balance   Stand on your right foot.   Hold for 10 seconds,   Repeat 10 times   Perform 3 sets daily.    With boot on:   Keep knees in neutral when going up and down steps.        Plantar Flexion: Resisted    Anchor behind, tubing around right foot, press down against yellow band. Repeat _10___ times per set. Do __3__ sets per session. Perform every other day.   http://orth.exer.us/11   Copyright  VHI. All rights reserved.    Dorsiflexion: Resisted    Facing anchor, tubing around left foot, pull toward face resisting yellow band Repeat ___10_ times per set. Do __3__ sets per session. Perform every other day.   http://orth.exer.us/9   Copyright  VHI. All rights reserved.

## 2016-03-31 NOTE — Therapy (Signed)
[place] [facility] [address] [place], [place], [address] Phone: [phone]   Fax:  [phone]  Physical Therapy Treatment  Patient Details  Name: Holly French MRN: [id number] Date of Birth: 04/09/1976 Referring Provider: Ovid CurdMatthew Wagoner, DPM  Encounter Date: 03/31/2016      PT End of Session - 03/31/16 1034    Visit Number 2   Number of Visits 4  eval + 3 visits   Date for PT Re-Evaluation 06/10/16   Authorization Type 2   Authorization Time Period of 4  eval + 3 visits   PT Start Time 1035  session started earlier than sign in secondary to back up at front desk   PT Stop Time 1115   PT Time Calculation (min) 40 min   Activity Tolerance Patient tolerated treatment well   Behavior [facility] for tasks assessed/performed      Past Medical History:  Diagnosis Date  . Allergy   . Asthma   . Chronic urticaria   . Dermatitis   . Eczema   . Hayfever   . Hypertension   . Vitamin D deficiency     Past Surgical History:  Procedure Laterality Date  . ABDOMINAL HYSTERECTOMY    . EPF and gastroc recession Right 01/14/2016   Per MD notes  . laparoscopies     x3  . NASAL SINUS SURGERY     x3  . SPINAL FUSION     lumbar    There were no vitals filed for this visit.      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 Wears her CAM boot. Her doctor is also trying to appeal the Medicaid decision to increase physical therapy visits. No pain in her R ankle and foot currently. Has pain towards the end of the day after being on it most of the day (7/10).  Pt states that she still has to wear her boot. Has to wear a night splint to keep her tendon healing the right way.  Feels numbness at the back of her leg and dorsum of her foot.    Pertinent History S/P R endoscopic plantar fasciotomy  and gastroc recession on January 12, 2016 (per pt).  Pt states having falls afterwards due to her crutches. Everything is intact per pt. Pt wore a CAM boot with the gauze and ace wrap, then a soft cast with CAM boot for 7 days. The soft cast was removed last Thursday, 03/04/2016 and pt currently ambulating independently with her CAM boot. Stopped using her crutches 3 weeks ago. Next MD appointment is on 03/18/2016. Pt states having to keep her foot in the boot for now for 6 months. 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 Was given stretches by MD at home which involved ankle DF to promote flexibility.   Pt also states having back pain and had PT for her back following her back surgery 7 years ago (lower lumbar fusion). The surgery and PT helped but still has nerve damage. Has another disc out but is above where she had her back surgery.    Patient Stated Goals "To be able to walk without a boot, to be able to go back to work."   Currently in Pain? No/denies   Pain Score 0-No  pain   Pain Onset More than a month ago                                 PT Education - 03/31/16 1115    Education provided Yes   Education Details ther-ex, HEP   Person(s) Educated Patient   Methods Explanation;Demonstration;Tactile cues;Verbal cues   Comprehension Returned demonstration;Verbalized understanding        Objectives   There-ex  With CAM boot on:  Directed patient with standing forward weight shifting onto front foot in gentle lunge stance 10x 3 each side  SLS R LE 10x10 seconds   Forward step ups onto 1st regular step 10x with cues for femoral control, using R LE  Ascending and descending 4 regular steps with bilateral UE assist with emphasis on femoral control 3x   Seated without CAM boot on:  Seated ankle PF resisting yellow band 10x3  seated ankle DF resisting yellow band 10x3  Reviewed HEP.    Improved exercise technique,  movement at target joints, use of target muscles after mod verbal, visual, tactile cues.     Patient currently 11 weeks post op. Good gentle activation of of R gastroc muscle use felt with exercises. Worked on gentle calf muscle strengthening today in preparation for activities without her boot on when appropriate. No complain of pain throughout session.            PT Long Term Goals - 03/08/16 2002      PT LONG TERM GOAL #1   Title Patient will improve walking tolerance to at least 20 min, and standing tolerance to at least 25 min with CAM boot to promote ability to perform standing tasks.    Baseline With CAM boot: walking tolerance 10 min, standing tolerance 15 min per patient reports.    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    Baseline Pt currently unable to work.    Time 13   Period Weeks   Status New     PT LONG TERM GOAL #3   Title Patient will increase R ankle DF AROM to 10 degrees to promote ability to ambulate without CAM boot when appropriate.   Baseline -12 degrees R ankle DF AROM   Time 13   Period Weeks   Status New     PT LONG TERM GOAL #4   Title Patient will improve her LEFS score by at least 9 points as a demonstration of improved function.    Baseline 33/80 (pt might have answered quesions based on her wearing a CAM boot)   Time 13   Period Weeks   Status New     PT LONG TERM GOAL #5   Title Patient will improve her R ankle DF and PF strength by at least 1/2 MMT grade to promote ability to ambulate without a CAM boot when appropriate.    Baseline 4/5 DF, 4+/5 PF with manual resistance   Time 13   Period Weeks   Status New               Plan - 03/31/16 1034    Clinical Impression Statement Patient currently 11 weeks post op. Good gentle  activation of of R gastroc  muscle use felt with exercises. Worked on gentle calf muscle strengthening today in preparation for activities without her boot on when appropriate. No complain of pain throughout session.    Rehab Potential Good   Clinical Impairments Affecting Rehab Potential Healing time, low back pain   PT Frequency Other (comment)  1 visit every 3-4 weeks   PT Duration Other (comment)  13 weeks   PT Treatment/Interventions Therapeutic activities;Therapeutic exercise;Manual techniques;Dry needling;Passive range of motion;Neuromuscular re-education;Patient/family education;Gait training;Ultrasound;Electrical Stimulation;Cryotherapy;Aquatic Therapy;Iontophoresis 4mg /ml Dexamethasone   PT Next Visit Plan ROM, stretches, manual therapy, soft tissue mobilization   Consulted and Agree with Plan of Care Patient      Patient will benefit from skilled therapeutic intervention in order to improve the following deficits and impairments:  Pain, Improper body mechanics, Postural dysfunction, Difficulty walking, Decreased strength, Decreased range of motion, Decreased balance  Visit Diagnosis: Pain in right ankle and joints of right foot  History of falling  Pain in right lower leg  Difficulty in walking, not elsewhere classified  Muscle weakness (generalized)     Problem List Patient Active Problem List   Diagnosis Date Noted  . Plantar fasciitis 05/22/2015  . Preventative health care 05/02/2015  . Tobacco use 05/02/2015  . Breast cancer screening 05/02/2015  . Obesity 05/02/2015  . Endometriosis 01/16/2015  . Asthma 01/16/2015  . Eczema 01/16/2015  . Lumbago 01/16/2015  . Hypertension 01/16/2015  . Contact dermatitis 01/16/2015  . Allergic rhinitis 01/16/2015  . Vitamin D deficiency 01/16/2015  . Chronic urticaria 01/16/2015    Loralyn Freshwater PT, DPT   03/31/2016, 12:32 PM  [place] [facility] AND SPORTS MEDICINE [address] [place],  [address] Phone: [phone]   Fax:  [phone]  Name: Holly French MRN: [id number] Date of Birth: Feb 06, 1976

## 2016-04-07 ENCOUNTER — Ambulatory Visit: Payer: Medicaid Other

## 2016-04-07 DIAGNOSIS — M25571 Pain in right ankle and joints of right foot: Secondary | ICD-10-CM

## 2016-04-07 DIAGNOSIS — M6281 Muscle weakness (generalized): Secondary | ICD-10-CM

## 2016-04-07 DIAGNOSIS — Z9181 History of falling: Secondary | ICD-10-CM

## 2016-04-07 DIAGNOSIS — R262 Difficulty in walking, not elsewhere classified: Secondary | ICD-10-CM

## 2016-04-07 DIAGNOSIS — M79661 Pain in right lower leg: Secondary | ICD-10-CM

## 2016-04-07 NOTE — Therapy (Signed)
[place] [facility] [address], [place], [address] Phone: [phone]   Fax:  [phone]  Physical Therapy Treatment  Patient Details  Name: Holly French MRN: [id number] Date of Birth: Mar 16, 1976 Referring Provider: Ovid Curd, DPM  Encounter Date: 04/07/2016      PT End of Session - 04/07/16 0947    Visit Number 3   Number of Visits 4  eval + 3 visits   Date for PT Re-Evaluation 06/10/16   Authorization Type 3   Authorization Time Period of 4  eval + 3 visits   PT Start Time [phone]   PT Stop Time 1040   PT Time Calculation (min) 52 min   Activity Tolerance Patient tolerated treatment well   Behavior During Therapy [facility] for tasks assessed/performed      Past Medical History:  Diagnosis Date  . Allergy   . Asthma   . Chronic urticaria   . Dermatitis   . Eczema   . Hayfever   . Hypertension   . Vitamin D deficiency     Past Surgical History:  Procedure Laterality Date  . ABDOMINAL HYSTERECTOMY    . EPF and gastroc recession Right 01/14/2016   Per MD notes  . laparoscopies     x3  . NASAL SINUS SURGERY     x3  . SPINAL FUSION     lumbar    There were no vitals filed for this visit.      Subjective Assessment - 04/07/16 0951    Subjective R foot is sore. Today is going to be my 8th day at work (both jobs). 7/10 currently. Going to work from 11:30 am to 4 pm and 4:15 pm to 9 pm.    Pertinent History S/P R endoscopic plantar fasciotomy and gastroc recession on January 12, 2016 (per pt).  Pt states having falls afterwards due to her crutches. Everything is intact per pt. Pt wore a CAM boot with the gauze and ace wrap, then a soft cast with CAM boot for 7 days. The soft cast was removed last Thursday, 03/04/2016 and pt currently ambulating independently with her CAM boot. Stopped using her crutches 3 weeks ago. Next MD appointment is on 03/18/2016. Pt states having to keep her foot in the boot  for now for 6 months. 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 Was given stretches by MD at home which involved ankle DF to promote flexibility.   Pt also states having back pain and had PT for her back following her back surgery 7 years ago (lower lumbar fusion). The surgery and PT helped but still has nerve damage. Has another disc out but is above where she had her back surgery.    Patient Stated Goals "To be able to walk without a boot, to be able to go back to work."   Currently in Pain? Yes   Pain Score 7    Pain Onset More than a month ago                                 PT Education - 04/07/16 1039    Education provided Yes   Education Details HEP, ther-ex   Person(s) Educated Patient   Methods Explanation;Demonstration;Tactile cues;Verbal cues;Handout   Comprehension Verbalized understanding;Returned demonstration        Objectives   Supine gastroc flexibility: R  ankle DF: -25 degrees AROM, -15 degrees AAROM at start of session. No TTP with calf squeeze   Manual therapy  STM R gastroc, heel, plantar fascia Manual ankle DF stretch Manual toe (all)  DF stretch A to P to R ankle grade 3 to promote mobility   Supine gastroc flexibility: -19 degrees AROM, -5 degrees AAROM after manual therapy    There-ex  Supine ankle DF 30x  Standing weight shifting onto R LE, upright posture 10x5 seconds for 2 sets with bilateral UE assist  Long sitting resisted ankle EV using yellow band 10x2 with 5 second holds  Reviewed HEP (exercise and soft tissue to gastroc, heel, and foot). Please see patient instructions. Pt demonstrated and verbalized understanding.   Improved exercise technique, movement at target joints, use of target muscles after min to mod verbal, visual, tactile cues.     Improved R ankle DF (gastroc flexibility) following manual therapy to promote soft tissue mobility to gastroc, heel, plantar  surface of foot as well as joint mobility at her talocrural joint, and ankle DF stretches.         PT Long Term Goals - 03/08/16 2002      PT LONG TERM GOAL #1   Title Patient will improve walking tolerance to at least 20 min, and standing tolerance to at least 25 min with CAM boot to promote ability to perform standing tasks.    Baseline With CAM boot: walking tolerance 10 min, standing tolerance 15 min per patient reports.    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    Baseline Pt currently unable to work.    Time 13   Period Weeks   Status New     PT LONG TERM GOAL #3   Title Patient will increase R ankle DF AROM to 10 degrees to promote ability to ambulate without CAM boot when appropriate.   Baseline -12 degrees R ankle DF AROM   Time 13   Period Weeks   Status New     PT LONG TERM GOAL #4   Title Patient will improve her LEFS score by at least 9 points as a demonstration of improved function.    Baseline 33/80 (pt might have answered quesions based on her wearing a CAM boot)   Time 13   Period Weeks   Status New     PT LONG TERM GOAL #5   Title Patient will improve her R ankle DF and PF strength by at least 1/2 MMT grade to promote ability to ambulate without a CAM boot when appropriate.    Baseline 4/5 DF, 4+/5 PF with manual resistance   Time 13   Period Weeks   Status New               Plan - 04/07/16 1259    Clinical Impression Statement Improved R ankle DF (gastroc flexibility) following manual therapy to promote soft tissue mobility to gastroc, heel, plantar surface of foot as well as joint mobility at her talocrural joint, and ankle DF stretches.    Rehab Potential Good   Clinical Impairments Affecting Rehab Potential Healing time, low back pain   PT Frequency Other (comment)   1 visit every 3-4 weeks   PT Duration Other (  comment)  13 weeks   PT Treatment/Interventions Therapeutic activities;Therapeutic exercise;Manual techniques;Dry needling;Passive range of motion;Neuromuscular re-education;Patient/family education;Gait training;Ultrasound;Electrical Stimulation;Cryotherapy;Aquatic Therapy;Iontophoresis 4mg /ml Dexamethasone   PT Next Visit Plan ROM, stretches, manual therapy, soft tissue mobilization   Consulted and Agree with Plan of Care Patient      Patient will benefit from skilled therapeutic intervention in order to improve the following deficits and impairments:  Pain, Improper body mechanics, Postural dysfunction, Difficulty walking, Decreased strength, Decreased range of motion, Decreased balance  Visit Diagnosis: Pain in right ankle and joints of right foot  History of falling  Pain in right lower leg  Difficulty in walking, not elsewhere classified  Muscle weakness (generalized)     Problem List Patient Active Problem List   Diagnosis Date Noted  . Plantar fasciitis 05/22/2015  . Preventative health care 05/02/2015  . Tobacco use 05/02/2015  . Breast cancer screening 05/02/2015  . Obesity 05/02/2015  . Endometriosis 01/16/2015  . Asthma 01/16/2015  . Eczema 01/16/2015  . Lumbago 01/16/2015  . Hypertension 01/16/2015  . Contact dermatitis 01/16/2015  . Allergic rhinitis 01/16/2015  . Vitamin D deficiency 01/16/2015  . Chronic urticaria 01/16/2015    Loralyn FreshwaterMiguel Esmirna Ravan PT, DPT   04/07/2016, 2:16 PM  [facility] [address] [place], [place], [id number] Phone: [phone]   Fax:  [phone]  Name: Holly French MRN: [id number] Date of Birth: July 30, 1975

## 2016-04-07 NOTE — Patient Instructions (Addendum)
  Gently massage your calf, heel, bottom of your foot.     Sitting on a chair, gently bend your toes and ankle up to feel a stretch.    Hold for 5 seconds, repeat 10 times, perform 3 sets daily.    Standing and holding onto something sturdy for support:  Shift your weight onto your right foot.   Do not let your ankle turn in.   5 second holds, 10 times, for 3 sets daily.    Eversion: Resisted    With right foot in tubing loop, hold tubing around other foot to resist and turn foot out. Hold for 5 seconds. Repeat _10___ times per set. Do ___3_ sets per session. Do ___1_ sessions per day.  http://orth.exer.us/14   Copyright  VHI. All rights reserved.

## 2016-04-15 ENCOUNTER — Ambulatory Visit (INDEPENDENT_AMBULATORY_CARE_PROVIDER_SITE_OTHER): Payer: Self-pay | Admitting: Podiatry

## 2016-04-15 ENCOUNTER — Encounter: Payer: Medicaid Other | Admitting: Podiatry

## 2016-04-15 DIAGNOSIS — M216X1 Other acquired deformities of right foot: Secondary | ICD-10-CM

## 2016-04-15 DIAGNOSIS — Z9889 Other specified postprocedural states: Secondary | ICD-10-CM

## 2016-04-15 DIAGNOSIS — M722 Plantar fascial fibromatosis: Secondary | ICD-10-CM

## 2016-04-21 ENCOUNTER — Other Ambulatory Visit: Payer: Self-pay | Admitting: Unknown Physician Specialty

## 2016-04-22 NOTE — Progress Notes (Signed)
Subjective: Holly French is a 40 y.o. is seen today in office s/p right EPF and gastroc recession preformed on 01/14/16. Patient states that she is getting better although she still having some numbness in her leg that she believes is from a nerve block. Overall she feels that she is doing better but still having some discomfort. She has returned to both of her jobs wearing the cam boot. Denies any systemic complaints such as fevers, chills, nausea, vomiting. No calf pain, chest pain, shortness of breath.   Objective: General: No acute distress, AAOx3  DP/PT pulses palpable 2/4, CRT < 3 sec to all digits.  Protective sensation intact. Motor function intact. Subjective there is mild numbness to the top of her foot into the calf. She does still its getting better slowly. Right foot/leg: Incision is well coapted without any evidence of dehiscence and scars have formed. There is minimal tenderness to palpation to the heel. There is still some mild discomfort along the incision along the gastrocnemius recession site.  There are no other areas of tenderness. No pain with calf compression, warmth, erythema.   Assessment and Plan:  Status post right foot EPF and gastroc recession  -Treatment options discussed including all alternatives, risks, and complications -Recommended continuing physical therapy. -Ankle brace and anklet were dispensed and she started transition to this as tolerated. Discussed with her shoe gear changes and orthotics as well. -Pain medication as needed. -Ice and elevation -Follow-up as scheduled or sooner if needed. Call any questions or concerns in the meantime.  Ovid CurdMatthew Wagoner, DPM

## 2016-04-30 ENCOUNTER — Telehealth: Payer: Self-pay | Admitting: *Deleted

## 2016-04-30 NOTE — Telephone Encounter (Addendum)
Holly French called 04/29/2016 with questions concerning PT for pt. 04/30/2016-[facility] receptionist state physical therapist are not in office on Friday. 05/11/2016-Holly French - [facility] states he would like updated orders for pt. Pt states she is to be in the cam boot x 6 mos, can she be out of the cam boot to perform weight bearing exercises, can she be weaned from the cam boot and when, and are there any special instructions or limitations. Holly French request new orders to be faxed to [phone]. Fax[facility]agoner[facility]

## 2016-05-03 ENCOUNTER — Encounter: Payer: Medicaid Other | Admitting: Family Medicine

## 2016-05-04 ENCOUNTER — Encounter: Payer: Self-pay | Admitting: Unknown Physician Specialty

## 2016-05-04 ENCOUNTER — Ambulatory Visit (INDEPENDENT_AMBULATORY_CARE_PROVIDER_SITE_OTHER): Payer: Self-pay | Admitting: Unknown Physician Specialty

## 2016-05-04 VITALS — BP 131/85 | HR 130 | Temp 98.3°F | Ht 62.5 in | Wt 217.0 lb

## 2016-05-04 DIAGNOSIS — I10 Essential (primary) hypertension: Secondary | ICD-10-CM

## 2016-05-04 DIAGNOSIS — Z0000 Encounter for general adult medical examination without abnormal findings: Secondary | ICD-10-CM

## 2016-05-04 NOTE — Progress Notes (Signed)
BP 131/85 (BP Location: Left Arm, Patient Position: Sitting, Cuff Size: Large)   Pulse (!) 130   Temp 98.3 F (36.8 C)   Ht 5' 2.5" (1.588 m)   Wt 217 lb (98.4 kg)   LMP 02/25/2009 (Approximate)   SpO2 97%   BMI 39.06 kg/m    Subjective:    Patient ID: Holly French, female    DOB: Oct 07, 1975, 40 y.o.   MRN: [id number]  HPI: Holly RousLaura M Morejon is a 40 y.o. female  Chief Complaint  Patient presents with  . Annual Exam   Hypertension Using medications without difficulty Average home BPs "up and down"  No problems or lightheadedness No chest pain with exertion or shortness of breath No Edema  [place]  Social History   Social History  . Marital status: Significant Other    Spouse name: N/A  . Number of children: N/A  . Years of education: N/A   Occupational History  . Not on file.   Social History Main Topics  . Smoking status: Current Every Day Smoker    Packs/day: 0.25    Years: 18.00    Types: Cigarettes  . Smokeless tobacco: Never Used  . Alcohol use No  . Drug use: No  . Sexual activity: Yes   Other Topics Concern  . Not on file   Social History Narrative  . No narrative on file   Family History  Problem Relation Age of Onset  . Diabetes Mother   . Hyperlipidemia Father   . Allergies Daughter   . COPD Maternal Grandmother   . Lupus Maternal Grandmother   . Cancer Maternal Grandfather     colon   Past Medical History:  Diagnosis Date  . Allergy   . Asthma   . Chronic urticaria   . Dermatitis   . Eczema   . Hayfever   . Hypertension   . Vitamin D deficiency    Past Surgical History:  Procedure Laterality Date  . ABDOMINAL HYSTERECTOMY    . EPF and gastroc recession Right 01/14/2016   Per MD notes  . laparoscopies     x3  . NASAL SINUS SURGERY     x3  . PLANTAR FASCIA RELEASE    . SPINAL FUSION     lumbar   Relevant past medical, surgical, family and social history reviewed and  updated as indicated. Interim medical history since our last visit reviewed. Allergies and medications reviewed and updated.  Review of Systems  Per HPI unless specifically indicated above     Objective:    BP 131/85 (BP Location: Left Arm, Patient Position: Sitting, Cuff Size: Large)   Pulse (!) 130   Temp 98.3 F (36.8 C)   Ht 5' 2.5" (1.588 m)   Wt 217 lb (98.4 kg)   LMP 02/25/2009 (Approximate)   SpO2 97%   BMI 39.06 kg/m   Wt Readings from Last 3 Encounters:  05/04/16 217 lb (98.4 kg)  11/17/15 213 lb (96.6 kg)  06/24/15 203 lb 3.2 oz (92.2 kg)    Physical Exam  Constitutional: She is oriented to person, place, and time. She appears well-developed and well-nourished.  HENT:  Head: Normocephalic and atraumatic.  Eyes: Pupils are equal, round, and reactive to light. Right eye exhibits no discharge. Left eye exhibits no discharge. No scleral icterus.  Neck: Normal range of motion. Neck supple. Carotid bruit is not present. No thyromegaly present.  Cardiovascular: Normal rate, regular rhythm and  normal heart sounds.  Exam reveals no gallop and no friction rub.   No murmur heard. Pulmonary/Chest: Effort normal and breath sounds normal. No respiratory distress. She has no wheezes. She has no rales.  Abdominal: Soft. Bowel sounds are normal. There is no tenderness. There is no rebound.  Genitourinary: No breast swelling, tenderness or discharge.  Musculoskeletal: Normal range of motion.  Lymphadenopathy:    She has no cervical adenopathy.  Neurological: She is alert and oriented to person, place, and time.  Skin: Skin is warm, dry and intact. No rash noted.  Psychiatric: She has a normal mood and affect. Her speech is normal and behavior is normal. Judgment and thought content normal. Cognition and memory are normal.    Results for orders placed or performed in visit on 05/02/15  CBC with Differential/Platelet  Result Value Ref Range   WBC 6.4 3.4 - 10.8 x10E3/uL   RBC  4.04 3.77 - 5.28 x10E6/uL   Hemoglobin 13.0 11.1 - 15.9 g/dL   Hematocrit 52.837.4 41.334.0 - 46.6 %   MCV 93 79 - 97 fL   MCH 32.2 26.6 - 33.0 pg   MCHC 34.8 31.5 - 35.7 g/dL   RDW 24.413.4 01.012.3 - 27.215.4 %   Platelets 286 150 - 379 x10E3/uL   Neutrophils 56 %   Lymphs 28 %   Monocytes 7 %   Eos 7 %   Basos 1 %   Neutrophils Absolute 3.7 1.4 - 7.0 x10E3/uL   Lymphocytes Absolute 1.8 0.7 - 3.1 x10E3/uL   Monocytes Absolute 0.5 0.1 - 0.9 x10E3/uL   EOS (ABSOLUTE) 0.4 0.0 - 0.4 x10E3/uL   Basophils Absolute 0.0 0.0 - 0.2 x10E3/uL   Immature Granulocytes 1 %   Immature Grans (Abs) 0.0 0.0 - 0.1 x10E3/uL  Lipid Panel w/o Chol/HDL Ratio  Result Value Ref Range   Cholesterol, Total 190 100 - 199 mg/dL   Triglycerides 536102 0 - 149 mg/dL   HDL 54 >64>39 mg/dL   VLDL Cholesterol Cal 20 5 - 40 mg/dL   LDL Calculated 403116 (H) 0 - 99 mg/dL  Comprehensive metabolic panel  Result Value Ref Range   Glucose 90 65 - 99 mg/dL   BUN 11 6 - 20 mg/dL   Creatinine, Ser 4.740.72 0.57 - 1.00 mg/dL   GFR calc non Af Amer 107 >59 mL/min/1.73   GFR calc Af Amer 124 >59 mL/min/1.73   BUN/Creatinine Ratio 15 8 - 20   Sodium 138 136 - 144 mmol/L   Potassium 4.8 3.5 - 5.2 mmol/L   Chloride 99 97 - 106 mmol/L   CO2 25 18 - 29 mmol/L   Calcium 9.1 8.7 - 10.2 mg/dL   Total Protein 6.3 6.0 - 8.5 g/dL   Albumin 4.1 3.5 - 5.5 g/dL   Globulin, Total 2.2 1.5 - 4.5 g/dL   Albumin/Globulin Ratio 1.9 1.1 - 2.5   Bilirubin Total 0.3 0.0 - 1.2 mg/dL   Alkaline Phosphatase 75 39 - 117 IU/L   AST 16 0 - 40 IU/L   ALT 20 0 - 32 IU/L  TSH  Result Value Ref Range   TSH 1.630 0.450 - 4.500 uIU/mL      Assessment & Plan:   Problem List Items Addressed This Visit      Unprioritized   Hypertension - Primary   Relevant Orders   Comprehensive metabolic panel   Lipid Panel w/o Chol/HDL Ratio   TSH    Other Visit Diagnoses    Routine general  medical examination at a health care facility       Relevant Orders   TSH        Follow up plan: Return in about 6 months (around 11/01/2016).

## 2016-05-05 LAB — COMPREHENSIVE METABOLIC PANEL
ALT: 19 IU/L (ref 0–32)
AST: 17 IU/L (ref 0–40)
Albumin/Globulin Ratio: 1.9 (ref 1.2–2.2)
Albumin: 4.5 g/dL (ref 3.5–5.5)
Alkaline Phosphatase: 79 IU/L (ref 39–117)
BUN/Creatinine Ratio: 11 (ref 9–23)
BUN: 8 mg/dL (ref 6–20)
Bilirubin Total: 0.2 mg/dL (ref 0.0–1.2)
CALCIUM: 9.8 mg/dL (ref 8.7–10.2)
CHLORIDE: 97 mmol/L (ref 96–106)
CO2: 24 mmol/L (ref 18–29)
Creatinine, Ser: 0.7 mg/dL (ref 0.57–1.00)
GFR, EST AFRICAN AMERICAN: 127 mL/min/{1.73_m2} (ref >59)
GFR, EST NON AFRICAN AMERICAN: 110 mL/min/{1.73_m2} (ref >59)
GLUCOSE: 75 mg/dL (ref 65–99)
Globulin, Total: 2.4 g/dL (ref 1.5–4.5)
Potassium: 4.5 mmol/L (ref 3.5–5.2)
Sodium: 137 mmol/L (ref 134–144)
TOTAL PROTEIN: 6.9 g/dL (ref 6.0–8.5)

## 2016-05-05 LAB — TSH: TSH: 1.51 u[IU]/mL (ref 0.450–4.500)

## 2016-05-05 LAB — LIPID PANEL W/O CHOL/HDL RATIO
Cholesterol, Total: 215 mg/dL — ABNORMAL HIGH (ref 100–199)
HDL: 57 mg/dL (ref >39)
LDL Calculated: 123 mg/dL — ABNORMAL HIGH (ref 0–99)
TRIGLYCERIDES: 177 mg/dL — AB (ref 0–149)
VLDL CHOLESTEROL CAL: 35 mg/dL (ref 5–40)

## 2016-05-05 NOTE — Progress Notes (Signed)
Notified pt by mychart

## 2016-05-11 NOTE — Telephone Encounter (Signed)
Ok to send new orders. Yes, she can get out of the CAM boot and into a regular shoe. She can start this now.

## 2016-05-13 ENCOUNTER — Encounter: Payer: Self-pay | Admitting: Podiatry

## 2016-05-13 ENCOUNTER — Ambulatory Visit (INDEPENDENT_AMBULATORY_CARE_PROVIDER_SITE_OTHER): Payer: Self-pay | Admitting: Podiatry

## 2016-05-13 DIAGNOSIS — M722 Plantar fascial fibromatosis: Secondary | ICD-10-CM

## 2016-05-13 DIAGNOSIS — M216X1 Other acquired deformities of right foot: Secondary | ICD-10-CM

## 2016-05-13 NOTE — Progress Notes (Addendum)
Subjective: Holly RousLaura M Townley is a 40 y.o. is seen today in office s/p right EPF and gastroc recession preformed on 01/14/16. She states that overall she is doing better and she has more good days than bad days and she was having before the surgery. She's like on physical therapy 3 times. She's been doing stretching at home intermittently. She does not wear the night splint. She states her calf muscle is becoming tight again. 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 She is asking she can go back to doing this as well. DThe numbness and tingling she is having has improved.enies any systemic complaints such as fevers, chills, nausea, vomiting. No calf pain, chest pain, shortness of breath.   Objective: General: No acute distress, AAOx3  DP/PT pulses palpable 2/4, CRT < 3 sec to all digits.  Protective sensation intact. Motor function intact. Subjective there is mild numbness to the top of her foot into the calf. She does still its getting better slowly. Right foot/leg: Incision is well coapted without any evidence of dehiscence and scars have formed. There isno significantenderness to palpation to the heel.  there is no pain to the gastrocnemius recession site today. However she does have recurrence of equinus. There are no other areas of tenderness to bilateral lower extremity is. . No pain with calf compression, warmth, erythema.   Assessment and Plan:  Status post right foot EPF and gastroc recession  -Treatment options discussed including all alternatives, risks, and complications -At this time I recommended continued to be more consistent with stretching, icing at home as well as going that with a night splint which she has not been doing. Also recommend more physical therapy. However due to her work schedule and personal issues she is unable to go as regular. Because of this recommend doing this every day at home. 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  -Continue supportive shoe gear and compression anklet for swelling. -Follow-up as scheduled or sooner if needed. Call any questions or concerns in the meantime  Ovid CurdMatthew Kelyn Koskela, DPM

## 2016-05-18 ENCOUNTER — Ambulatory Visit: Payer: Medicaid Other

## 2016-05-18 ENCOUNTER — Other Ambulatory Visit: Payer: Self-pay | Admitting: Unknown Physician Specialty

## 2016-05-19 ENCOUNTER — Ambulatory Visit: Payer: Medicaid Other | Attending: Podiatry

## 2016-05-19 DIAGNOSIS — M6281 Muscle weakness (generalized): Secondary | ICD-10-CM | POA: Insufficient documentation

## 2016-05-19 DIAGNOSIS — M79661 Pain in right lower leg: Secondary | ICD-10-CM | POA: Insufficient documentation

## 2016-05-19 DIAGNOSIS — M25571 Pain in right ankle and joints of right foot: Secondary | ICD-10-CM

## 2016-05-19 DIAGNOSIS — R262 Difficulty in walking, not elsewhere classified: Secondary | ICD-10-CM | POA: Insufficient documentation

## 2016-05-19 DIAGNOSIS — Z9181 History of falling: Secondary | ICD-10-CM | POA: Insufficient documentation

## 2016-05-19 NOTE — Therapy (Signed)
[facility] [address], [place], [address] Phone: [phone]   Fax:  [phone]  Physical Therapy Treatment  Patient Details  Name: Holly French MRN: [id number] Date of Birth: 10-20-1975 Referring Provider: Celesta Gentile, DPM  Encounter Date: 05/19/2016      PT End of Session - 05/19/16 0909    Visit Number 4   Number of Visits 7   Date for PT Re-Evaluation 08/12/16   Authorization Type 4   Authorization Time Period of 4  eval + 3 visits   PT Start Time 0910   PT Stop Time 1011   PT Time Calculation (min) 61 min   Activity Tolerance Patient tolerated treatment well   Behavior During Therapy [facility] for tasks assessed/performed      Past Medical History:  Diagnosis Date  . Allergy   . Asthma   . Chronic urticaria   . Dermatitis   . Eczema   . Hayfever   . Hypertension   . Vitamin D deficiency     Past Surgical History:  Procedure Laterality Date  . ABDOMINAL HYSTERECTOMY    . EPF and gastroc recession Right 01/14/2016   Per MD notes  . laparoscopies     x3  . NASAL SINUS SURGERY     x3  . PLANTAR FASCIA RELEASE    . SPINAL FUSION     lumbar    There were no vitals filed for this visit.      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 Just got out of working 9 days straight between both jobs. R ankle feels sore. 8/10 soreness currently. Currenlty wearing a compression sleeve (like a soft ankle brace) and  a strap on ankle brace R ankle from her surgeon.    Pertinent History S/P R endoscopic plantar fasciotomy and gastroc recession on January 12, 2016 (per pt).  Pt states having falls afterwards due to her crutches. Everything is intact per pt. Pt wore a CAM boot with the gauze and ace wrap, then a soft cast with CAM boot for 7 days. The soft cast was removed last Thursday, 03/04/2016 and pt  currently ambulating independently with her CAM boot. Stopped using her crutches 3 weeks ago. Next MD appointment is on 03/18/2016. Pt states having to keep her foot in the boot for now for 6 months. 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 Was given stretches by MD at home which involved ankle DF to promote flexibility.   Pt also states having back pain and had PT for her back following her back surgery 7 years ago (lower lumbar fusion). The surgery and PT helped but still has nerve damage. Has another disc out but is above where she had her back surgery.    Patient Stated Goals "To be able to walk without a boot, to be able to go back to work."   Currently in Pain? Yes   Pain Score 8    Pain Location Ankle   Pain Orientation Right   Pain Descriptors / Indicators Sore  R medial heel, and leg and gastroc   Pain Onset More than a month ago  PT Education - 05/19/16 1018    Education provided Yes   Education Details ther-ex, HEP, plan of care   Person(s) Educated Patient   Methods Explanation;Demonstration;Tactile cues;Verbal cues   Comprehension Verbalized understanding;Returned demonstration        Objectives    There-ex   Reviewed progress/current status towards goals.  Reviewed plan of care continue 3 more visits in 2018 if approved by insurance.   Standing R gastroc stretch 30 seconds x 3  R ankle DF resisting red band 10x2 with 5 second holds (upgrade). Improved R ankle DF AROM observed. Reviewed and given as part of her HEP 10x3 with 5 second holds  SLS on R LE with light touch assist to no UE assist 10 seconds     Improved exercise technique, movement at target joints, use of target muscles after min verbal, visual, tactile cues.      Manual therapy  Supine A to P to R ankle joint  DF AROM improved to 0 degrees   STM R gastroc, heel, plantar fascia  Manual ankle  DF stretch        Pt demonstrates limited R ankle DF AROM secondary to stiffness at her ankle joint and gastroc muscle tightness. R ankle DF however improved following manual therapy to promote ankle joint mobility and exercises working on tibialis anterior muscle strength. Pt still demonstrates R ankle pain and soreness, limited AROM, gastroc tightness and ankle joint stiffness, ankle weakness, and difficulty performing functional tasks such as prolonged walking at work due to the aforementioned limitations and would benefit from continued skilled physical therapy services to address the deficits.          PT Long Term Goals - 05/19/16 0917      PT LONG TERM GOAL #1   Title Patient will improve walking tolerance to at least 20 min, and standing tolerance to at least 25 min with CAM boot to promote ability to perform standing tasks.    Baseline With CAM boot: walking tolerance 10 min, standing tolerance 15 min per patient reports. Pt progressed to a regular shoe with brace. Able to tolerate walking without boot but with ankle brace 15-20 min. 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    Baseline Pt currently unable to work. Pt returned to work, full schedule with her night job with regular shoe.    Time 13   Period Weeks   Status Achieved     PT LONG TERM GOAL #3   Title Patient will increase R ankle DF AROM to 10 degrees to promote ability to ambulate without CAM boot when appropriate.   Baseline -12 degrees R ankle DF AROM; -26 degrees R ankle DF AROM (-19 degrees AAROM; improved to 0 degrees AROM after manual therapy) 05/19/2016   Time 12   Period Weeks   Status On-going     PT LONG TERM GOAL #4   Title Patient will  improve her LEFS score by at least 9 points as a demonstration of improved function.    Baseline 33/80 (pt might have answered quesions based on her wearing a CAM boot)   Time 12  Period Weeks   Status Deferred     PT LONG TERM GOAL #5   Title Patient will improve her R ankle DF and PF strength by at least 1/2 MMT grade to promote ability to ambulate without a CAM boot when appropriate.    Baseline 4/5 DF, 4+/5 PF with manual resistance; 05/19/16 DF 4+/5, PF 5/5 manual resistance   Time 13   Period Weeks   Status Achieved     Additional Long Term Goals   Additional Long Term Goals Yes     PT LONG TERM GOAL #6   Title Patient will improve her R ankle DF strength to 5/5 MMT grade to promote ability to ambulate.    Baseline 4+/5 R ankle DF   Time 12   Period Weeks   Status New               Plan - 05/19/16 1713    Clinical Impression Statement Pt demonstrates limited R ankle DF AROM secondary to stiffness at her ankle joint and gastroc muscle tightness. R ankle DF however improved following manual therapy to promote ankle joint mobility and exercises working on tibialis anterior muscle strength. Pt still demonstrates R ankle pain and soreness, limited AROM, gastroc tightness and ankle joint stiffness, ankle weakness, and difficulty performing functional tasks such as prolonged walking at work due to the aforementioned limitations and would benefit from continued skilled physical therapy services to address the deficits.    Rehab Potential Good   Clinical Impairments Affecting Rehab Potential limited ROM, limited visits due to insurance   PT Frequency Other (comment)  1 visit every 3-4 weeks   PT Duration 12 weeks   PT Treatment/Interventions Therapeutic activities;Therapeutic exercise;Manual techniques;Dry needling;Passive range of motion;Neuromuscular re-education;Patient/family education;Gait training;Ultrasound;Electrical Stimulation;Cryotherapy;Aquatic Therapy;Iontophoresis  42m/ml Dexamethasone   PT Next Visit Plan Manual therapy, ROM, stretches, gait   Consulted and Agree with Plan of Care Patient      Patient will benefit from skilled therapeutic intervention in order to improve the following deficits and impairments:  Pain, Improper body mechanics, Postural dysfunction, Difficulty walking, Decreased strength, Decreased range of motion, Decreased balance  Visit Diagnosis: Pain in right ankle and joints of right foot - Plan: PT plan of care cert/re-cert  History of falling - Plan: PT plan of care cert/re-cert  Pain in right lower leg - Plan: PT plan of care cert/re-cert  Difficulty in walking, not elsewhere classified - Plan: PT plan of care cert/re-cert  Muscle weakness (generalized) - Plan: PT plan of care cert/re-cert     Problem List Patient Active Problem List   Diagnosis Date Noted  . Plantar fasciitis 05/22/2015  . Preventative health care 05/02/2015  . Tobacco use 05/02/2015  . Breast cancer screening 05/02/2015  . Obesity 05/02/2015  . Endometriosis 01/16/2015  . Asthma 01/16/2015  . Eczema 01/16/2015  . Lumbago 01/16/2015  . Hypertension 01/16/2015  . Contact dermatitis 01/16/2015  . Allergic rhinitis 01/16/2015  . Vitamin D deficiency 01/16/2015  . Chronic urticaria 01/16/2015    MJoneen BoersPT, DPT  05/19/2016, 5:34 PM  [facility] [address] [place] [address]Phone: [phone]  Fax:  [phone] Name: LALLEY NEILSMRN: [id number] of Birth: 908/06/1975

## 2016-05-19 NOTE — Patient Instructions (Signed)
  Upgraded HEP resisted R ankle DF to red band 10x3 with 5 seconds.  Added SLS on R LE with light touch to no UE assist 10 seconds at a time (multiple times) working towards no ankle brace use.    Pt demonstrated and verbalized understanding.

## 2016-06-24 ENCOUNTER — Ambulatory Visit (INDEPENDENT_AMBULATORY_CARE_PROVIDER_SITE_OTHER): Payer: Self-pay | Admitting: Podiatry

## 2016-06-24 DIAGNOSIS — Z9889 Other specified postprocedural states: Secondary | ICD-10-CM

## 2016-06-24 DIAGNOSIS — M216X1 Other acquired deformities of right foot: Secondary | ICD-10-CM

## 2016-06-24 DIAGNOSIS — R21 Rash and other nonspecific skin eruption: Secondary | ICD-10-CM

## 2016-06-24 DIAGNOSIS — M722 Plantar fascial fibromatosis: Secondary | ICD-10-CM

## 2016-06-24 MED ORDER — DESOXIMETASONE 0.25 % EX CREA: 1.0000 "application " | TOPICAL_CREAM | Freq: Two times a day (BID) | CUTANEOUS | 0 refills | Status: DC

## 2016-06-25 NOTE — Progress Notes (Signed)
Subjective: Holly French is a 41 y.o. is seen today in office s/p right EPF and gastroc recession preformed on 01/14/16. She states that overall she does that she has made great improvement since the surgery. Discomfort she is having now is mostly just at the end of the day after being on her feet all day at work. The pain that she's having prior surgeries on this resolved. She still gets some numbness around the incision to her calf but this has been slowly improving. She has noticed a rash on the top of her foot which she believes is a heat rash from wearing the compression sock in the ankle brace. She's been using over-the-counter hydrocortisone cream without any significant improvement. Denies any increase in swelling or redness of the foot. She only wears an ankle brace and she does a lot of work her works double shift. Denies any systemic complaints such as fevers, chills, nausea, vomiting. No calf pain, chest pain, shortness of breath.   Objective: General: No acute distress, AAOx3  DP/PT pulses palpable 2/4, CRT < 3 sec to all digits.  Protective sensation intact. Motor function intact. The numbness to the top of this and was resolved. The majority of some numbness and tingling is on the incision. She states that she still has sensation just feels tingly at times. Right foot/leg: Incision is well coapted without any evidence of dehiscence and scars have formed. There is no significant tenderness to palpation to the heel.  There is no pain to the gastrocnemius recession. She's had some mild recurrence of equinus however does still continue to be improved compared to what it was prior to surgery. There are no other areas of tenderness to bilateral lower extremities. Overall, she states she has made good improvement.  There is a small rash of the foot with small bumps present. This does correlate to the outline of the compression sock. This does appear to be more like a heat rash. It does itch. No  pain with calf compression, warmth, erythema.   Assessment and Plan:  Status post right foot EPF and gastroc recession  -Treatment options discussed including all alternatives, risks, and complications -For the rash is tried topical hydrocortisone the a significant improvement. Order Topicort to use the next 2 weeks. If not resolving to resolve the office. -At this time from her surgery she is doing well. I recommended continuing stretching, icing. Continue the night splint as well as multiple supportive shoe gear and orthotics. I will that her symptoms will continue to improve but she should continue on this on a regular basis. I will discharge her from postoperative care bankers recall any questions or concerns or any change in symptoms and she agrees to this plan and understands.  Ovid CurdMatthew Tersa Fotopoulos, DPM

## 2016-11-01 ENCOUNTER — Ambulatory Visit: Payer: Medicaid Other | Admitting: Unknown Physician Specialty

## 2016-11-02 ENCOUNTER — Ambulatory Visit (INDEPENDENT_AMBULATORY_CARE_PROVIDER_SITE_OTHER): Payer: Self-pay | Admitting: Family Medicine

## 2016-11-02 ENCOUNTER — Encounter: Payer: Self-pay | Admitting: Family Medicine

## 2016-11-02 VITALS — BP 120/78 | HR 98 | Temp 98.3°F | Ht 62.0 in | Wt 217.8 lb

## 2016-11-02 DIAGNOSIS — T7840XA Allergy, unspecified, initial encounter: Secondary | ICD-10-CM

## 2016-11-02 MED ORDER — TRIAMCINOLONE ACETONIDE 40 MG/ML IJ SUSP
80.0000 mg | Freq: Once | INTRAMUSCULAR | Status: AC
  Administered 2016-11-02: 80 mg via INTRAMUSCULAR

## 2016-11-02 MED ORDER — RANITIDINE HCL 150 MG PO TABS: 150.0000 mg | ORAL_TABLET | Freq: Two times a day (BID) | ORAL | 0 refills | Status: DC

## 2016-11-02 MED ORDER — PREDNISONE 50 MG PO TABS: 50.0000 mg | ORAL_TABLET | Freq: Every day | ORAL | 0 refills | Status: DC

## 2016-11-02 NOTE — Progress Notes (Signed)
BP 120/78 (BP Location: Left Arm, Patient Position: Sitting, Cuff Size: Large)   Pulse 98   Temp 98.3 F (36.8 C)   Ht '5\' 2"'  (1.575 m)   Wt 217 lb 12.8 oz (98.8 kg)   LMP 02/25/2009 (Approximate)   SpO2 98%   BMI 39.84 kg/m    Subjective:    Patient ID: Holly French, female    DOB: [birth year], 41 y.o.   MRN: [id number]  HPI: Holly French is a 41 y.o. female  Chief Complaint  Patient presents with  . Rash    Patient got MMR at [employer] at work. Patient states she has an allergy to latex. They explained latex was in the vial. Gave patient MMR explained to patient if she had any shortness of breath or trouble breathing, go to the ER. Patient has had a rash ever since. 10/04/2016   RASH- known latex allergy, given shot with a vial that contained latex at employee health. Has had rash ever since.  Duration:  weeks  Location: generalized  Itching: yes Burning: yes Redness: yes Oozing: yes Scaling: yes Blisters: no Painful: yes Fevers: no Change in detergents/soaps/personal care products: no Recent illness: no Recent travel:no History of same: no Context: stable Alleviating factors: nothing Treatments attempted:hydrocortisone cream, benadryl and lotion/moisturizer Shortness of breath: no  Throat/tongue swelling: no Myalgias/arthralgias: no  Relevant past medical, surgical, family and social history reviewed and updated as indicated. Interim medical history since our last visit reviewed. Allergies and medications reviewed and updated.  Review of Systems  Constitutional: Negative.   Respiratory: Negative.   Cardiovascular: Negative.   Skin: Positive for rash. Negative for color change, pallor and wound.  Psychiatric/Behavioral: Negative.     Per HPI unless specifically indicated above     Objective:    BP 120/78 (BP Location: Left Arm, Patient Position: Sitting, Cuff Size: Large)   Pulse 98   Temp 98.3 F (36.8 C)   Ht '5\' 2"'  (1.575 m)   Wt  217 lb 12.8 oz (98.8 kg)   LMP 02/25/2009 (Approximate)   SpO2 98%   BMI 39.84 kg/m   Wt Readings from Last 3 Encounters:  11/02/16 217 lb 12.8 oz (98.8 kg)  05/04/16 217 lb (98.4 kg)  11/17/15 213 lb (96.6 kg)    Physical Exam  Constitutional: She is oriented to person, place, and time. She appears well-developed and well-nourished. No distress.  HENT:  Head: Normocephalic and atraumatic.  Right Ear: Hearing normal.  Left Ear: Hearing normal.  Nose: Nose normal.  Eyes: Conjunctivae and lids are normal. Right eye exhibits no discharge. Left eye exhibits no discharge. No scleral icterus.  Cardiovascular: Normal rate, regular rhythm, normal heart sounds and intact distal pulses.  Exam reveals no gallop and no friction rub.   No murmur heard. Pulmonary/Chest: Effort normal and breath sounds normal. No respiratory distress. She has no wheezes. She has no rales. She exhibits no tenderness.  Musculoskeletal: Normal range of motion.  Neurological: She is alert and oriented to person, place, and time.  Skin: Skin is warm, dry and intact. Rash (generalized excoriated pustular rash ) noted. No erythema. No pallor.  Psychiatric: She has a normal mood and affect. Her speech is normal and behavior is normal. Judgment and thought content normal. Cognition and memory are normal.  Nursing note and vitals reviewed.   Results for orders placed or performed in visit on 05/04/16  Comprehensive metabolic panel  Result Value Ref Range   Glucose  75 65 - 99 mg/dL   BUN 8 6 - 20 mg/dL   Creatinine, Ser 0.70 0.57 - 1.00 mg/dL   GFR calc non Af Amer 110 >59 mL/min/1.73   GFR calc Af Amer 127 >59 mL/min/1.73   BUN/Creatinine Ratio 11 9 - 23   Sodium 137 134 - 144 mmol/L   Potassium 4.5 3.5 - 5.2 mmol/L   Chloride 97 96 - 106 mmol/L   CO2 24 18 - 29 mmol/L   Calcium 9.8 8.7 - 10.2 mg/dL   Total Protein 6.9 6.0 - 8.5 g/dL   Albumin 4.5 3.5 - 5.5 g/dL   Globulin, Total 2.4 1.5 - 4.5 g/dL    Albumin/Globulin Ratio 1.9 1.2 - 2.2   Bilirubin Total 0.2 0.0 - 1.2 mg/dL   Alkaline Phosphatase 79 39 - 117 IU/L   AST 17 0 - 40 IU/L   ALT 19 0 - 32 IU/L  Lipid Panel w/o Chol/HDL Ratio  Result Value Ref Range   Cholesterol, Total 215 (H) 100 - 199 mg/dL   Triglycerides 177 (H) 0 - 149 mg/dL   HDL 57 >39 mg/dL   VLDL Cholesterol Cal 35 5 - 40 mg/dL   LDL Calculated 123 (H) 0 - 99 mg/dL  TSH  Result Value Ref Range   TSH 1.510 0.450 - 4.500 uIU/mL      Assessment & Plan:   Problem List Items Addressed This Visit    None    Visit Diagnoses    Allergic reaction to drug, initial encounter    -  Primary   Toradol shot given today. Will start prednisone, antihistamine and zantac for H2 blocker. Call if not getting better or getting worse.    Relevant Medications   triamcinolone acetonide (KENALOG-40) injection 80 mg (Start on 11/02/2016  2:00 PM)       Follow up plan: Return As scheduled.

## 2016-11-03 ENCOUNTER — Telehealth: Payer: Self-pay | Admitting: Family Medicine

## 2016-11-03 NOTE — Telephone Encounter (Signed)
Pt seen yesterday,  Per a&P from that OV,   " Allergic reaction to drug, initial encounter    -  Primary    Toradol shot given today. Will start prednisone, antihistamine and zantac for H2 blocker. Call if not getting better or getting worse.    Relevant Medications   triamcinolone acetonide (KENALOG-40) injection 80 mg (Start on 11/02/2016  2:00 PM)  "  Please advise.

## 2016-11-03 NOTE — Telephone Encounter (Signed)
Pt would like to know if she could have a cream sent to [facility] [address] [place] for her arms.

## 2016-11-04 NOTE — Telephone Encounter (Signed)
The prednisone should do it without a cream, if she's not getting better, let me know and I'll send her through a cream.

## 2016-11-04 NOTE — Telephone Encounter (Signed)
Left message on machine for pt to return call to the office.  

## 2016-11-10 ENCOUNTER — Telehealth: Payer: Self-pay | Admitting: Family Medicine

## 2016-11-10 DIAGNOSIS — R21 Rash and other nonspecific skin eruption: Secondary | ICD-10-CM

## 2016-11-10 NOTE — Telephone Encounter (Signed)
Patient notified

## 2016-11-10 NOTE — Telephone Encounter (Signed)
Spoke with patient, she states that the rash was better while on the prednisone, now that she has finished the prednisone it is starting to raise back up.  [facility] [place]

## 2016-11-10 NOTE — Telephone Encounter (Signed)
The latex should be out of her system by now. I'd like her to see dermatology. Referral generated today.

## 2016-11-12 NOTE — Telephone Encounter (Signed)
Spoke with patient gave her the information regarding the appt scheduled for dermatology.

## 2016-11-12 NOTE — Telephone Encounter (Signed)
I tried calling patient to notify her of her Dermatology appointment.  11/26/2016 at 10:30 AM with Dr[facility]Stewart [facility].

## 2016-11-12 NOTE — Telephone Encounter (Signed)
Patient states she is returning a call from someone here she thought it was [place] but it may have been [place].  This is regarding an earlier call from the patient  Thanks  [phone]

## 2016-11-15 ENCOUNTER — Ambulatory Visit (INDEPENDENT_AMBULATORY_CARE_PROVIDER_SITE_OTHER): Payer: Self-pay | Admitting: Unknown Physician Specialty

## 2016-11-15 ENCOUNTER — Encounter: Payer: Self-pay | Admitting: Unknown Physician Specialty

## 2016-11-15 VITALS — BP 139/89 | HR 102 | Temp 98.2°F | Wt 216.6 lb

## 2016-11-15 DIAGNOSIS — L509 Urticaria, unspecified: Secondary | ICD-10-CM

## 2016-11-15 DIAGNOSIS — Z566 Other physical and mental strain related to work: Secondary | ICD-10-CM

## 2016-11-15 MED ORDER — PREDNISONE 10 MG PO TABS: ORAL_TABLET | ORAL | 0 refills | Status: DC

## 2016-11-15 NOTE — Progress Notes (Signed)
BP 139/89   Pulse (!) 102   Temp 98.2 F (36.8 C)   Wt 216 lb 9.6 oz (98.2 kg)   LMP 02/25/2009 (Approximate)   SpO2 98%   BMI 39.62 kg/m    Subjective:    Patient ID: Holly French, female    DOB: Jun 05, 1976, 41 y.o.   MRN: [id number]  HPI: Holly French is a 41 y.o. female  Chief Complaint  Patient presents with  . Hypertension  . Depression  . Rash    pt states she was seen a couple weeks ago for a rash, given Prednisone, and when she finished the prednisone, the rash has come back. Pt states the rash is all over her body.    Hypertension Using medications without difficulty Average home BPs not checking  No problems or lightheadedness No chest pain with exertion or shortness of breath No Edema  Rash Pt with rash determined to be an allergic reaction.  Reviewed Dr. Durenda Age note.  Completed prednisone but rash returned immediately but worse.  Taking Zyrtec, Zantac and Benadryl.   Nobody else in the family with a rash.  Rash started after starting her new job and getting a MMR.  Appt set up with dermatology.  Taking Duloxetine  Depression States she is under a lot of stress with starting a new job.  Daughter is out of school, getting aggravated with mother Depression screen [facility] 2/9 11/15/2016 05/04/2016 05/02/2015  Decreased Interest 1 0 0  Down, Depressed, Hopeless 0 1 0  PHQ - 2 Score 1 1 0  Altered sleeping 2 2 -  Tired, decreased energy 2 2 -  Change in appetite 2 1 -  Feeling bad or failure about yourself  1 1 -  Trouble concentrating 0 0 -  Moving slowly or fidgety/restless 0 0 -  Suicidal thoughts 0 0 -  PHQ-9 Score 8 7 -    Relevant past medical, surgical, family and social history reviewed and updated as indicated. Interim medical history since our last visit reviewed. Allergies and medications reviewed and updated.  Review of Systems  Per HPI unless specifically indicated above     Objective:    BP 139/89   Pulse (!) 102   Temp 98.2 F  (36.8 C)   Wt 216 lb 9.6 oz (98.2 kg)   LMP 02/25/2009 (Approximate)   SpO2 98%   BMI 39.62 kg/m   Wt Readings from Last 3 Encounters:  11/15/16 216 lb 9.6 oz (98.2 kg)  11/02/16 217 lb 12.8 oz (98.8 kg)  05/04/16 217 lb (98.4 kg)    Physical Exam  Constitutional: She is oriented to person, place, and time. She appears well-developed and well-nourished. No distress.  HENT:  Head: Normocephalic and atraumatic.  Eyes: Conjunctivae and lids are normal. Right eye exhibits no discharge. Left eye exhibits no discharge. No scleral icterus.  Neck: Normal range of motion. Neck supple. No JVD present. Carotid bruit is not present.  Cardiovascular: Normal rate, regular rhythm and normal heart sounds.   Pulmonary/Chest: Effort normal and breath sounds normal.  Abdominal: Normal appearance. There is no splenomegaly or hepatomegaly.  Musculoskeletal: Normal range of motion.  Neurological: She is alert and oriented to person, place, and time.  Skin: Skin is warm, dry and intact. Rash noted. Rash is maculopapular and urticarial.  All over body and most prominenent on flexor surfaces  Psychiatric: She has a normal mood and affect. Her behavior is normal. Judgment and thought content normal.  Results for orders placed or performed in visit on 05/04/16  Comprehensive metabolic panel  Result Value Ref Range   Glucose 75 65 - 99 mg/dL   BUN 8 6 - 20 mg/dL   Creatinine, Ser 0.70 0.57 - 1.00 mg/dL   GFR calc non Af Amer 110 >59 mL/min/1.73   GFR calc Af Amer 127 >59 mL/min/1.73   BUN/Creatinine Ratio 11 9 - 23   Sodium 137 134 - 144 mmol/L   Potassium 4.5 3.5 - 5.2 mmol/L   Chloride 97 96 - 106 mmol/L   CO2 24 18 - 29 mmol/L   Calcium 9.8 8.7 - 10.2 mg/dL   Total Protein 6.9 6.0 - 8.5 g/dL   Albumin 4.5 3.5 - 5.5 g/dL   Globulin, Total 2.4 1.5 - 4.5 g/dL   Albumin/Globulin Ratio 1.9 1.2 - 2.2   Bilirubin Total 0.2 0.0 - 1.2 mg/dL   Alkaline Phosphatase 79 39 - 117 IU/L   AST 17 0 - 40 IU/L    ALT 19 0 - 32 IU/L  Lipid Panel w/o Chol/HDL Ratio  Result Value Ref Range   Cholesterol, Total 215 (H) 100 - 199 mg/dL   Triglycerides 177 (H) 0 - 149 mg/dL   HDL 57 >39 mg/dL   VLDL Cholesterol Cal 35 5 - 40 mg/dL   LDL Calculated 123 (H) 0 - 99 mg/dL  TSH  Result Value Ref Range   TSH 1.510 0.450 - 4.500 uIU/mL  +    Assessment & Plan:   Problem List Items Addressed This Visit    None    Visit Diagnoses    Urticaria    -  Primary   Seeing dermatology soon.  Rx for predisone taper.  Normally would start on Doxepin but taking Amitriptyline.  Suspect related to stress.    Stress at work       Unable to prescribe Benzo with Hydrocodone.  Will get dermatology consult.  Contact [facility] for stress reactions.       Worsening urticaria and stress.  Prednisone taper should get her through until dermatology referral.  Already on multiple anti-histamines: Singulair, Zyrtec, Zantac, Benadryl plus Amitrytiline.     Follow up plan: Return if symptoms worsen or fail to improve.

## 2016-11-15 NOTE — Telephone Encounter (Signed)
Patient came in for OV today and stated that she has a dermatology appointment on 11/26/16.

## 2016-11-28 ENCOUNTER — Other Ambulatory Visit: Payer: Self-pay | Admitting: Family Medicine

## 2016-11-29 NOTE — Telephone Encounter (Signed)
Your patient 

## 2016-12-12 ENCOUNTER — Other Ambulatory Visit: Payer: Self-pay | Admitting: Family Medicine

## 2016-12-30 ENCOUNTER — Other Ambulatory Visit: Payer: Self-pay | Admitting: Unknown Physician Specialty

## 2017-02-08 ENCOUNTER — Other Ambulatory Visit: Payer: Self-pay | Admitting: Unknown Physician Specialty

## 2017-03-08 ENCOUNTER — Other Ambulatory Visit: Payer: Self-pay | Admitting: Unknown Physician Specialty

## 2017-06-10 ENCOUNTER — Other Ambulatory Visit: Payer: Self-pay | Admitting: Family Medicine

## 2017-10-12 ENCOUNTER — Encounter: Payer: Self-pay | Admitting: Unknown Physician Specialty

## 2017-10-12 ENCOUNTER — Ambulatory Visit (INDEPENDENT_AMBULATORY_CARE_PROVIDER_SITE_OTHER): Payer: PRIVATE HEALTH INSURANCE | Admitting: Unknown Physician Specialty

## 2017-10-12 VITALS — BP 128/86 | HR 103 | Temp 98.0°F | Ht 62.2 in | Wt 228.4 lb

## 2017-10-12 DIAGNOSIS — M25562 Pain in left knee: Secondary | ICD-10-CM

## 2017-10-12 DIAGNOSIS — Z0000 Encounter for general adult medical examination without abnormal findings: Secondary | ICD-10-CM | POA: Diagnosis not present

## 2017-10-12 NOTE — Progress Notes (Signed)
BP 128/86   Pulse (!) 103   Temp 98 F (36.7 C) (Oral)   Ht 5' 2.2" (1.58 m)   Wt 228 lb 6.4 oz (103.6 kg)   LMP 02/25/2009 (Approximate)   SpO2 98%   BMI 41.51 kg/m    Subjective:    Patient ID: Holly French, female    DOB: 1975-10-01, 42 y.o.   MRN: [id number]  HPI: Holly French is a 42 y.o. female  Chief Complaint  Patient presents with  . Annual Exam  . Knee Pain    left knee, week and a half   Back pain Under the care of a pain specialist.  Takes Hydrocodone TID plus Cymbalta, Tizanidine, Duloxetine, and Amitriptyline.   Takes Lunesta prn.    Depression screen [facility] 2/9 11/15/2016 05/04/2016 05/02/2015  Decreased Interest 1 0 0  Down, Depressed, Hopeless 0 1 0  PHQ - 2 Score 1 1 0  Altered sleeping 2 2 -  Tired, decreased energy 2 2 -  Change in appetite 2 1 -  Feeling bad or failure about yourself  1 1 -  Trouble concentrating 0 0 -  Moving slowly or fidgety/restless 0 0 -  Suicidal thoughts 0 0 -  PHQ-9 Score 8 7 -   Knee Pain   Incident onset: 1 1/2 weeks ago. Incident location: Puppy and her were going different directions and heard a pop. The pain is present in the left knee. The quality of the pain is described as aching (throbbing and tight). The pain is severe. The pain has been constant since onset. Pertinent negatives include no inability to bear weight, loss of motion, loss of sensation, muscle weakness, numbness or tingling. Exacerbated by: sitting or walking. She has tried ice (Taking Advil) for the symptoms. The treatment provided mild relief.    Relevant past medical, surgical, family and social history reviewed and updated as indicated. Interim medical history since our last visit reviewed. Allergies and medications reviewed and updated.  Review of Systems  Constitutional: Negative.   HENT: Negative.   Eyes: Negative.   Respiratory: Negative.   Cardiovascular: Negative.   Gastrointestinal: Negative.   Endocrine: Negative.     Genitourinary: Negative.   Musculoskeletal: Negative.   Skin: Negative.   Allergic/Immunologic: Negative.   Neurological: Negative.  Negative for tingling and numbness.  Hematological: Negative.   Psychiatric/Behavioral: Negative.     Per HPI unless specifically indicated above     Objective:    BP 128/86   Pulse (!) 103   Temp 98 F (36.7 C) (Oral)   Ht 5' 2.2" (1.58 m)   Wt 228 lb 6.4 oz (103.6 kg)   LMP 02/25/2009 (Approximate)   SpO2 98%   BMI 41.51 kg/m   Wt Readings from Last 3 Encounters:  10/12/17 228 lb 6.4 oz (103.6 kg)  11/15/16 216 lb 9.6 oz (98.2 kg)  11/02/16 217 lb 12.8 oz (98.8 kg)    Physical Exam  Constitutional: She is oriented to person, place, and time. She appears well-developed and well-nourished.  HENT:  Head: Normocephalic and atraumatic.  Eyes: Pupils are equal, round, and reactive to light. Right eye exhibits no discharge. Left eye exhibits no discharge. No scleral icterus.  Neck: Normal range of motion. Neck supple. Carotid bruit is not present. No thyromegaly present.  Cardiovascular: Normal rate, regular rhythm and normal heart sounds. Exam reveals no gallop and no friction rub.  No murmur heard. Pulmonary/Chest: Effort normal and breath sounds normal. No respiratory  distress. She has no wheezes. She has no rales. No breast tenderness or discharge.  Abdominal: Soft. Bowel sounds are normal. There is no tenderness. There is no rebound.  Genitourinary: No breast tenderness or discharge.  Musculoskeletal:       Left knee: She exhibits decreased range of motion and swelling.  Lymphadenopathy:    She has no cervical adenopathy.  Neurological: She is alert and oriented to person, place, and time.  Skin: Skin is warm, dry and intact. No rash noted.  Psychiatric: She has a normal mood and affect. Her speech is normal and behavior is normal. Judgment and thought content normal. Cognition and memory are normal.   Unable to do pap today due to  severe knee pain  Results for orders placed or performed in visit on 05/04/16  Comprehensive metabolic panel  Result Value Ref Range   Glucose 75 65 - 99 mg/dL   BUN 8 6 - 20 mg/dL   Creatinine, Ser 1.61 0.57 - 1.00 mg/dL   GFR calc non Af Amer 110 >59 mL/min/1.73   GFR calc Af Amer 127 >59 mL/min/1.73   BUN/Creatinine Ratio 11 9 - 23   Sodium 137 134 - 144 mmol/L   Potassium 4.5 3.5 - 5.2 mmol/L   Chloride 97 96 - 106 mmol/L   CO2 24 18 - 29 mmol/L   Calcium 9.8 8.7 - 10.2 mg/dL   Total Protein 6.9 6.0 - 8.5 g/dL   Albumin 4.5 3.5 - 5.5 g/dL   Globulin, Total 2.4 1.5 - 4.5 g/dL   Albumin/Globulin Ratio 1.9 1.2 - 2.2   Bilirubin Total 0.2 0.0 - 1.2 mg/dL   Alkaline Phosphatase 79 39 - 117 IU/L   AST 17 0 - 40 IU/L   ALT 19 0 - 32 IU/L  Lipid Panel w/o Chol/HDL Ratio  Result Value Ref Range   Cholesterol, Total 215 (H) 100 - 199 mg/dL   Triglycerides 096 (H) 0 - 149 mg/dL   HDL 57 >04 mg/dL   VLDL Cholesterol Cal 35 5 - 40 mg/dL   LDL Calculated 540 (H) 0 - 99 mg/dL  TSH  Result Value Ref Range   TSH 1.510 0.450 - 4.500 uIU/mL      Assessment & Plan:   Problem List Items Addressed This Visit    None    Visit Diagnoses    Acute pain of left knee    -  Primary   Pain and swelling making it unable to assess.  Will refer to Orthopedics.     Relevant Orders   Ambulatory referral to Orthopedic Surgery   Annual physical exam       Relevant Orders   CBC with Differential/Platelet   Comprehensive metabolic panel   Lipid Panel w/o Chol/HDL Ratio   TSH      HM Pap due but unable due to pain Refusing HIV screening Td due 2020   Follow up plan: Return in about 1 month (around 11/09/2017) for repeat pap.

## 2017-10-13 ENCOUNTER — Encounter: Payer: Self-pay | Admitting: Unknown Physician Specialty

## 2017-10-13 LAB — CBC WITH DIFFERENTIAL/PLATELET
Basophils Absolute: 0 10*3/uL (ref 0.0–0.2)
Basos: 1 %
EOS (ABSOLUTE): 0.5 10*3/uL — ABNORMAL HIGH (ref 0.0–0.4)
EOS: 6 %
HEMATOCRIT: 37.9 % (ref 34.0–46.6)
HEMOGLOBIN: 12.7 g/dL (ref 11.1–15.9)
IMMATURE GRANS (ABS): 0.1 10*3/uL (ref 0.0–0.1)
IMMATURE GRANULOCYTES: 1 %
LYMPHS: 29 %
Lymphocytes Absolute: 2.4 10*3/uL (ref 0.7–3.1)
MCH: 31.4 pg (ref 26.6–33.0)
MCHC: 33.5 g/dL (ref 31.5–35.7)
MCV: 94 fL (ref 79–97)
MONOCYTES: 6 %
Monocytes Absolute: 0.5 10*3/uL (ref 0.1–0.9)
Neutrophils Absolute: 4.8 10*3/uL (ref 1.4–7.0)
Neutrophils: 57 %
Platelets: 346 10*3/uL (ref 150–379)
RBC: 4.05 x10E6/uL (ref 3.77–5.28)
RDW: 13.7 % (ref 12.3–15.4)
WBC: 8.3 10*3/uL (ref 3.4–10.8)

## 2017-10-13 LAB — COMPREHENSIVE METABOLIC PANEL
ALBUMIN: 4.3 g/dL (ref 3.5–5.5)
ALT: 16 IU/L (ref 0–32)
AST: 17 IU/L (ref 0–40)
Albumin/Globulin Ratio: 1.7 (ref 1.2–2.2)
Alkaline Phosphatase: 84 IU/L (ref 39–117)
BUN / CREAT RATIO: 16 (ref 9–23)
BUN: 12 mg/dL (ref 6–20)
Bilirubin Total: <0.2 mg/dL (ref 0.0–1.2)
CALCIUM: 9 mg/dL (ref 8.7–10.2)
CO2: 20 mmol/L (ref 20–29)
Chloride: 100 mmol/L (ref 96–106)
Creatinine, Ser: 0.74 mg/dL (ref 0.57–1.00)
GFR, EST AFRICAN AMERICAN: 118 mL/min/{1.73_m2} (ref >59)
GFR, EST NON AFRICAN AMERICAN: 102 mL/min/{1.73_m2} (ref >59)
GLOBULIN, TOTAL: 2.5 g/dL (ref 1.5–4.5)
Glucose: 54 mg/dL — ABNORMAL LOW (ref 65–99)
Potassium: 4.1 mmol/L (ref 3.5–5.2)
SODIUM: 139 mmol/L (ref 134–144)
TOTAL PROTEIN: 6.8 g/dL (ref 6.0–8.5)

## 2017-10-13 LAB — LIPID PANEL W/O CHOL/HDL RATIO
Cholesterol, Total: 204 mg/dL — ABNORMAL HIGH (ref 100–199)
HDL: 46 mg/dL (ref >39)
LDL CALC: 128 mg/dL — AB (ref 0–99)
Triglycerides: 149 mg/dL (ref 0–149)
VLDL CHOLESTEROL CAL: 30 mg/dL (ref 5–40)

## 2017-10-13 LAB — TSH: TSH: 1.54 u[IU]/mL (ref 0.450–4.500)

## 2017-10-13 NOTE — Progress Notes (Signed)
Notified pt by mychart

## 2017-11-06 ENCOUNTER — Other Ambulatory Visit: Payer: Self-pay | Admitting: Unknown Physician Specialty

## 2017-11-09 ENCOUNTER — Telehealth: Payer: Self-pay | Admitting: Unknown Physician Specialty

## 2017-11-09 ENCOUNTER — Ambulatory Visit: Payer: Medicaid Other | Admitting: Unknown Physician Specialty

## 2017-11-09 NOTE — Telephone Encounter (Signed)
Called and spoke to patient. I let her know what Holly French said. Patient verbalized understanding.

## 2017-11-09 NOTE — Telephone Encounter (Signed)
Tell her I'm sorry.  But I cannot prescribe a controlled substance without breaking her pain contract.  However, they can possibly prescribe her something

## 2017-11-09 NOTE — Telephone Encounter (Signed)
Copied from CRM [id number]. Topic: Inquiry >> Nov 09, 2017  9:31 AM Jolayne Haines L wrote: Reason for CRM: Patient called to cancel her 10:45 appointment due to her mother passing away this morning.  >> Nov 09, 2017  9:48 AM Sharol Given wrote: Sherron Monday with pt and she stated that she was trying to process everything and wanted to know if she could have some medication to help get her through this trying time. She stated that she goes to [facility] for pain management. Pharmacy is [facility].

## 2017-11-15 ENCOUNTER — Ambulatory Visit (INDEPENDENT_AMBULATORY_CARE_PROVIDER_SITE_OTHER): Payer: PRIVATE HEALTH INSURANCE | Admitting: Unknown Physician Specialty

## 2017-11-15 ENCOUNTER — Encounter: Payer: Self-pay | Admitting: Unknown Physician Specialty

## 2017-11-15 VITALS — BP 122/82 | HR 103 | Temp 98.1°F | Ht 62.2 in | Wt 224.4 lb

## 2017-11-15 DIAGNOSIS — Z887 Allergy status to serum and vaccine status: Secondary | ICD-10-CM

## 2017-11-15 DIAGNOSIS — Z23 Encounter for immunization: Secondary | ICD-10-CM

## 2017-11-15 DIAGNOSIS — Z124 Encounter for screening for malignant neoplasm of cervix: Secondary | ICD-10-CM

## 2017-11-15 NOTE — Progress Notes (Signed)
BP 122/82   Pulse (!) 103   Temp 98.1 F (36.7 C) (Oral)   Ht 5' 2.2" (1.58 m)   Wt 224 lb 6.4 oz (101.8 kg)   LMP 02/25/2009 (Approximate)   SpO2 97%   BMI 40.78 kg/m    Subjective:    Patient ID: Holly French, female    DOB: 1976/02/10, 42 y.o.   MRN: [id number]  HPI: Holly French is a 42 y.o. female  No chief complaint on file.  Pt is here for her 3 year pap.  No complaints.    Pt needs MMR titer according to her job but had one last year and had an extensive rash requiring a shot of prednisone.    Relevant past medical, surgical, family and social history reviewed and updated as indicated. Interim medical history since our last visit reviewed. Allergies and medications reviewed and updated.  Review of Systems  Per HPI unless specifically indicated above     Objective:    BP 122/82   Pulse (!) 103   Temp 98.1 F (36.7 C) (Oral)   Ht 5' 2.2" (1.58 m)   Wt 224 lb 6.4 oz (101.8 kg)   LMP 02/25/2009 (Approximate)   SpO2 97%   BMI 40.78 kg/m   Wt Readings from Last 3 Encounters:  11/15/17 224 lb 6.4 oz (101.8 kg)  10/12/17 228 lb 6.4 oz (103.6 kg)  11/15/16 216 lb 9.6 oz (98.2 kg)    Physical Exam  Constitutional: She is oriented to person, place, and time. She appears well-developed and well-nourished. No distress.  HENT:  Head: Normocephalic and atraumatic.  Eyes: Conjunctivae and lids are normal. Right eye exhibits no discharge. Left eye exhibits no discharge. No scleral icterus.  Cardiovascular: Normal rate.  Pulmonary/Chest: Effort normal.  Abdominal: Normal appearance. There is no splenomegaly or hepatomegaly.  Genitourinary: Vagina normal and uterus normal. Pelvic exam was performed with patient prone. There is no rash or tenderness on the right labia. There is no rash or tenderness on the left labia. Cervix exhibits no motion tenderness, no discharge and no friability. Right adnexum displays no mass. Left adnexum displays no mass.    Musculoskeletal: Normal range of motion.  Neurological: She is alert and oriented to person, place, and time.  Skin: Skin is intact. No rash noted. No pallor.  Psychiatric: She has a normal mood and affect. Her behavior is normal. Judgment and thought content normal.    Results for orders placed or performed in visit on 10/12/17  CBC with Differential/Platelet  Result Value Ref Range   WBC 8.3 3.4 - 10.8 x10E3/uL   RBC 4.05 3.77 - 5.28 x10E6/uL   Hemoglobin 12.7 11.1 - 15.9 g/dL   Hematocrit 37.9 34.0 - 46.6 %   MCV 94 79 - 97 fL   MCH 31.4 26.6 - 33.0 pg   MCHC 33.5 31.5 - 35.7 g/dL   RDW 13.7 12.3 - 15.4 %   Platelets 346 150 - 379 x10E3/uL   Neutrophils 57 Not Estab. %   Lymphs 29 Not Estab. %   Monocytes 6 Not Estab. %   Eos 6 Not Estab. %   Basos 1 Not Estab. %   Neutrophils Absolute 4.8 1.4 - 7.0 x10E3/uL   Lymphocytes Absolute 2.4 0.7 - 3.1 x10E3/uL   Monocytes Absolute 0.5 0.1 - 0.9 x10E3/uL   EOS (ABSOLUTE) 0.5 (H) 0.0 - 0.4 x10E3/uL   Basophils Absolute 0.0 0.0 - 0.2 x10E3/uL   Immature Granulocytes 1 Not  Estab. %   Immature Grans (Abs) 0.1 0.0 - 0.1 x10E3/uL  Comprehensive metabolic panel  Result Value Ref Range   Glucose 54 (L) 65 - 99 mg/dL   BUN 12 6 - 20 mg/dL   Creatinine, Ser 0.74 0.57 - 1.00 mg/dL   GFR calc non Af Amer 102 >59 mL/min/1.73   GFR calc Af Amer 118 >59 mL/min/1.73   BUN/Creatinine Ratio 16 9 - 23   Sodium 139 134 - 144 mmol/L   Potassium 4.1 3.5 - 5.2 mmol/L   Chloride 100 96 - 106 mmol/L   CO2 20 20 - 29 mmol/L   Calcium 9.0 8.7 - 10.2 mg/dL   Total Protein 6.8 6.0 - 8.5 g/dL   Albumin 4.3 3.5 - 5.5 g/dL   Globulin, Total 2.5 1.5 - 4.5 g/dL   Albumin/Globulin Ratio 1.7 1.2 - 2.2   Bilirubin Total <0.2 0.0 - 1.2 mg/dL   Alkaline Phosphatase 84 39 - 117 IU/L   AST 17 0 - 40 IU/L   ALT 16 0 - 32 IU/L  Lipid Panel w/o Chol/HDL Ratio  Result Value Ref Range   Cholesterol, Total 204 (H) 100 - 199 mg/dL   Triglycerides 149 0 - 149  mg/dL   HDL 46 >39 mg/dL   VLDL Cholesterol Cal 30 5 - 40 mg/dL   LDL Calculated 128 (H) 0 - 99 mg/dL  TSH  Result Value Ref Range   TSH 1.540 0.450 - 4.500 uIU/mL      Assessment & Plan:   Problem List Items Addressed This Visit      Unprioritized   Cervical cancer screening   Relevant Orders   IGP, Aptima HPV, rfx 16/18,45    Other Visit Diagnoses    Need for MMR vaccine    -  Primary   Relevant Orders   Measles/Mumps/Rubella Immunity   Hx of vaccine allergy       Relevant Orders   Measles/Mumps/Rubella Immunity       Follow up plan: Return if symptoms worsen or fail to improve.

## 2017-11-15 NOTE — Patient Instructions (Signed)
MMR (Measles, Mumps and Rubella) Vaccine: What You Need to Know 1. Why get vaccinated? Measles, mumps, and rubella are viral diseases that can have serious consequences. Before vaccines, these diseases were very common in the [place], especially among children. They are still common in many parts of the world. Measles  Measles virus causes symptoms that can include fever, cough, runny nose, and red, watery eyes, commonly followed by a rash that covers the whole body.  Measles can lead to ear infections, diarrhea, and infection of the lungs (pneumonia). Rarely, measles can cause brain damage or death. Mumps  Mumps virus causes fever, headache, muscle aches, tiredness, loss of appetite, and swollen and tender salivary glands under the ears on one or both sides.  Mumps can lead to deafness, swelling of the brain and/or spinal cord covering (encephalitis or meningitis), painful swelling of the testicles or ovaries, and, very rarely, death. Rubella (also known as [place] Measles)  Rubella virus causes fever, sore throat, rash, headache, and eye irritation.  Rubella can cause arthritis in up to half of teenage and adult women.  If a woman gets rubella while she is pregnant, she could have a miscarriage or her baby could be born with serious birth defects. These diseases can easily spread from person to person. Measles doesn't even require personal contact. You can get measles by entering a room that a person with measles left up to 2 hours before. Vaccines and high rates of vaccination have made these diseases much less common in the [place]. 2. MMR vaccine Children should get 2 doses of MMR vaccine, usually:  First dose: 12 through 44 months of age  Second dose: 74 through 40 years of age  71 who will be traveling outside the [place] when they are between 41 and 60 months of age should get a dose of MMR vaccine before travel. This can provide temporary protection from  measles infection, but will not give permanent immunity. The child should still get 2 doses at the recommended ages for long-lasting protection. Adults might also need MMR vaccine. Many adults 60 years of age and older might be susceptible to measles, mumps, and rubella without knowing it. A third dose of MMR might be recommended in certain mumps outbreak situations. There are no known risks to getting MMR vaccine at the same time as other vaccines. There is a combination vaccine called MMRV that contains both chickenpox and MMR vaccines. MMRV is an option for some children 12 months through 42 years of age. There is a separate Vaccine Information Statement for MMRV. Your health care provider can give you more information. 3. Some people should not get this vaccine Tell your vaccine provider if the person getting the vaccine:  Has any severe, life-threatening allergies. A person who has ever had a life-threatening allergic reaction after a dose of MMR vaccine, or has a severe allergy to any part of this vaccine, may be advised not to be vaccinated. Ask your health care provider if you want information about vaccine components.  Is pregnant, or thinks she might be pregnant. Pregnant women should wait to get MMR vaccine until after they are no longer pregnant. Women should avoid getting pregnant for at least 1 month after getting MMR vaccine.  Has a weakened immune system due to disease (such as cancer or HIV/AIDS) or medical treatments (such as radiation, immunotherapy, steroids, or chemotherapy).  Has a parent, brother, or sister with a history of immune system problems.  Has ever  had a condition that makes them bruise or bleed easily.  Has recently had a blood transfusion or received other blood products. You might be advised to postpone MMR vaccination for 3 months or more.  Has tuberculosis.  Has gotten any other vaccines in the past 4 weeks. Live vaccines given too close together might not  work as well.  Is not feeling well. A mild illness, such as a cold, is usually not a reason to postpone a vaccination. Someone who is moderately or severely ill should probably wait. Your doctor can advise you.  4. Risks of a vaccine reaction With any medicine, including vaccines, there is a chance of reactions. These are usually mild and go away on their own, but serious reactions are also possible. Getting MMR vaccine is much safer than getting measles, mumps, or rubella disease. Most people who get MMR vaccine do not have any problems with it. After MMR vaccination, a person might experience: Minor events:  Sore arm from the injection  Fever  Redness or rash at the injection site  Swelling of glands in the cheeks or neck If these events happen, they usually begin within 2 weeks after the shot. They occur less often after the second dose. Moderate events:  Seizure (jerking or staring) often associated with fever  Temporary pain and stiffness in the joints, mostly in teenage or adult women  Temporary low platelet count, which can cause unusual bleeding or bruising  Rash all over body Severe events occur very rarely:  Deafness  Long-term seizures, coma, or lowered consciousness  Brain damage Other things that could happen after this vaccine:  People sometimes faint after medical procedures, including vaccination. Sitting or lying down for about 15 minutes can help prevent fainting and injuries caused by a fall. Tell your provider if you feel dizzy or have vision changes or ringing in the ears.  Some people get shoulder pain that can be more severe and longer-lasting than routine soreness that can follow injections. This happens very rarely.  Any medication can cause a severe allergic reaction. Such reactions to a vaccine are estimated at about 1 in a million doses, and would happen within a few minutes to a few hours after the vaccination. As with any medicine, there is a  very remote chance of a vaccine causing a serious injury or death. The safety of vaccines is always being monitored. For more information, visit: http://www.aguilar.org/ 5. What if there is a serious problem? What should I look for?  Look for anything that concerns you, such as signs of a severe allergic reaction, very high fever, or unusual behavior. Signs of a severe allergic reaction can include hives, swelling of the face and throat, difficulty breathing, a fast heartbeat, dizziness, and weakness. These would usually start a few minutes to a few hours after the vaccination. What should I do?  If you think it is a severe allergic reaction or other emergency that can't wait, call 9-1-1 and get to the nearest hospital. Otherwise, call your health care provider.  Afterward, the reaction should be reported to the Vaccine Adverse Event Reporting System (VAERS). Your doctor should file this report, or you can do it yourself through the VAERS web site at www.vaers.SamedayNews.es, or by calling 865-261-6717. ? VAERS does not give medical advice. 6. The National Vaccine Injury Compensation Program The [employer] Vaccine Injury Compensation Program (VICP) is a federal program that was created to compensate people who may have been injured by certain vaccines. Persons who  Event Reporting System (VAERS). Your doctor should file this report, or you can do it yourself through the VAERS web site at www.vaers.hhs.gov, or by calling 1-800-822-7967.  ? VAERS does not give medical advice.  6. The National Vaccine Injury Compensation Program  The National Vaccine Injury Compensation Program (VICP) is a federal program that was created to compensate people who may have been injured by certain vaccines.  Persons who believe they may have been injured by a vaccine can learn about the program and about filing a claim by calling 1-800-338-2382 or visiting the VICP website at www.hrsa.gov/vaccinecompensation. There is a time limit to file a claim for compensation.  7. How can I learn more?  · Ask your healthcare provider. He or she can give you the vaccine package insert or suggest other sources of information.  · Call your local or state health department.  · Contact the Centers for Disease Control and Prevention (CDC):  ? Call 1-800-232-4636 (1-800-CDC-INFO)  or  ? Visit CDC's website at www.cdc.gov/vaccines  CDC Vaccine Information Statement (VIS) MMR  Vaccine (07/26/2016)  This information is not intended to replace advice given to you by your health care provider. Make sure you discuss any questions you have with your health care provider.  Document Released: 03/28/2006 Document Revised: 08/10/2016 Document Reviewed: 08/10/2016  Elsevier Interactive Patient Education © 2018 Elsevier Inc.

## 2017-11-16 LAB — MEASLES/MUMPS/RUBELLA IMMUNITY
MUMPS ABS, IGG: 87 [AU]/ml (ref >10.9)
RUBEOLA AB, IGG: 152 [AU]/ml (ref >29.9)
Rubella Antibodies, IGG: 1.38 index (ref >0.99)

## 2017-11-16 NOTE — Progress Notes (Signed)
Normal labs.  Pt notified through mychart

## 2017-11-18 ENCOUNTER — Encounter: Payer: Self-pay | Admitting: Unknown Physician Specialty

## 2017-11-18 ENCOUNTER — Telehealth: Payer: Self-pay

## 2017-11-18 DIAGNOSIS — F4321 Adjustment disorder with depressed mood: Secondary | ICD-10-CM

## 2017-11-18 LAB — IGP, APTIMA HPV, RFX 16/18,45
HPV APTIMA: NEGATIVE
PAP Smear Comment: 0

## 2017-11-18 NOTE — Telephone Encounter (Signed)
Copied from CRM [id number]. Topic: Referral - Request >> Nov 18, 2017 12:59 PM Raquel SarnaHayes, Teresa G wrote: Pt needing to be referred to  - [facility] for grief  counseling by C. Wicker.   [facility], [facility] Address: [address], [place], [place] [address] Phone: [phone]   Routing to provider for referral.

## 2017-11-18 NOTE — Telephone Encounter (Signed)
Put in a referral for Social Work.   Ree [facility] is: StatisticianClinical Social Work/Therapist, MSW, LCSW, LCAS, CCS, SAP

## 2017-11-18 NOTE — Telephone Encounter (Signed)
Referral received and in work queue. Will send Monday 11/22/2017

## 2017-11-18 NOTE — Progress Notes (Signed)
Notified pt by mychart

## 2017-11-18 NOTE — Telephone Encounter (Signed)
I can't see how to put in this referral.  Can you look at this Keri?

## 2017-11-24 ENCOUNTER — Encounter: Payer: Self-pay | Admitting: Physician Assistant

## 2017-11-24 ENCOUNTER — Ambulatory Visit (INDEPENDENT_AMBULATORY_CARE_PROVIDER_SITE_OTHER): Payer: PRIVATE HEALTH INSURANCE | Admitting: Physician Assistant

## 2017-11-24 VITALS — BP 116/73 | HR 100 | Temp 100.2°F | Ht 62.0 in | Wt 224.9 lb

## 2017-11-24 DIAGNOSIS — J069 Acute upper respiratory infection, unspecified: Secondary | ICD-10-CM

## 2017-11-24 DIAGNOSIS — B9789 Other viral agents as the cause of diseases classified elsewhere: Secondary | ICD-10-CM | POA: Diagnosis not present

## 2017-11-24 NOTE — Progress Notes (Signed)
   Subjective:    Patient ID: Holly French, female    DOB: 1975-08-10, 42 y.o.   MRN: [id number]  Holly RousLaura M Yard is a 42 y.o. female presenting on 11/24/2017 for Sinusitis   HPI   Presenting 3 days sinus congestion, malaise, fever, sore throat, cough. Has tried OTC decongestants. No n/v, denies problems breathing.    Social History   Tobacco Use  . Smoking status: Current Every Day Smoker    Packs/day: 0.25    Years: 18.00    Pack years: 4.50    Types: Cigarettes  . Smokeless tobacco: Never Used  Substance Use Topics  . Alcohol use: No    Alcohol/week: 0.0 oz  . Drug use: No    Review of Systems Per HPI unless specifically indicated above     Objective:    BP 116/73 (BP Location: Right Arm, Patient Position: Sitting, Cuff Size: Normal)   Pulse 100   Temp 100.2 F (37.9 C) (Tympanic)   Ht 5\' 2"  (1.575 m)   Wt 224 lb 14.4 oz (102 kg)   LMP 02/25/2009 (Approximate)   SpO2 96%   BMI 41.13 kg/m   Wt Readings from Last 3 Encounters:  11/24/17 224 lb 14.4 oz (102 kg)  11/15/17 224 lb 6.4 oz (101.8 kg)  10/12/17 228 lb 6.4 oz (103.6 kg)    Physical Exam  Constitutional: She appears well-developed and well-nourished. She appears ill.  HENT:  Right Ear: External ear normal.  Left Ear: External ear normal.  Mouth/Throat: Oropharynx is clear and moist. No oropharyngeal exudate.  Eyes: Right eye exhibits discharge. Left eye exhibits discharge.  Neck: Neck supple.  Cardiovascular: Normal rate and regular rhythm.  Pulmonary/Chest: Effort normal and breath sounds normal. No respiratory distress. She has no rales.  Lymphadenopathy:    She has cervical adenopathy.  Skin: Skin is warm and dry.  Psychiatric: She has a normal mood and affect. Her behavior is normal.   Results for orders placed or performed in visit on 11/15/17  Measles/Mumps/Rubella Immunity  Result Value Ref Range   Rubella Antibodies, IGG 1.38 Immune >0.99 index   RUBEOLA AB, IGG 152.0 Immune >29.9  AU/mL   MUMPS ABS, IGG 87.0 Immune >10.9 AU/mL  IGP, Aptima HPV, rfx 16/18,45  Result Value Ref Range   DIAGNOSIS: Comment    Specimen adequacy: Comment    Clinician Provided ICD10 Comment    Performed by: Comment    PAP Smear Comment .    Note: Comment    Test Methodology Comment    HPV Aptima Negative Negative      Assessment & Plan:   1. Viral URI with cough  Counseled on 7-10 duration and course of illness. OTC treatment with Mucinex and cough medication, anti inflammatories for fever. Work note provided. Call back in one week if she has sinus infection for abx, would give Augmentin.    Follow up plan: Return if symptoms worsen or fail to improve.  Osvaldo AngstAdriana Pollak, PA-C [facility] 11/25/2017, 12:12 PM

## 2017-11-24 NOTE — Patient Instructions (Signed)

## 2017-11-29 ENCOUNTER — Telehealth: Payer: Self-pay | Admitting: Unknown Physician Specialty

## 2017-11-29 MED ORDER — AMOXICILLIN-POT CLAVULANATE 875-125 MG PO TABS: 1.0000 | ORAL_TABLET | Freq: Two times a day (BID) | ORAL | 0 refills | Status: DC

## 2017-11-29 NOTE — Telephone Encounter (Signed)
Copied from CRM [id number]. Topic: Quick Communication - See Telephone Encounter >> Nov 29, 2017  1:09 PM Rudi CocoLathan, Rosaleah Person M, NT wrote: CRM for notification. See Telephone encounter for: 11/29/17.  Pt. Stating that she isn't feeling better and would like to have an antibiotic called in   [facility] [place] [facility] [place] - [id number] [place] [address] Phone: [phone] Fax: [phone]

## 2017-11-29 NOTE — Telephone Encounter (Signed)
Patient notified

## 2017-11-29 NOTE — Telephone Encounter (Signed)
abx sent to her pharmacy

## 2018-03-06 ENCOUNTER — Other Ambulatory Visit: Payer: Self-pay | Admitting: Unknown Physician Specialty

## 2018-03-08 ENCOUNTER — Encounter: Payer: Self-pay | Admitting: Physician Assistant

## 2018-03-08 ENCOUNTER — Ambulatory Visit: Payer: PRIVATE HEALTH INSURANCE | Admitting: Physician Assistant

## 2018-03-08 VITALS — BP 142/90 | HR 109 | Temp 97.9°F | Ht 62.0 in | Wt 218.8 lb

## 2018-03-08 DIAGNOSIS — J0110 Acute frontal sinusitis, unspecified: Secondary | ICD-10-CM

## 2018-03-08 DIAGNOSIS — J301 Allergic rhinitis due to pollen: Secondary | ICD-10-CM

## 2018-03-08 MED ORDER — MONTELUKAST SODIUM 10 MG PO TABS: 10.0000 mg | ORAL_TABLET | Freq: Every day | ORAL | 3 refills | Status: DC

## 2018-03-08 MED ORDER — AMOXICILLIN-POT CLAVULANATE 875-125 MG PO TABS: 1.0000 | ORAL_TABLET | Freq: Two times a day (BID) | ORAL | 0 refills | Status: AC

## 2018-03-08 MED ORDER — HYDROCODONE-HOMATROPINE 5-1.5 MG/5ML PO SYRP: 5.0000 mL | ORAL_SOLUTION | Freq: Every evening | ORAL | 0 refills | Status: DC | PRN

## 2018-03-08 NOTE — Patient Instructions (Signed)

## 2018-03-08 NOTE — Progress Notes (Signed)
Subjective:    Patient ID: Holly French, female    DOB: 10-10-1975, 42 y.o.   MRN: [id number]  Holly French is a 42 y.o. female presenting on 03/08/2018 for URI (pt states she has been having congestion, cough, and sinus drainage for the past week)   HPI   Reports > 1 week worsening sinus congestion, cough, and drainage. Denies fevers and chills. Denies shortness of breath. Does have allergies, takes flonase and 2nd gen antihistamine.   Social History   Tobacco Use  . Smoking status: Current Every Day Smoker    Packs/day: 0.25    Years: 18.00    Pack years: 4.50    Types: Cigarettes  . Smokeless tobacco: Never Used  Substance Use Topics  . Alcohol use: No    Alcohol/week: 0.0 standard drinks  . Drug use: No    Review of Systems Per HPI unless specifically indicated above     Objective:    BP (!) 142/90   Pulse (!) 109   Temp 97.9 F (36.6 C) (Oral)   Ht 5\' 2"  (1.575 m)   Wt 218 lb 12.8 oz (99.2 kg)   LMP 02/25/2009 (Approximate)   SpO2 98%   BMI 40.02 kg/m   Wt Readings from Last 3 Encounters:  03/08/18 218 lb 12.8 oz (99.2 kg)  11/24/17 224 lb 14.4 oz (102 kg)  11/15/17 224 lb 6.4 oz (101.8 kg)    Physical Exam  Constitutional: She is oriented to person, place, and time. She appears well-developed and well-nourished. No distress.  HENT:  Right Ear: External ear normal.  Left Ear: External ear normal.  Nose: Right sinus exhibits maxillary sinus tenderness and frontal sinus tenderness. Left sinus exhibits maxillary sinus tenderness and frontal sinus tenderness.  Mouth/Throat: Oropharynx is clear and moist. No oropharyngeal exudate, posterior oropharyngeal edema or posterior oropharyngeal erythema.  Tms opaque bilaterally   Eyes: Conjunctivae are normal. Right eye exhibits no discharge. Left eye exhibits no discharge.  Neck: Neck supple.  Cardiovascular: Normal rate and regular rhythm.  Pulmonary/Chest: Effort normal and breath sounds normal.    Lymphadenopathy:    She has cervical adenopathy.  Neurological: She is alert and oriented to person, place, and time.  Skin: Skin is warm and dry. She is not diaphoretic.  Psychiatric: She has a normal mood and affect. Her behavior is normal.   Results for orders placed or performed in visit on 11/15/17  Measles/Mumps/Rubella Immunity  Result Value Ref Range   Rubella Antibodies, IGG 1.38 Immune >0.99 index   RUBEOLA AB, IGG 152.0 Immune >29.9 AU/mL   MUMPS ABS, IGG 87.0 Immune >10.9 AU/mL  IGP, Aptima HPV, rfx 16/18,45  Result Value Ref Range   DIAGNOSIS: Comment    Specimen adequacy: Comment    Clinician Provided ICD10 Comment    Performed by: Comment    PAP Smear Comment .    Note: Comment    Test Methodology Comment    HPV Aptima Negative Negative      Assessment & Plan:  1. Acute non-recurrent frontal sinusitis  - amoxicillin-clavulanate (AUGMENTIN) 875-125 MG tablet; Take 1 tablet by mouth 2 (two) times daily for 10 days.  Dispense: 20 tablet; Refill: 0 - HYDROcodone-homatropine (HYCODAN) 5-1.5 MG/5ML syrup; Take 5 mLs by mouth at bedtime as needed for cough.  Dispense: 120 mL; Refill: 0  2. Allergic rhinitis due to pollen, unspecified seasonality  - montelukast (SINGULAIR) 10 MG tablet; Take 1 tablet (10 mg total) by mouth at bedtime.  Dispense: 30 tablet; Refill: 3    Follow up plan: Return if symptoms worsen or fail to improve.  Osvaldo Angst, PA-C [facility] 03/08/2018, 3:54 PM

## 2018-03-24 ENCOUNTER — Encounter: Payer: Self-pay | Admitting: Family Medicine

## 2018-03-24 ENCOUNTER — Ambulatory Visit: Payer: PRIVATE HEALTH INSURANCE | Admitting: Family Medicine

## 2018-03-24 VITALS — BP 141/89 | HR 106 | Temp 98.7°F | Ht 62.0 in | Wt 221.0 lb

## 2018-03-24 DIAGNOSIS — L259 Unspecified contact dermatitis, unspecified cause: Secondary | ICD-10-CM | POA: Diagnosis not present

## 2018-03-24 DIAGNOSIS — R21 Rash and other nonspecific skin eruption: Secondary | ICD-10-CM

## 2018-03-24 MED ORDER — TRIAMCINOLONE ACETONIDE 40 MG/ML IJ SUSP
40.0000 mg | Freq: Once | INTRAMUSCULAR | Status: AC
  Administered 2018-03-24: 40 mg via INTRAMUSCULAR

## 2018-03-24 MED ORDER — CLOBETASOL PROPIONATE 0.05 % EX OINT: 1.0000 "application " | TOPICAL_OINTMENT | Freq: Two times a day (BID) | CUTANEOUS | 0 refills | Status: DC

## 2018-03-24 NOTE — Progress Notes (Signed)
BP (!) 141/89   Pulse (!) 106   Temp 98.7 F (37.1 C) (Oral)   Ht 5\' 2"  (1.575 m)   Wt 221 lb (100.2 kg)   LMP 02/25/2009 (Approximate)   SpO2 97%   BMI 40.42 kg/m    Subjective:    Patient ID: Holly French, female    DOB: 10/17/1975, 42 y.o.   MRN: [id number]  HPI: Holly French is a 42 y.o. female  Chief Complaint  Patient presents with  . Rash    left arm, started 10 days ago. States it does itch and burn, has tried hydrocortizone and calamine lotion   Here today for rash of left arm x 10 days. Itching, burning. Seems to be spreading and is now appearing to a much smaller extent on right arm inside elbow. Using calamine and hydrocortisone cream with no benefit. No known exposures or new hygiene products, only change is was recently put on abx and hycodan syrup as well as restarted on singulair for allergies. Has completed the abx and syrup, still taking singulair. Tolerated this well in the past.   Relevant past medical, surgical, family and social history reviewed and updated as indicated. Interim medical history since our last visit reviewed. Allergies and medications reviewed and updated.  Review of Systems  Per HPI unless specifically indicated above     Objective:    BP (!) 141/89   Pulse (!) 106   Temp 98.7 F (37.1 C) (Oral)   Ht 5\' 2"  (1.575 m)   Wt 221 lb (100.2 kg)   LMP 02/25/2009 (Approximate)   SpO2 97%   BMI 40.42 kg/m   Wt Readings from Last 3 Encounters:  03/24/18 221 lb (100.2 kg)  03/08/18 218 lb 12.8 oz (99.2 kg)  11/24/17 224 lb 14.4 oz (102 kg)    Physical Exam  Constitutional: She is oriented to person, place, and time. She appears well-developed and well-nourished. No distress.  HENT:  Head: Atraumatic.  Eyes: Conjunctivae and EOM are normal.  Neck: Normal range of motion. Neck supple.  Cardiovascular: Normal rate and regular rhythm.  Pulmonary/Chest: Effort normal and breath sounds normal.  Musculoskeletal: Normal range of  motion.  Neurological: She is alert and oriented to person, place, and time.  Skin: Skin is warm and dry. Rash (Large area of flexural surface of left arm erythematous with scattered papular rash along edges, mildly ttp. Smaller area of papular rash on right arm) noted.  Psychiatric: She has a normal mood and affect. Her behavior is normal.  Nursing note and vitals reviewed.   Results for orders placed or performed in visit on 11/15/17  Measles/Mumps/Rubella Immunity  Result Value Ref Range   Rubella Antibodies, IGG 1.38 Immune >0.99 index   RUBEOLA AB, IGG 152.0 Immune >29.9 AU/mL   MUMPS ABS, IGG 87.0 Immune >10.9 AU/mL  IGP, Aptima HPV, rfx 16/18,45  Result Value Ref Range   DIAGNOSIS: Comment    Specimen adequacy: Comment    Clinician Provided ICD10 Comment    Performed by: Comment    PAP Smear Comment .    Note: Comment    Test Methodology Comment    HPV Aptima Negative Negative      Assessment & Plan:   Problem List Items Addressed This Visit      Musculoskeletal and Integument   Contact dermatitis - Primary    Unknown trigger, tx with IM kenalog in clinic, clobetasol ointment, good moisturizer regimen BID. Keep covered as much as  possible. F/u if worsening or not improving.        Other Visit Diagnoses    Rash       Relevant Medications   triamcinolone acetonide (KENALOG-40) injection 40 mg (Completed)       Follow up plan: Return if symptoms worsen or fail to improve.

## 2018-03-24 NOTE — Patient Instructions (Signed)
Follow up as needed

## 2018-03-24 NOTE — Assessment & Plan Note (Signed)
Unknown trigger, tx with IM kenalog in clinic, clobetasol ointment, good moisturizer regimen BID. Keep covered as much as possible. F/u if worsening or not improving.

## 2018-04-11 ENCOUNTER — Telehealth: Payer: Self-pay | Admitting: Family Medicine

## 2018-04-11 ENCOUNTER — Other Ambulatory Visit: Payer: Self-pay | Admitting: Nurse Practitioner

## 2018-04-11 MED ORDER — CLOBETASOL PROPIONATE 0.05 % EX OINT: 1.0000 "application " | TOPICAL_OINTMENT | Freq: Two times a day (BID) | CUTANEOUS | 0 refills | Status: DC

## 2018-04-11 NOTE — Progress Notes (Signed)
Patient request refill for Clobetasol ointment.  Refill sent to pharmacy.

## 2018-04-11 NOTE — Telephone Encounter (Signed)
Sent refill she requested.  She saw Fleet Contras that day for contact dermatitis.  If rash worsening, then she needs to return.  No other ointment ordered at this time, as I did not see rash.

## 2018-04-11 NOTE — Telephone Encounter (Signed)
Copied from CRM [id number]. Topic: General - Other >> Apr 11, 2018 11:53 AM Stephannie Li, NT wrote: Reason for CRM: Patient says she would like a prescription called for the rash she was seen for on 03/24/18 and she would also like a refill of the clobetasol ointment (TEMOVATE) 0.05 % sent to [facility] [place], [place] - [address] [phone] (Phone) [phone] (Fax)

## 2018-06-02 ENCOUNTER — Other Ambulatory Visit: Payer: Self-pay | Admitting: Physician Assistant

## 2018-06-28 ENCOUNTER — Encounter: Payer: Self-pay | Admitting: Nurse Practitioner

## 2018-06-28 ENCOUNTER — Other Ambulatory Visit: Payer: Self-pay

## 2018-06-28 ENCOUNTER — Ambulatory Visit (INDEPENDENT_AMBULATORY_CARE_PROVIDER_SITE_OTHER): Payer: PRIVATE HEALTH INSURANCE | Admitting: Nurse Practitioner

## 2018-06-28 VITALS — BP 127/85 | HR 109 | Temp 98.3°F | Ht 62.0 in | Wt 221.0 lb

## 2018-06-28 DIAGNOSIS — L259 Unspecified contact dermatitis, unspecified cause: Secondary | ICD-10-CM | POA: Diagnosis not present

## 2018-06-28 MED ORDER — TRIAMCINOLONE ACETONIDE 40 MG/ML IJ SUSP
40.0000 mg | Freq: Once | INTRAMUSCULAR | Status: AC
Start: 2018-06-28 — End: 2018-06-28
  Administered 2018-06-28: 40 mg via INTRAMUSCULAR

## 2018-06-28 MED ORDER — PREDNISONE 10 MG PO TABS: 30.0000 mg | ORAL_TABLET | Freq: Every day | ORAL | 0 refills | Status: AC

## 2018-06-28 MED ORDER — CLOBETASOL PROPIONATE 0.05 % EX OINT: 1.0000 "application " | TOPICAL_OINTMENT | Freq: Two times a day (BID) | CUTANEOUS | 1 refills | Status: DC

## 2018-06-28 NOTE — Progress Notes (Signed)
BP 127/85   Pulse (!) 109   Temp 98.3 F (36.8 C) (Oral)   Ht 5\' 2"  (1.575 m)   Wt 221 lb (100.2 kg)   LMP 02/25/2009 (Approximate)   SpO2 92%   BMI 40.42 kg/m    Subjective:    Patient ID: Holly French, female    DOB: Jul 14, 1975, 43 y.o.   MRN: [id number]  HPI: Holly French is a 43 y.o. female  Chief Complaint  Patient presents with  . Rash    pt states has had rashes all over her body, ithing and burning for over a month/ he/has tried OTC meds   RASH Has had rash x one month, seen in office 03/24/18 and was treated with Kenalog x 1 injection and Clobetasol.  The rash "left" and then returned a month ago.  She reports she switched to cheaper fabric softener and transitioned from Aveeno body wash vs Dove unscented; then the rash returned worse than previously.  She has gone back to Campbell SoupSnuggle and Dove unscented, which she reports do not cause her to break out. Duration:  weeks  Location: generalized  Itching: yes Burning: yes Redness: yes Oozing: no Scaling: no Blisters: no Painful: no Fevers: no Change in detergents/soaps/personal care products: yes Recent illness: no Recent travel:no History of same: yes Context: contacts with the same Alleviating factors: hydrocortisone cream Treatments attempted:hydrocortisone cream Shortness of breath: no  Throat/tongue swelling: no Myalgias/arthralgias: no  Relevant past medical, surgical, family and social history reviewed and updated as indicated. Interim medical history since our last visit reviewed. Allergies and medications reviewed and updated.  Review of Systems  Constitutional: Negative for activity change, appetite change, diaphoresis, fatigue and fever.  Respiratory: Negative for cough, chest tightness and shortness of breath.   Cardiovascular: Negative for chest pain, palpitations and leg swelling.  Gastrointestinal: Negative for abdominal distention, abdominal pain, constipation, diarrhea, nausea and  vomiting.  Endocrine: Negative for cold intolerance, heat intolerance, polydipsia, polyphagia and polyuria.  Skin: Positive for rash (generalized).  Neurological: Negative for dizziness, syncope, weakness, light-headedness, numbness and headaches.  Psychiatric/Behavioral: Negative.     Per HPI unless specifically indicated above     Objective:    BP 127/85   Pulse (!) 109   Temp 98.3 F (36.8 C) (Oral)   Ht 5\' 2"  (1.575 m)   Wt 221 lb (100.2 kg)   LMP 02/25/2009 (Approximate)   SpO2 92%   BMI 40.42 kg/m   Wt Readings from Last 3 Encounters:  06/28/18 221 lb (100.2 kg)  03/24/18 221 lb (100.2 kg)  03/08/18 218 lb 12.8 oz (99.2 kg)    Physical Exam Vitals signs and nursing note reviewed.  Constitutional:      General: She is awake.     Appearance: She is well-developed.  HENT:     Head: Normocephalic.     Right Ear: Hearing normal.     Left Ear: Hearing normal.     Nose: Nose normal.     Mouth/Throat:     Mouth: Mucous membranes are moist.  Eyes:     General: Lids are normal.        Right eye: No discharge.        Left eye: No discharge.     Conjunctiva/sclera: Conjunctivae normal.     Pupils: Pupils are equal, round, and reactive to light.  Neck:     Musculoskeletal: Normal range of motion and neck supple.     Thyroid: No thyromegaly.  Vascular: No carotid bruit or JVD.  Cardiovascular:     Rate and Rhythm: Normal rate and regular rhythm.     Heart sounds: Normal heart sounds. No murmur. No gallop.   Pulmonary:     Effort: Pulmonary effort is normal.     Breath sounds: Normal breath sounds.  Abdominal:     General: Bowel sounds are normal.     Palpations: Abdomen is soft. There is no hepatomegaly or splenomegaly.  Musculoskeletal:     Right lower leg: No edema.     Left lower leg: No edema.  Lymphadenopathy:     Cervical: No cervical adenopathy.  Skin:    General: Skin is warm and dry.     Findings: Rash present. Rash is papular.     Comments: Large  scattered areas of papular erythema to bilateral arms, trunk, lower back, and upper chest. Skin intact.  No vesicles present.  Neurological:     Mental Status: She is alert and oriented to person, place, and time.  Psychiatric:        Attention and Perception: Attention normal.        Mood and Affect: Mood normal.        Behavior: Behavior normal. Behavior is cooperative.        Thought Content: Thought content normal.        Judgment: Judgment normal.     Results for orders placed or performed in visit on 11/15/17  Measles/Mumps/Rubella Immunity  Result Value Ref Range   Rubella Antibodies, IGG 1.38 Immune >0.99 index   RUBEOLA AB, IGG 152.0 Immune >29.9 AU/mL   MUMPS ABS, IGG 87.0 Immune >10.9 AU/mL  IGP, Aptima HPV, rfx 16/18,45  Result Value Ref Range   DIAGNOSIS: Comment    Specimen adequacy: Comment    Clinician Provided ICD10 Comment    Performed by: Comment    PAP Smear Comment .    Note: Comment    Test Methodology Comment    HPV Aptima Negative Negative      Assessment & Plan:   Problem List Items Addressed This Visit      Musculoskeletal and Integument   Contact dermatitis - Primary    Previously improvement with Kenalog IM + Clobetasol.  Return of rash present d/t change in detergent and soap at home.  Will provide Kenalog in office today.  Script for Clobetasol and Prednisone sent to pharmacy.  She has returned to previous detergent and soap which did not cause issue.  Return for worsening or continued symptoms.        Relevant Medications   triamcinolone acetonide (KENALOG-40) injection 40 mg       Follow up plan: Return if symptoms worsen or fail to improve.

## 2018-06-28 NOTE — Assessment & Plan Note (Signed)
Previously improvement with Kenalog IM + Clobetasol.  Return of rash present d/t change in detergent and soap at home.  Will provide Kenalog in office today.  Script for Clobetasol and Prednisone sent to pharmacy.  She has returned to previous detergent and soap which did not cause issue.  Return for worsening or continued symptoms.

## 2018-06-28 NOTE — Patient Instructions (Signed)
Contact Dermatitis  Dermatitis is redness, soreness, and swelling (inflammation) of the skin. Contact dermatitis is a reaction to something that touches the skin.  There are two types of contact dermatitis:   Irritant contact dermatitis. This happens when something bothers (irritates) your skin, like soap.   Allergic contact dermatitis. This is caused when you are exposed to something that you are allergic to, such as poison ivy.  What are the causes?   Common causes of irritant contact dermatitis include:  ? Makeup.  ? Soaps.  ? Detergents.  ? Bleaches.  ? Acids.  ? Metals, such as nickel.   Common causes of allergic contact dermatitis include:  ? Plants.  ? Chemicals.  ? Jewelry.  ? Latex.  ? Medicines.  ? Preservatives in products, such as clothing.  What increases the risk?   Having a job that exposes you to things that bother your skin.   Having asthma or eczema.  What are the signs or symptoms?  Symptoms may happen anywhere the irritant has touched your skin. Symptoms include:   Dry or flaky skin.   Redness.   Cracks.   Itching.   Pain or a burning feeling.   Blisters.   Blood or clear fluid draining from skin cracks.  With allergic contact dermatitis, swelling may occur. This may happen in places such as the eyelids, mouth, or genitals.  How is this treated?   This condition is treated by checking for the cause of the reaction and protecting your skin. Treatment may also include:  ? Steroid creams, ointments, or medicines.  ? Antibiotic medicines or other ointments, if you have a skin infection.  ? Lotion or medicines to help with itching.  ? A bandage (dressing).  Follow these instructions at home:  Skin care   Moisturize your skin as needed.   Put cool cloths on your skin.   Put a baking soda paste on your skin. Stir water into baking soda until it looks like a paste.   Do not scratch your skin.   Avoid having things rub up against your skin.   Avoid the use of soaps, perfumes, and  dyes.  Medicines   Take or apply over-the-counter and prescription medicines only as told by your doctor.   If you were prescribed an antibiotic medicine, take or apply it as told by your doctor. Do not stop using it even if your condition starts to get better.  Bathing   Take a bath with:  ? Epsom salts.  ? Baking soda.  ? Colloidal oatmeal.   Bathe less often.   Bathe in warm water. Avoid using hot water.  Bandage care   If you were given a bandage, change it as told by your health care provider.   Wash your hands with soap and water before and after you change your bandage. If soap and water are not available, use hand sanitizer.  General instructions   Avoid the things that caused your reaction. If you do not know what caused it, keep a journal. Write down:  ? What you eat.  ? What skin products you use.  ? What you drink.  ? What you wear in the area that has symptoms. This includes jewelry.   Check the affected areas every day for signs of infection. Check for:  ? More redness, swelling, or pain.  ? More fluid or blood.  ? Warmth.  ? Pus or a bad smell.   Keep all follow-up visits as   told by your doctor. This is important.  Contact a doctor if:   You do not get better with treatment.   Your condition gets worse.   You have signs of infection, such as:  ? More swelling.  ? Tenderness.  ? More redness.  ? Soreness.  ? Warmth.   You have a fever.   You have new symptoms.  Get help right away if:   You have a very bad headache.   You have neck pain.   Your neck is stiff.   You throw up (vomit).   You feel very sleepy.   You see red streaks coming from the area.   Your bone or joint near the area hurts after the skin has healed.   The area turns darker.   You have trouble breathing.  Summary   Dermatitis is redness, soreness, and swelling of the skin.   Symptoms may occur where the irritant has touched you.   Treatment may include medicines and skin care.   If you do not know what caused  your reaction, keep a journal.   Contact a doctor if your condition gets worse or you have signs of infection.  This information is not intended to replace advice given to you by your health care provider. Make sure you discuss any questions you have with your health care provider.  Document Released: 03/28/2009 Document Revised: 12/14/2017 Document Reviewed: 12/14/2017  Elsevier Interactive Patient Education  2019 [employer] Inc[employer]

## 2018-07-31 ENCOUNTER — Other Ambulatory Visit: Payer: Self-pay | Admitting: Nurse Practitioner

## 2018-07-31 ENCOUNTER — Telehealth: Payer: Self-pay | Admitting: Nurse Practitioner

## 2018-07-31 MED ORDER — LOSARTAN POTASSIUM 25 MG PO TABS: 50.0000 mg | ORAL_TABLET | Freq: Every day | ORAL | 3 refills | Status: DC

## 2018-07-31 NOTE — Telephone Encounter (Signed)
Copied from CRM [id number]. Topic: Quick Communication - See Telephone Encounter >> Jul 31, 2018  9:33 AM Lorrine Kin, NT wrote: CRM for notification. See Telephone encounter for: 07/31/18. Lurena Joiner with [facility] calling and states that the losartan (COZAAR) 50 MG tablet is on a national back order. Would like to know if the prescription could be changed to losartan (COZAAR) 25 MG tablet and have the patient take 2 tablets a day? Please advise. [facility] - [place], [place] - [address]

## 2018-07-31 NOTE — Telephone Encounter (Signed)
Requested medication (s) are due for refill today -yes  Requested medication (s) are on the active medication list -yes  Future visit scheduled -no  Last refill: 06/02/18  Notes to clinic: Patient meets visit protocol- but does not pass lab requirement for protocol. Sent for review of request by provider  Requested Prescriptions  Pending Prescriptions Disp Refills   losartan (COZAAR) 50 MG tablet [Pharmacy Med Name: LOSARTAN POTASSIUM 50 MG TAB] 30 tablet 0    Sig: TAKE ONE TABLET BY MOUTH DAILY     Cardiovascular:  Angiotensin Receptor Blockers Failed - 07/31/2018  6:20 AM      Failed - Cr in normal range and within 180 days    Creatinine, Ser  Date Value Ref Range Status  10/12/2017 0.74 0.57 - 1.00 mg/dL Final         Failed - K in normal range and within 180 days    Potassium  Date Value Ref Range Status  10/12/2017 4.1 3.5 - 5.2 mmol/L Final         Passed - Patient is not pregnant      Passed - Last BP in normal range    BP Readings from Last 1 Encounters:  06/28/18 127/85         Passed - Valid encounter within last 6 months    Recent Outpatient Visits          1 month ago Contact dermatitis, unspecified contact dermatitis type, unspecified trigger   [place], Jolene T, NP   4 months ago Contact dermatitis, unspecified contact dermatitis type, unspecified trigger   [facility] Volney American, PA-C   4 months ago Acute non-recurrent frontal sinusitis   [facility] Ter[facility] Croak, Adriana M, PA-C   8 months ago Viral URI with cough   [facility] Terrilee Croak, Adriana M, PA-C   8 months ago Need for MMR vaccine   [facility] Kathrine Haddock, NP              Requested Prescriptions  Pending Prescriptions Disp Refills   losartan (COZAAR) 50 MG tablet [Pharmacy Med Name: LOSARTAN POTASSIUM 50 MG TAB] 30 tablet 0    Sig: TAKE ONE TABLET BY MOUTH DAILY     Cardiovascular:  Angiotensin  Receptor Blockers Failed - 07/31/2018  6:20 AM      Failed - Cr in normal range and within 180 days    Creatinine, Ser  Date Value Ref Range Status  10/12/2017 0.74 0.57 - 1.00 mg/dL Final         Failed - K in normal range and within 180 days    Potassium  Date Value Ref Range Status  10/12/2017 4.1 3.5 - 5.2 mmol/L Final         Passed - Patient is not pregnant      Passed - Last BP in normal range    BP Readings from Last 1 Encounters:  06/28/18 127/85         Passed - Valid encounter within last 6 months    Recent Outpatient Visits          1 month ago Contact dermatitis, unspecified contact dermatitis type, unspecified trigger   [facility] [place], Jolene T, NP   4 months ago Contact dermatitis, unspecified contact dermatitis type, unspecified trigger   [facility] Volney American, [place]   4 months ago Acute non-recurrent frontal sinusitis   [facility], Fabio Bering M, [place]  8 months ago Viral URI with cough   [place], PA-C   8 months ago Need for MMR vaccine   [facility] Kathrine Haddock, NP

## 2018-07-31 NOTE — Progress Notes (Signed)
Losartan 50 mg is on national back order.  Pharmacy has recommended Losartan 25 MG tablets x 2 a day until return of 50 MG tablets.  Script sent.

## 2018-08-03 ENCOUNTER — Encounter: Payer: Self-pay | Admitting: Family Medicine

## 2018-08-03 ENCOUNTER — Ambulatory Visit (INDEPENDENT_AMBULATORY_CARE_PROVIDER_SITE_OTHER): Payer: PRIVATE HEALTH INSURANCE | Admitting: Family Medicine

## 2018-08-03 ENCOUNTER — Other Ambulatory Visit: Payer: Self-pay

## 2018-08-03 VITALS — BP 130/85 | HR 104 | Temp 98.9°F | Ht 62.0 in | Wt 220.6 lb

## 2018-08-03 DIAGNOSIS — J0100 Acute maxillary sinusitis, unspecified: Secondary | ICD-10-CM

## 2018-08-03 MED ORDER — AMOXICILLIN-POT CLAVULANATE 875-125 MG PO TABS: 1.0000 | ORAL_TABLET | Freq: Two times a day (BID) | ORAL | 0 refills | Status: DC

## 2018-08-03 MED ORDER — HYDROCOD POLST-CPM POLST ER 10-8 MG/5ML PO SUER: 5.0000 mL | Freq: Every evening | ORAL | 0 refills | Status: DC | PRN

## 2018-08-03 NOTE — Progress Notes (Signed)
BP 130/85 (BP Location: Left Arm, Patient Position: Sitting, Cuff Size: Large)   Pulse (!) 104   Temp 98.9 F (37.2 C) (Oral)   Ht 5\' 2"  (1.575 m)   Wt 220 lb 9 oz (100 kg)   LMP 02/25/2009 (Approximate)   SpO2 97%   BMI 40.34 kg/m    Subjective:    Patient ID: Holly French, female    DOB: Apr 30, 1976, 43 y.o.   MRN: [id number]  HPI: TURKESSA HENNEN is a 43 y.o. female  Chief Complaint  Patient presents with  . Cough    Started about two weeks  . Headache  . Sore Throat    scratchy and its worse at pm/am time  . Generalized Body Aches    pt states just a little   Sinus pain and pressure, congestion, cough, fatigue, achiness x 2 weeks. Trying tylenol cold and sinus and delsym with no relief as well as saline rinses. Denies fevers, chills, Cp, SOB. Several sick contacts. Hx of recurrent sinus infections s/p 3 sinus surgeries in the past. On zyrtec and flonase regimen  Relevant past medical, surgical, family and social history reviewed and updated as indicated. Interim medical history since our last visit reviewed. Allergies and medications reviewed and updated.  Review of Systems  Per HPI unless specifically indicated above     Objective:    BP 130/85 (BP Location: Left Arm, Patient Position: Sitting, Cuff Size: Large)   Pulse (!) 104   Temp 98.9 F (37.2 C) (Oral)   Ht 5\' 2"  (1.575 m)   Wt 220 lb 9 oz (100 kg)   LMP 02/25/2009 (Approximate)   SpO2 97%   BMI 40.34 kg/m   Wt Readings from Last 3 Encounters:  08/03/18 220 lb 9 oz (100 kg)  06/28/18 221 lb (100.2 kg)  03/24/18 221 lb (100.2 kg)    Physical Exam Vitals signs and nursing note reviewed.  Constitutional:      Appearance: Normal appearance.  HENT:     Head: Atraumatic.     Comments: B/l maxillary sinuses ttp    Right Ear: Tympanic membrane and external ear normal.     Left Ear: Tympanic membrane and external ear normal.     Nose: Congestion present.     Mouth/Throat:     Mouth: Mucous  membranes are moist.     Pharynx: Posterior oropharyngeal erythema present.  Eyes:     Extraocular Movements: Extraocular movements intact.     Conjunctiva/sclera: Conjunctivae normal.  Neck:     Musculoskeletal: Normal range of motion and neck supple.  Cardiovascular:     Rate and Rhythm: Normal rate and regular rhythm.     Heart sounds: Normal heart sounds.  Pulmonary:     Effort: Pulmonary effort is normal.     Breath sounds: Normal breath sounds. No wheezing.  Musculoskeletal: Normal range of motion.  Lymphadenopathy:     Cervical: No cervical adenopathy.  Skin:    General: Skin is warm and dry.  Neurological:     Mental Status: She is alert and oriented to person, place, and time.  Psychiatric:        Mood and Affect: Mood normal.        Thought Content: Thought content normal.     Results for orders placed or performed in visit on 11/15/17  Measles/Mumps/Rubella Immunity  Result Value Ref Range   Rubella Antibodies, IGG 1.38 Immune >0.99 index   RUBEOLA AB, IGG 152.0 Immune >29.9 AU/mL  MUMPS ABS, IGG 87.0 Immune >10.9 AU/mL  IGP, Aptima HPV, rfx 16/18,45  Result Value Ref Range   DIAGNOSIS: Comment    Specimen adequacy: Comment    Clinician Provided ICD10 Comment    Performed by: Comment    PAP Smear Comment .    Note: Comment    Test Methodology Comment    HPV Aptima Negative Negative      Assessment & Plan:   Problem List Items Addressed This Visit    None    Visit Diagnoses    Acute maxillary sinusitis, recurrence not specified    -  Primary   Tx with augmentin, mucinex, tussionex. Sedation precautions and supportive care reviewed. F/u if not better   Relevant Medications   amoxicillin-clavulanate (AUGMENTIN) 875-125 MG tablet   chlorpheniramine-HYDROcodone (TUSSIONEX PENNKINETIC ER) 10-8 MG/5ML SUER       Follow up plan: Return if symptoms worsen or fail to improve.

## 2018-12-29 ENCOUNTER — Encounter: Payer: Self-pay | Admitting: Family Medicine

## 2018-12-29 ENCOUNTER — Ambulatory Visit (INDEPENDENT_AMBULATORY_CARE_PROVIDER_SITE_OTHER): Payer: PRIVATE HEALTH INSURANCE | Admitting: Family Medicine

## 2018-12-29 ENCOUNTER — Other Ambulatory Visit: Payer: Self-pay

## 2018-12-29 VITALS — BP 115/79 | HR 99 | Temp 98.2°F | Ht 62.0 in | Wt 226.0 lb

## 2018-12-29 DIAGNOSIS — J705 Respiratory conditions due to smoke inhalation: Secondary | ICD-10-CM | POA: Diagnosis not present

## 2018-12-29 DIAGNOSIS — R05 Cough: Secondary | ICD-10-CM | POA: Diagnosis not present

## 2018-12-29 DIAGNOSIS — F419 Anxiety disorder, unspecified: Secondary | ICD-10-CM | POA: Diagnosis not present

## 2018-12-29 DIAGNOSIS — J4521 Mild intermittent asthma with (acute) exacerbation: Secondary | ICD-10-CM

## 2018-12-29 DIAGNOSIS — T59811A Toxic effect of smoke, accidental (unintentional), initial encounter: Secondary | ICD-10-CM

## 2018-12-29 DIAGNOSIS — R059 Cough, unspecified: Secondary | ICD-10-CM

## 2018-12-29 MED ORDER — ALPRAZOLAM 0.25 MG PO TABS: 0.2500 mg | ORAL_TABLET | Freq: Every evening | ORAL | 0 refills | Status: DC | PRN

## 2018-12-29 MED ORDER — PREDNISONE 10 MG PO TABS: ORAL_TABLET | ORAL | 0 refills | Status: DC

## 2018-12-29 MED ORDER — ALBUTEROL SULFATE HFA 108 (90 BASE) MCG/ACT IN AERS: 2.0000 | INHALATION_SPRAY | Freq: Four times a day (QID) | RESPIRATORY_TRACT | 0 refills | Status: DC | PRN
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
## 2019-01-25 ENCOUNTER — Telehealth: Payer: Self-pay | Admitting: Family Medicine

## 2019-01-25 ENCOUNTER — Other Ambulatory Visit: Payer: Self-pay | Admitting: Nurse Practitioner

## 2019-01-25 NOTE — Telephone Encounter (Signed)
Called patient. No answer. Left VM for patient to return call.

## 2019-01-25 NOTE — Telephone Encounter (Signed)
Will need provider visit for further refills

## 2019-01-25 NOTE — Telephone Encounter (Signed)
Pt returning call. Please advise. °

## 2019-01-25 NOTE — Telephone Encounter (Signed)
Forwarding medication refill request to PCP for review. 

## 2019-01-25 NOTE — Telephone Encounter (Signed)
Called and spoke to patient. Message relayed. Scheduled OV 02/02/19 at 9:30 am

## 2019-01-25 NOTE — Telephone Encounter (Signed)
Routing to provider  

## 2019-01-25 NOTE — Telephone Encounter (Signed)
This was meant for short period only, prescribed by Apolonio Schneiders and noted in documentation.  For further refills or discussion patient would need to be seen.

## 2019-01-25 NOTE — Telephone Encounter (Signed)
Called pt back she is aware that she would need appt for refills

## 2019-01-25 NOTE — Telephone Encounter (Signed)
Will refill for 60 days, but patient will need chronic disease visit at that point

## 2019-02-01 DIAGNOSIS — F419 Anxiety disorder, unspecified: Secondary | ICD-10-CM | POA: Insufficient documentation

## 2019-02-02 ENCOUNTER — Encounter: Payer: Self-pay | Admitting: Nurse Practitioner

## 2019-02-02 ENCOUNTER — Other Ambulatory Visit: Payer: Self-pay

## 2019-02-02 ENCOUNTER — Ambulatory Visit (INDEPENDENT_AMBULATORY_CARE_PROVIDER_SITE_OTHER): Payer: PRIVATE HEALTH INSURANCE | Admitting: Nurse Practitioner

## 2019-02-02 DIAGNOSIS — I10 Essential (primary) hypertension: Secondary | ICD-10-CM

## 2019-02-02 DIAGNOSIS — F4310 Post-traumatic stress disorder, unspecified: Secondary | ICD-10-CM | POA: Insufficient documentation

## 2019-02-02 MED ORDER — ALPRAZOLAM 0.25 MG PO TABS: 0.2500 mg | ORAL_TABLET | Freq: Every evening | ORAL | 0 refills | Status: DC | PRN

## 2019-02-02 MED ORDER — LOSARTAN POTASSIUM 100 MG PO TABS: 100.0000 mg | ORAL_TABLET | Freq: Every day | ORAL | 3 refills | Status: DC
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
## 2019-02-02 NOTE — Assessment & Plan Note (Addendum)
Chronic, not at goal.  Elevated on initial and on repeat today.  Script sent to increase Losartan to 100 MG daily.  Have recommended she check BP at home daily and document for provider.  Return in 4 weeks for BP check and labs.

## 2019-02-02 NOTE — Patient Instructions (Signed)
Managing Post-Traumatic Stress Disorder If you have been diagnosed with post-traumatic stress disorder (PTSD), you may be relieved that you now know why you have felt or behaved a certain way. Still, you may feel overwhelmed about the treatment ahead. You may also wonder how to get the support you need and how to deal with the condition day-to-day. If you are living with PTSD, there are ways to help you recover from it and manage your symptoms. How to manage lifestyle changes Managing stress Stress is your body's reaction to life changes and events, both good and bad. Stress can make PTSD worse. Take the following steps to manage stress:  Talk with your health care provider or a counselor if you would like to learn more about techniques to reduce your stress. He or she may suggest some stress reduction techniques such as: ? Muscle relaxation exercises. ? Regular exercise. ? Meditation, yoga, or other mind-body exercises. ? Breathing exercises. ? Listening to quiet music. ? Spending time outside.  Maintain a healthy lifestyle. Eat a healthy diet, exercise regularly, get plenty of sleep, and take time to relax.  Spend time with others. Talk with them about how you are feeling and what kind of support you need. Try not to isolate yourself, even though you may feel like doing that. Isolating yourself can delay your recovery.  Do activities and hobbies that you enjoy.  Pace yourself when doing stressful things. Take breaks, and reward yourself when you finish. Make sure that you do not overload your schedule.  Medicines Your health care provider may suggest certain medicines if he or she feels that they will help to improve your condition. Medicines for depression (antidepressants) or severe loss of contact with reality (antipsychotics) may be used to treat PTSD. Avoid using alcohol and other substances that may prevent your medicines from working properly. It is also important to:  Talk with  your pharmacist or health care provider about all medicines that you take, their possible side effects, and which medicines are safe to take together.  Make it your goal to take part in all treatment decisions (shared decision-making). Ask about possible side effects of medicines that your health care provider recommends, and tell him or her how you feel about having those side effects. It is best if shared decision-making with your health care provider is part of your total treatment plan. If your health care provider prescribes a medicine, you may not notice the full benefits of it for 4-8 weeks. Most people who are treated for PTSD need to take medicine for at least 6-12 months after they feel better. If you are taking medicines as part of your treatment, do not stop taking medicines before you ask your health care provider if it is safe to stop. You may need to have the medicine slowly decreased (tapered) over time to lower the risk of harmful side effects. Relationships Many people who have PTSD have difficulty trusting others. Make an effort to:  Take risks and develop trust with close friends and family members. Developing trust in others can help you feel safe and connect you with emotional support.  Be open and honest about your feelings.  Have fun and relax in safe spaces, such as with friends and family.  Think about going to couples counseling, family education classes, or family therapy. Your loved ones may not always know how to be supportive. Therapy can be helpful for everyone. How to recognize changes in your condition Be aware of your   symptoms and how often you have them. The following symptoms mean that you need to seek help for your PTSD:  You feel suspicious and angry.  You have repeated flashbacks.  You avoid going out or being with others.  You have an increasing number of fights with close friends or family members, such as your spouse.  You have thoughts about  hurting yourself or others.  You cannot get relief from feelings of depression or anxiety. Follow these instructions at home: Lifestyle  Exercise regularly. Try to do 30 or more minutes of physical activity on most days of the week.  Try to get 7-9 hours of sleep each night. To help with sleep: ? Keep your bedroom cool and dark. ? Avoid screen time before bedtime. This means avoiding use of your TV, computer, tablet, and cell phone.  Practice self-soothing skills and use them daily.  Try to have fun and seek humor in your life. Eating and drinking  Do not eat a heavy meal during the hour before you go to bed.  Do not drink alcohol or caffeinated drinks before bed.  Avoid using alcohol or drugs. General instructions  If your PTSD is affecting your marriage or family, seek help from a family therapist.  Take over-the-counter and prescription medicines only as told by your health care provider.  Make sure to let all of your health care providers know that you have PTSD. This is especially important if you are having surgery or need to be admitted to the hospital.  Keep all follow-up visits as told by your health care providers. This is important. Where to find support Talking to others  Explain that PTSD is a mental health problem. It is something that a person can develop after experiencing or seeing a life-threatening event. Tell them that PTSD makes you feel stress like you did during the event.  Talk to your loved ones about the symptoms you have. Also tell them what things or situations can cause symptoms to start (are triggers for you).  Assure your loved ones that there are treatments to help PTSD. Discuss possibly seeking family therapy or couples therapy.  If you are worried or fearful about seeking treatment, ask for support.  Keep daily contact with at least one trusted friend or family member. Finances Not all insurance plans cover mental health care, so it is  important to check with your insurance carrier. If paying for co-pays or counseling services is a problem, search for a local or county mental health care center. Public mental health care services may be offered there at a low cost or no cost when you are not able to see a private health care provider. If you are a veteran, contact a local veterans organization or veterans hospital for more information. If you are taking medicine for PTSD, you may be able to get the genericform, which may be less expensive than brand-name medicine. Some makers of prescription medicines also offer help to patients who cannot afford the medicines that they need. Therapy and support groups  Find a support group in your community. Often, groups are available for military veterans, trauma victims, and family members or caregivers.  Look into volunteer opportunities. Taking part in these can help you feel more connected to your community.  Contact a local organization to find out if you are eligible for a service dog. Where to find more information Go to this website to find more information about PTSD, treatment of PTSD, and how to   get support:  National Center for PTSD: www.ptsd.va.gov Contact a health care provider if:  Your symptoms get worse or do not get better. Get help right away if:  You have thoughts about hurting yourself or others. If you ever feel like you may hurt yourself or others, or have thoughts about taking your own life, get help right away. You can go to your nearest emergency department or call:  Your local emergency services (911 in the [place]  A suicide crisis helpline, such as the National Suicide Prevention Lifeline at 1-800-273-8255. This is open 24-hours a day. Summary  If you are living with PTSD, there are ways to help you recover from it and manage your symptoms.  Find supportive environments and people who understand PTSD. Spend time in those places, and maintain contact with  those people.  Work with your health care team to create a plan for managing PTSD. The plan should include counseling, stress reduction techniques, and healthy lifestyle habits. This information is not intended to replace advice given to you by your health care provider. Make sure you discuss any questions you have with your health care provider. Document Released: 09/30/2016 Document Revised: 09/22/2018 Document Reviewed: 09/30/2016 Elsevier Patient Education  2020 [employer].  

## 2019-02-12 ENCOUNTER — Other Ambulatory Visit: Payer: Self-pay | Admitting: Nurse Practitioner

## 2019-02-12 NOTE — Telephone Encounter (Signed)
Requested medication (s) are due for refill today: yes  Requested medication (s) are on the active medication list:yes  Last refill:  01/17/2019 Future visit scheduled: yes  Notes to clinic: The original prescription was discontinued on 07/31/2018 by Marnee Guarneri T, NP for the following reason: Dose change. Renewing this prescription may not be appropriate   Requested Prescriptions  Pending Prescriptions Disp Refills   losartan (COZAAR) 50 MG tablet [Pharmacy Med Name: LOSARTAN POTASSIUM 50 MG TAB] 30 tablet 0    Sig: TAKE ONE TABLET BY MOUTH DAILY     Cardiovascular:  Angiotensin Receptor Blockers Failed - 02/12/2019  6:20 AM      Failed - Cr in normal range and within 180 days    Creatinine, Ser  Date Value Ref Range Status  10/12/2017 0.74 0.57 - 1.00 mg/dL Final         Failed - K in normal range and within 180 days    Potassium  Date Value Ref Range Status  10/12/2017 4.1 3.5 - 5.2 mmol/L Final         Failed - Last BP in normal range    BP Readings from Last 1 Encounters:  02/02/19 (!) 142/90         Passed - Patient is not pregnant      Passed - Valid encounter within last 6 months    Recent Outpatient Visits          1 week ago Essential hypertension   [place], Eldon T, NP   1 month ago Mild intermittent asthma with acute exacerbation   [facility] Volney American, PA-C   6 months ago Acute maxillary sinusitis, recurrence not specified   [facility] [place] [place], [place]   7 months ago Contact dermatitis, unspecified contact dermatitis type, unspecified trigger   [facility], Jolene T, NP   10 months ago Contact dermatitis, unspecified contact dermatitis type, unspecified trigger   [facility], Lilia Argue, PA-C      Future Appointments            In 3 weeks Cannady, Barbaraann Faster, NP [employer]

## 2019-02-12 NOTE — Telephone Encounter (Signed)
Patient is currently taking 100 mg.

## 2019-02-22 ENCOUNTER — Other Ambulatory Visit: Payer: Self-pay | Admitting: Nurse Practitioner

## 2019-02-22 NOTE — Telephone Encounter (Signed)
Forwarding medication review request to PCP for review. 

## 2019-03-06 ENCOUNTER — Ambulatory Visit: Payer: PRIVATE HEALTH INSURANCE | Admitting: Nurse Practitioner

## 2019-03-08 ENCOUNTER — Ambulatory Visit (INDEPENDENT_AMBULATORY_CARE_PROVIDER_SITE_OTHER): Payer: PRIVATE HEALTH INSURANCE | Admitting: Nurse Practitioner

## 2019-03-08 ENCOUNTER — Other Ambulatory Visit: Payer: Self-pay

## 2019-03-08 ENCOUNTER — Encounter: Payer: Self-pay | Admitting: Nurse Practitioner

## 2019-03-08 DIAGNOSIS — I10 Essential (primary) hypertension: Secondary | ICD-10-CM

## 2019-03-08 DIAGNOSIS — J0100 Acute maxillary sinusitis, unspecified: Secondary | ICD-10-CM

## 2019-03-08 MED ORDER — AMOXICILLIN-POT CLAVULANATE 875-125 MG PO TABS: 1.0000 | ORAL_TABLET | Freq: Two times a day (BID) | ORAL | 0 refills | Status: AC

## 2019-03-08 NOTE — Assessment & Plan Note (Signed)
Acute x 2 weeks.  Script sent for Augmentin.  Recommend not taking Tylenol Cold and Sinus due to BP and change to Coricidin. May take plain Tylenol for headache. Continue Flonase and neti pot at home + Zyrtec daily.  Increase rest and hydration.  Return to office for worsening or continued symptoms.

## 2019-03-08 NOTE — Patient Instructions (Signed)

## 2019-03-08 NOTE — Assessment & Plan Note (Signed)
Chronic, ongoing.  Continue Losartan 100 MG daily. Unsure effect of increase as patient has not been checking BP at home as recommended.  Have recommended she check BP at home daily and document for provider.  Return in 4 weeks for BP check and labs (in office).

## 2019-03-08 NOTE — Progress Notes (Signed)
Temp 97.9 F (36.6 C) (Oral)   LMP 02/25/2009 (Approximate)    Subjective:    Patient ID: Holly French, female    DOB: 1975/11/07, 43 y.o.   MRN: [id number]  HPI: Holly RousLaura M Stockman is a 43 y.o. female  Chief Complaint  Patient presents with  . Hypertension  . URI    pt states she has had congestion and sinus pressure for the past 2 weeks     . This visit was completed via Doximity due to the restrictions of the COVID-19 pandemic. All issues as above were discussed and addressed. Physical exam was done as above through visual confirmation on Doximity. If it was felt that the patient should be evaluated in the office, they were directed there. The patient verbally consented to this visit. . Location of the patient: home . Location of the provider: home . Those involved with this call:  . Provider: Aura DialsJolene Shenelle Klas, DNP . CMA: Wilhemena DurieBrittany Russell, CMA . Front Desk/Registration: Harriet PhoJoliza Johnson  . Time spent on call: 15 minutes with patient face to face via video conference. More than 50% of this time was spent in counseling and coordination of care. 10 minutes total spent in review of patient's record and preparation of their chart.  . I verified patient identity using two factors (patient name and date of birth). Patient consents verbally to being seen via telemedicine visit today.    HYPERTENSION Increased Losartan to 100 MG last visit, has not been checking BP at home as recommended. Hypertension status: stable  Satisfied with current treatment? yes Duration of hypertension: chronic BP monitoring frequency:  not checking BP range:  BP medication side effects:  no Medication compliance: good compliance Aspirin: no Recurrent headaches: no Visual changes: no Palpitations: no Dyspnea: no Chest pain: no Lower extremity edema: no Dizzy/lightheaded: no   UPPER RESPIRATORY TRACT INFECTION Symptoms present x 2 weeks.  Has history of three sinus surgeries and states she does not  get sinus infections as often as she used to , but still does get them.  Reports ongoing sinus pressure.  Has tried Tylenol Cold and Sinus, neti pot, and Flonase at home with no relief. Fever: no Cough: yes Shortness of breath: no Wheezing: no Chest pain: no Chest tightness: no Chest congestion: yes Nasal congestion: yes Runny nose: yes Post nasal drip: yes Sneezing: yes Sore throat: yes Swollen glands: no Sinus pressure: yes Headache: yes Face pain: yes Toothache: no Ear pain: none Ear pressure: none Eyes red/itching:no Eye drainage/crusting: no  Vomiting: no Rash: no Fatigue: yes Sick contacts: no Strep contacts: no  Context: stable Recurrent sinusitis: no Relief with OTC cold/cough medications: no  Treatments attempted: Tylenol Cold and Sinus, neti pot, and Flonase  Relevant past medical, surgical, family and social history reviewed and updated as indicated. Interim medical history since our last visit reviewed. Allergies and medications reviewed and updated.  Review of Systems  Constitutional: Positive for fatigue. Negative for activity change, appetite change and fever.  HENT: Positive for congestion, postnasal drip, rhinorrhea, sinus pressure, sinus pain, sneezing and sore throat. Negative for ear discharge, ear pain, facial swelling and voice change.   Eyes: Negative for pain and visual disturbance.  Respiratory: Positive for cough. Negative for chest tightness, shortness of breath and wheezing.   Cardiovascular: Negative for chest pain, palpitations and leg swelling.  Gastrointestinal: Negative for abdominal distention, abdominal pain, constipation, diarrhea, nausea and vomiting.  Endocrine: Negative.   Musculoskeletal: Negative for myalgias.  Neurological: Positive for  headaches. Negative for dizziness and numbness.  Psychiatric/Behavioral: Negative.     Per HPI unless specifically indicated above     Objective:    Temp 97.9 F (36.6 C) (Oral)   LMP  02/25/2009 (Approximate)   Wt Readings from Last 3 Encounters:  12/29/18 226 lb (102.5 kg)  08/03/18 220 lb 9 oz (100 kg)  06/28/18 221 lb (100.2 kg)    Physical Exam Vitals signs and nursing note reviewed.  Constitutional:      General: She is awake. She is not in acute distress.    Appearance: She is well-developed. She is not ill-appearing.  HENT:     Head: Normocephalic.     Right Ear: Hearing normal.     Left Ear: Hearing normal.     Nose:     Right Sinus: Maxillary sinus tenderness present. No frontal sinus tenderness.     Left Sinus: Maxillary sinus tenderness present. No frontal sinus tenderness.     Comments: Patient self palpated sinuses.    Mouth/Throat:     Pharynx: Posterior oropharyngeal erythema (mild with cobblestone appearance) present. No pharyngeal swelling or oropharyngeal exudate.     Comments: Viewed via virtual visit. Eyes:     General: Lids are normal.        Right eye: No discharge.        Left eye: No discharge.     Conjunctiva/sclera: Conjunctivae normal.  Neck:     Musculoskeletal: Normal range of motion.  Pulmonary:     Effort: Pulmonary effort is normal. No accessory muscle usage or respiratory distress.     Comments: Unable to auscultate due to virtual exam only.  No SOB with talking noted. Neurological:     Mental Status: She is alert and oriented to person, place, and time.  Psychiatric:        Attention and Perception: Attention normal.        Mood and Affect: Mood normal.        Behavior: Behavior normal. Behavior is cooperative.        Thought Content: Thought content normal.        Judgment: Judgment normal.     Results for orders placed or performed in visit on 11/15/17  Measles/Mumps/Rubella Immunity  Result Value Ref Range   Rubella Antibodies, IGG 1.38 Immune >0.99 index   RUBEOLA AB, IGG 152.0 Immune >29.9 AU/mL   MUMPS ABS, IGG 87.0 Immune >10.9 AU/mL  IGP, Aptima HPV, rfx 16/18,45  Result Value Ref Range   DIAGNOSIS:  Comment    Specimen adequacy: Comment    Clinician Provided ICD10 Comment    Performed by: Comment    PAP Smear Comment .    Note: Comment    Test Methodology Comment    HPV Aptima Negative Negative      Assessment & Plan:   Problem List Items Addressed This Visit      Cardiovascular and Mediastinum   Hypertension    Chronic, ongoing.  Continue Losartan 100 MG daily. Unsure effect of increase as patient has not been checking BP at home as recommended.  Have recommended she check BP at home daily and document for provider.  Return in 4 weeks for BP check and labs (in office).        Respiratory   Maxillary sinusitis, acute    Acute x 2 weeks.  Script sent for Augmentin.  Recommend not taking Tylenol Cold and Sinus due to BP and change to Coricidin. May take plain Tylenol for  headache. Continue Flonase and neti pot at home + Zyrtec daily.  Increase rest and hydration.  Return to office for worsening or continued symptoms.      Relevant Medications   amoxicillin-clavulanate (AUGMENTIN) 875-125 MG tablet       Follow up plan: Return in about 4 weeks (around 04/05/2019) for HTN in office.

## 2019-04-25 ENCOUNTER — Ambulatory Visit: Payer: PRIVATE HEALTH INSURANCE | Admitting: Family Medicine

## 2019-04-25 ENCOUNTER — Encounter: Payer: Self-pay | Admitting: Family Medicine

## 2019-04-25 ENCOUNTER — Ambulatory Visit (INDEPENDENT_AMBULATORY_CARE_PROVIDER_SITE_OTHER): Payer: PRIVATE HEALTH INSURANCE | Admitting: Family Medicine

## 2019-04-25 ENCOUNTER — Other Ambulatory Visit: Payer: Self-pay

## 2019-04-25 VITALS — BP 126/85 | HR 102 | Temp 98.2°F | Ht 62.0 in | Wt 229.0 lb

## 2019-04-25 DIAGNOSIS — L259 Unspecified contact dermatitis, unspecified cause: Secondary | ICD-10-CM | POA: Diagnosis not present

## 2019-04-25 MED ORDER — TRIAMCINOLONE ACETONIDE 0.1 % EX CREA: 1.0000 "application " | TOPICAL_CREAM | Freq: Two times a day (BID) | CUTANEOUS | 0 refills | Status: DC

## 2019-04-25 NOTE — Progress Notes (Signed)
BP 126/85   Pulse (!) 102   Temp 98.2 F (36.8 C) (Oral)   Ht 5\' 2"  (1.575 m)   Wt 229 lb (103.9 kg)   LMP 02/25/2009 (Approximate)   SpO2 97%   BMI 41.88 kg/m    Subjective:    Patient ID: Holly French, female    DOB: [birth year], 43 y.o.   MRN: [id number]  HPI: Holly French is a 43 y.o. female  Chief Complaint  Patient presents with  . Rash    bilateral arms and hands. burning and itching. since last week.   Over a week of itching, burning rash on b/l hands and forearms and now hands are swelling a bit. Just the past day or so noticing some of the bumps on back of neck as well. Had the same issue last fall and notes she let it go longer and it got much worse. Using hydrocortisone ointment and takes zyrtec daily without much relief. No new exposures, recent travel, and uses unscented products at home due to known eczema and allergies.   Relevant past medical, surgical, family and social history reviewed and updated as indicated. Interim medical history since our last visit reviewed. Allergies and medications reviewed and updated.  Review of Systems  Per HPI unless specifically indicated above     Objective:    BP 126/85   Pulse (!) 102   Temp 98.2 F (36.8 C) (Oral)   Ht 5\' 2"  (1.575 m)   Wt 229 lb (103.9 kg)   LMP 02/25/2009 (Approximate)   SpO2 97%   BMI 41.88 kg/m   Wt Readings from Last 3 Encounters:  04/25/19 229 lb (103.9 kg)  12/29/18 226 lb (102.5 kg)  08/03/18 220 lb 9 oz (100 kg)    Physical Exam Vitals signs and nursing note reviewed.  Constitutional:      Appearance: Normal appearance. She is not ill-appearing.  HENT:     Head: Atraumatic.  Eyes:     Extraocular Movements: Extraocular movements intact.     Conjunctiva/sclera: Conjunctivae normal.  Neck:     Musculoskeletal: Normal range of motion and neck supple.  Cardiovascular:     Rate and Rhythm: Normal rate and regular rhythm.     Heart sounds: Normal heart sounds.  Pulmonary:      Effort: Pulmonary effort is normal.     Breath sounds: Normal breath sounds.  Musculoskeletal: Normal range of motion.  Skin:    General: Skin is warm.     Findings: Rash (erythematous papular rash diffusely across b/l hands and forearms and small area on posterior neck) present.  Neurological:     Mental Status: She is alert and oriented to person, place, and time.  Psychiatric:        Mood and Affect: Mood normal.        Thought Content: Thought content normal.        Judgment: Judgment normal.     Results for orders placed or performed in visit on 11/15/17  Measles/Mumps/Rubella Immunity  Result Value Ref Range   Rubella Antibodies, IGG 1.38 Immune >0.99 index   RUBEOLA AB, IGG 152.0 Immune >29.9 AU/mL   MUMPS ABS, IGG 87.0 Immune >10.9 AU/mL  IGP, Aptima HPV, rfx 16/18,45  Result Value Ref Range   DIAGNOSIS: Comment    Specimen adequacy: Comment    Clinician Provided ICD10 Comment    Performed by: Comment    PAP Smear Comment .    Note: Comment  Test Methodology Comment    HPV Aptima Negative Negative      Assessment & Plan:   Problem List Items Addressed This Visit      Musculoskeletal and Integument   Contact dermatitis - Primary    Unclear trigger, treat with triamcinolone cream, antihistamine, and avoid any potential allergens. F/u if not improving, may need to escalate to prednisone as she did require that last time this happened          Follow up plan: Return if symptoms worsen or fail to improve.

## 2019-04-25 NOTE — Assessment & Plan Note (Addendum)
Unclear trigger, treat with triamcinolone cream, antihistamine, and avoid any potential allergens. F/u if not improving, may need to escalate to prednisone as she did require that last time this happened

## 2019-07-06 ENCOUNTER — Other Ambulatory Visit: Payer: Self-pay | Admitting: Family Medicine

## 2019-09-09 ENCOUNTER — Other Ambulatory Visit: Payer: Self-pay | Admitting: Family Medicine

## 2019-09-10 MED ORDER — TRIAMCINOLONE ACETONIDE 0.1 % EX CREA: TOPICAL_CREAM | Freq: Two times a day (BID) | CUTANEOUS | 0 refills | Status: DC | PRN

## 2019-11-15 ENCOUNTER — Encounter: Payer: Self-pay | Admitting: Family Medicine

## 2019-11-15 ENCOUNTER — Telehealth (INDEPENDENT_AMBULATORY_CARE_PROVIDER_SITE_OTHER): Payer: PRIVATE HEALTH INSURANCE | Admitting: Family Medicine

## 2019-11-15 VITALS — Wt 225.0 lb

## 2019-11-15 DIAGNOSIS — J0100 Acute maxillary sinusitis, unspecified: Secondary | ICD-10-CM

## 2019-11-15 DIAGNOSIS — M961 Postlaminectomy syndrome, not elsewhere classified: Secondary | ICD-10-CM

## 2019-11-15 MED ORDER — AMOXICILLIN-POT CLAVULANATE 875-125 MG PO TABS: 1.0000 | ORAL_TABLET | Freq: Two times a day (BID) | ORAL | 0 refills | Status: DC

## 2019-11-15 MED ORDER — HYDROCOD POLST-CPM POLST ER 10-8 MG/5ML PO SUER: 5.0000 mL | Freq: Every evening | ORAL | 0 refills | Status: DC | PRN

## 2019-11-15 NOTE — Assessment & Plan Note (Signed)
Tx with augmentin, tussionex, mucinex and continued allergy regimen.

## 2019-11-15 NOTE — Progress Notes (Signed)
Wt 225 lb (102.1 kg)   LMP 02/25/2009 (Approximate)   BMI 41.15 kg/m    Subjective:    Patient ID: Holly French, female    DOB: September 29, 1975, 44 y.o.   MRN: [id number]  HPI: Holly French is a 44 y.o. female  Chief Complaint  Patient presents with  . Cough    x over a week. has tried OTC cold medication  . Headache  . Sinusitis    . This visit was completed via MyChart due to the restrictions of the COVID-19 pandemic. All issues as above were discussed and addressed. Physical exam was done as above through visual confirmation on MyChart. If it was felt that the patient should be evaluated in the office, they were directed there. The patient verbally consented to this visit. . Location of the patient: home . Location of the provider: work . Those involved with this call:  . Provider: Roosvelt Maser, PA-C . CMA: Elton Sin, CMA . Front Desk/Registration: Harriet Pho  . Time spent on call: 15 minutes with patient face to face via video conference. More than 50% of this time was spent in counseling and coordination of care. 5 minutes total spent in review of patient's record and preparation of their chart. I verified patient identity using two factors (patient name and date of birth). Patient consents verbally to being seen via telemedicine visit today.   Started over a week ago with nasal congestion, sinus pain and pressure, headache, sore throat, productive cough worst at night. Denies fever, chills, wheezing, CP, SOB, sick contacts, recent travel. Taking Tylenol cold and sinus, mucinex, sinus rinses, allergy regimen wtihout benefit.   Got new insurance and needs a referral to continue seeing her Neurosurgeon/Pain specialist at [facility]. She is followed there for post-laminectomy syndrome and chronic pain issues.   Relevant past medical, surgical, family and social history reviewed and updated as indicated. Interim medical history since our last visit  reviewed. Allergies and medications reviewed and updated.  Review of Systems  Per HPI unless specifically indicated above     Objective:    Wt 225 lb (102.1 kg)   LMP 02/25/2009 (Approximate)   BMI 41.15 kg/m   Wt Readings from Last 3 Encounters:  11/15/19 225 lb (102.1 kg)  04/25/19 229 lb (103.9 kg)  12/29/18 226 lb (102.5 kg)    Physical Exam Vitals and nursing note reviewed.  Constitutional:      General: She is not in acute distress.    Appearance: Normal appearance.  HENT:     Head: Atraumatic.     Right Ear: External ear normal.     Left Ear: External ear normal.     Nose: Congestion present.     Mouth/Throat:     Mouth: Mucous membranes are moist.     Pharynx: Oropharynx is clear. Posterior oropharyngeal erythema present.  Eyes:     Extraocular Movements: Extraocular movements intact.     Conjunctiva/sclera: Conjunctivae normal.  Cardiovascular:     Comments: Unable to assess via virtual visit Pulmonary:     Effort: Pulmonary effort is normal. No respiratory distress.  Musculoskeletal:        General: Normal range of motion.     Cervical back: Normal range of motion.  Skin:    General: Skin is dry.     Findings: No erythema.  Neurological:     Mental Status: She is alert and oriented to person, place, and time.  Psychiatric:  Mood and Affect: Mood normal.        Thought Content: Thought content normal.        Judgment: Judgment normal.     Results for orders placed or performed in visit on 11/15/17  Measles/Mumps/Rubella Immunity  Result Value Ref Range   Rubella Antibodies, IGG 1.38 Immune >0.99 index   RUBEOLA AB, IGG 152.0 Immune >29.9 AU/mL   MUMPS ABS, IGG 87.0 Immune >10.9 AU/mL  IGP, Aptima HPV, rfx 16/18,45  Result Value Ref Range   DIAGNOSIS: Comment    Specimen adequacy: Comment    Clinician Provided ICD10 Comment    Performed by: Comment    PAP Smear Comment .    Note: Comment    Test Methodology Comment    HPV Aptima  Negative Negative      Assessment & Plan:   Problem List Items Addressed This Visit      Respiratory   Maxillary sinusitis, acute - Primary    Tx with augmentin, tussionex, mucinex and continued allergy regimen.       Relevant Medications   amoxicillin-clavulanate (AUGMENTIN) 875-125 MG tablet   chlorpheniramine-HYDROcodone (TUSSIONEX PENNKINETIC ER) 10-8 MG/5ML SUER    Other Visit Diagnoses    Post laminectomy syndrome       Relevant Orders   Ambulatory referral to Pain Clinic       Follow up plan: Return if symptoms worsen or fail to improve.

## 2019-11-19 ENCOUNTER — Telehealth: Payer: PRIVATE HEALTH INSURANCE | Admitting: Family Medicine

## 2019-12-15 ENCOUNTER — Telehealth: Payer: Medicaid Other | Admitting: Family

## 2019-12-15 DIAGNOSIS — L259 Unspecified contact dermatitis, unspecified cause: Secondary | ICD-10-CM

## 2019-12-15 MED ORDER — TRIAMCINOLONE ACETONIDE 0.1 % EX CREA
1.0000 | TOPICAL_CREAM | Freq: Two times a day (BID) | CUTANEOUS | 0 refills | Status: DC
Start: 2019-12-15 — End: 2020-01-24

## 2019-12-15 MED ORDER — PREDNISONE 10 MG PO TABS
10.0000 mg | ORAL_TABLET | Freq: Every day | ORAL | 0 refills | Status: DC
Start: 2019-12-15 — End: 2020-01-24

## 2019-12-15 NOTE — Progress Notes (Signed)
E Visit for Rash  We are sorry that you are not feeling well. Here is how we plan to help!  Based on what you shared with me it looks like you have contact dermatitis.  Contact dermatitis is a skin rash caused by something that touches the skin and causes irritation or inflammation.  Your skin may be red, swollen, dry, cracked, and itch.  The rash should go away in a few days but can last a few weeks.  If you get a rash, it's important to figure out what caused it so the irritant can be avoided in the future. and I have prescribed Prednisone 10 mg daily for 5 days and kenalog cream.        HOME CARE:   Take cool showers and avoid direct sunlight.  Apply cool compress or wet dressings.  Take a bath in an oatmeal bath.  Sprinkle content of one Aveeno packet under running faucet with comfortably warm water.  Bathe for 15-20 minutes, 1-2 times daily.  Pat dry with a towel. Do not rub the rash.  Use hydrocortisone cream.  Take an antihistamine like Benadryl for widespread rashes that itch.  The adult dose of Benadryl is 25-50 mg by mouth 4 times daily.  Caution:  This type of medication may cause sleepiness.  Do not drink alcohol, drive, or operate dangerous machinery while taking antihistamines.  Do not take these medications if you have prostate enlargement.  Read package instructions thoroughly on all medications that you take.  GET HELP RIGHT AWAY IF:   Symptoms don't go away after treatment.  Severe itching that persists.  If you rash spreads or swells.  If you rash begins to smell.  If it blisters and opens or develops a yellow-brown crust.  You develop a fever.  You have a sore throat.  You become short of breath.  MAKE SURE YOU:  Understand these instructions. Will watch your condition. Will get help right away if you are not doing well or get worse.  Thank you for choosing an e-visit. Your e-visit answers were reviewed by a board certified advanced clinical  practitioner to complete your personal care plan. Depending upon the condition, your plan could have included both over the counter or prescription medications. Please review your pharmacy choice. Be sure that the pharmacy you have chosen is open so that you can pick up your prescription now.  If there is a problem you may message your provider in MyChart to have the prescription routed to another pharmacy. Your safety is important to [place]. If you have drug allergies check your prescription carefully.  For the next 24 hours, you can use MyChart to ask questions about today's visit, request a non-urgent call back, or ask for a work or school excuse from your e-visit provider. You will get an email in the next two days asking about your experience. I hope that your e-visit has been valuable and will speed your recovery.   Approximately 5 minutes was spent documenting and reviewing patient's chart.

## 2020-01-14 ENCOUNTER — Telehealth: Payer: Medicaid Other | Admitting: Physician Assistant

## 2020-01-14 DIAGNOSIS — N898 Other specified noninflammatory disorders of vagina: Secondary | ICD-10-CM

## 2020-01-14 MED ORDER — FLUCONAZOLE 150 MG PO TABS
150.0000 mg | ORAL_TABLET | Freq: Once | ORAL | 0 refills | Status: AC
Start: 2020-01-14 — End: 2020-01-14

## 2020-01-14 MED ORDER — NYSTATIN 100000 UNIT/GM EX CREA
1.0000 | TOPICAL_CREAM | Freq: Two times a day (BID) | CUTANEOUS | 0 refills | Status: AC
Start: 2020-01-14 — End: 2020-01-21

## 2020-01-14 NOTE — Progress Notes (Signed)
We are sorry that you are not feeling well. Here is how we plan to help! Based on what you shared with me it looks like you: May have a yeast vaginosis  Vaginosis is an inflammation of the vagina that can result in discharge, itching and pain. The cause is usually a change in the normal balance of vaginal bacteria or an infection. Vaginosis can also result from reduced estrogen levels after menopause.  The most common causes of vaginosis are:   Bacterial vaginosis which results from an overgrowth of one on several organisms that are normally present in your vagina.   Yeast infections which are caused by a naturally occurring fungus called candida.   Vaginal atrophy (atrophic vaginosis) which results from the thinning of the vagina from reduced estrogen levels after menopause.   Trichomoniasis which is caused by a parasite and is commonly transmitted by sexual intercourse.  Factors that increase your risk of developing vaginosis include: Marland Kitchen Medications, such as antibiotics and steroids . Uncontrolled diabetes . Use of hygiene products such as bubble bath, vaginal spray or vaginal deodorant . Douching . Wearing damp or tight-fitting clothing . Using an intrauterine device (IUD) for birth control . Hormonal changes, such as those associated with pregnancy, birth control pills or menopause . Sexual activity . Having a sexually transmitted infection  Your treatment plan is A single Diflucan (fluconazole) 150mg  tablet once.  I have electronically sent this prescription into the pharmacy that you have chosen. I have also sent a prescription for Nystatin cream.  Be sure to take all of the medication as directed. Stop taking any medication if you develop a rash, tongue swelling or shortness of breath. Mothers who are breast feeding should consider pumping and discarding their breast milk while on these antibiotics. However, there is no consensus that infant exposure at these doses would be harmful.   Remember that medication creams can weaken latex condoms.   HOME CARE:  Good hygiene may prevent some types of vaginosis from recurring and may relieve some symptoms:  . Avoid baths, hot tubs and whirlpool spas. Rinse soap from your outer genital area after a shower, and dry the area well to prevent irritation. Don't use scented or harsh soaps, such as those with deodorant or antibacterial action. Marland Kitchen Avoid irritants. These include scented tampons and pads. . Wipe from front to back after using the toilet. Doing so avoids spreading fecal bacteria to your vagina.  Other things that may help prevent vaginosis include:  Marland Kitchen Don't douche. Your vagina doesn't require cleansing other than normal bathing. Repetitive douching disrupts the normal organisms that reside in the vagina and can actually increase your risk of vaginal infection. Douching won't clear up a vaginal infection. . Use a latex condom. Both female and female latex condoms may help you avoid infections spread by sexual contact. . Wear cotton underwear. Also wear pantyhose with a cotton crotch. If you feel comfortable without it, skip wearing underwear to bed. Yeast thrives in Marland Kitchen Your symptoms should improve in the next day or two.  GET HELP RIGHT AWAY IF:  . You have pain in your lower abdomen ( pelvic area or over your ovaries) . You develop nausea or vomiting . You develop a fever . Your discharge changes or worsens . You have persistent pain with intercourse . You develop shortness of breath, a rapid pulse, or you faint.  These symptoms could be signs of problems or infections that need to be evaluated by a  medical provider now.  MAKE SURE YOU    Understand these instructions.  Will watch your condition.  Will get help right away if you are not doing well or get worse.  Your e-visit answers were reviewed by a board certified advanced clinical practitioner to complete your personal care plan. Depending  upon the condition, your plan could have included both over the counter or prescription medications. Please review your pharmacy choice to make sure that you have choses a pharmacy that is open for you to pick up any needed prescription, Your safety is important to [place]. If you have drug allergies check your prescription carefully.   You can use MyChart to ask questions about today's visit, request a non-urgent call back, or ask for a work or school excuse for 24 hours related to this e-Visit. If it has been greater than 24 hours you will need to follow up with your provider, or enter a new e-Visit to address those concerns. You will get a MyChart message within the next two days asking about your experience. I hope that your e-visit has been valuable and will speed your recovery.  Approximately 5 minutes was spent documenting and reviewing patient's chart.

## 2020-01-21 ENCOUNTER — Other Ambulatory Visit: Payer: Self-pay | Admitting: Nurse Practitioner

## 2020-01-24 ENCOUNTER — Encounter: Payer: Self-pay | Admitting: Family Medicine

## 2020-01-24 ENCOUNTER — Ambulatory Visit (INDEPENDENT_AMBULATORY_CARE_PROVIDER_SITE_OTHER): Payer: PRIVATE HEALTH INSURANCE | Admitting: Family Medicine

## 2020-01-24 ENCOUNTER — Other Ambulatory Visit: Payer: Self-pay

## 2020-01-24 VITALS — BP 141/81 | HR 117 | Temp 98.4°F | Wt 226.0 lb

## 2020-01-24 DIAGNOSIS — R21 Rash and other nonspecific skin eruption: Secondary | ICD-10-CM

## 2020-01-24 MED ORDER — PREDNISONE 10 MG PO TABS
ORAL_TABLET | ORAL | 0 refills | Status: DC
Start: 2020-01-24 — End: 2020-03-07

## 2020-01-24 NOTE — Progress Notes (Signed)
BP (!) 141/81    Pulse (!) 117    Temp 98.4 F (36.9 C) (Oral)    Wt 226 lb (102.5 kg)    LMP 02/25/2009 (Approximate)    SpO2 97%    BMI 41.34 kg/m    Subjective:    Patient ID: Holly French, female    DOB: Feb 11, 1976, 44 y.o.   MRN: [id number]  HPI: Holly French is a 44 y.o. female  Chief Complaint  Patient presents with   Rash    all over x over 2 weeks   Here today with persistent diffuse itchy rash on all 4 extremities and trunk x 2 weeks. No new medications, food changes, recent insect bites or other exposures. On chronic zyrtec, now also taking benadryl which isn't seeming to help much. Denies associated CP, SOB, wheezing, fevers, or other new sxs. Hx of allergic rhinitis and hives reactions.   Relevant past medical, surgical, family and social history reviewed and updated as indicated. Interim medical history since our last visit reviewed. Allergies and medications reviewed and updated.  Review of Systems  Per HPI unless specifically indicated above     Objective:    BP (!) 141/81    Pulse (!) 117    Temp 98.4 F (36.9 C) (Oral)    Wt 226 lb (102.5 kg)    LMP 02/25/2009 (Approximate)    SpO2 97%    BMI 41.34 kg/m   Wt Readings from Last 3 Encounters:  01/24/20 226 lb (102.5 kg)  11/15/19 225 lb (102.1 kg)  04/25/19 229 lb (103.9 kg)    Physical Exam Vitals and nursing note reviewed.  Constitutional:      Appearance: Normal appearance. She is not ill-appearing.  HENT:     Head: Atraumatic.  Eyes:     Extraocular Movements: Extraocular movements intact.     Conjunctiva/sclera: Conjunctivae normal.  Cardiovascular:     Rate and Rhythm: Normal rate and regular rhythm.     Heart sounds: Normal heart sounds.  Pulmonary:     Effort: Pulmonary effort is normal.     Breath sounds: Normal breath sounds.  Musculoskeletal:        General: Normal range of motion.     Cervical back: Normal range of motion and neck supple.  Skin:    General: Skin is warm and  dry.     Findings: Rash (diffuse erythematous papules across all 4 extremities and trunk) present.  Neurological:     Mental Status: She is alert and oriented to person, place, and time.  Psychiatric:        Mood and Affect: Mood normal.        Thought Content: Thought content normal.        Judgment: Judgment normal.     Results for orders placed or performed in visit on 11/15/17  Measles/Mumps/Rubella Immunity  Result Value Ref Range   Rubella Antibodies, IGG 1.38 Immune >0.99 index   RUBEOLA AB, IGG 152.0 Immune >29.9 AU/mL   MUMPS ABS, IGG 87.0 Immune >10.9 AU/mL  IGP, Aptima HPV, rfx 16/18,45  Result Value Ref Range   DIAGNOSIS: Comment    Specimen adequacy: Comment    Clinician Provided ICD10 Comment    Performed by: Comment    PAP Smear Comment .    Note: Comment    Test Methodology Comment    HPV Aptima Negative Negative      Assessment & Plan:   Problem List Items Addressed This Visit  None    Visit Diagnoses    Rash    -  Primary   Suspect allergic component, tx with extended prednisone taper, continued antihistamines. Will refer to Allergist if recurring       Follow up plan: Return if symptoms worsen or fail to improve.

## 2020-02-28 ENCOUNTER — Telehealth: Payer: Medicaid Other | Admitting: Physician Assistant

## 2020-02-28 DIAGNOSIS — R21 Rash and other nonspecific skin eruption: Secondary | ICD-10-CM

## 2020-02-28 NOTE — Progress Notes (Signed)
Based on what you shared with me, I feel your condition warrants further evaluation and I recommend that you be seen for a face to face office visit.  It looks like you've had treatment for a rash virtually in July and in person in August. With recurrent rashes, I would highly recommend in person evaluation to better determine the cause of the rash, perform the necessary testing if indicated, and find the appropriate treatment as it seems prior treatments have not resolved the rash entirely. You can follow up with your PCP, a dermatologist, or urgent care for further evaluation.     NOTE: If you entered your credit card information for this eVisit, you will not be charged. You may see a "hold" on your card for the $35 but that hold will drop off and you will not have a charge processed.   If you are having a true medical emergency please call 911.      For an urgent face to face visit, [facility] has five urgent care centers for your convenience:      NEW:  [facility] at [facility] Directions [phone] [address] [place], [place] [address] . 10 am - 6pm Monday - Friday    [facility]) Get Driving Directions [phone] [address] [place], [place] [address] . 10 am to 8 pm Monday-Friday . 12 pm to 8 pm [facility] Care at [facility] Get Driving Directions [phone] [address], [address] [place], [place] [address] . 8 am to 8 pm Monday-Friday . 9 am to 6 pm Saturday . 11 am to 6 pm Sunday     [facility] at [facility] Get Driving Directions  [phone] [address] [place], [place] [address] . 8 am to 8 pm Monday-Friday . 8 am to 4 pm [facility] at [facility] Directions [phone] [address] [place], [place] [address] . 12 pm to 6 pm Monday-Friday       Your e-visit answers were reviewed by a board certified advanced clinical practitioner to complete your personal care plan.  Thank you for using e-Visits.   Greater than 5 minutes, yet less than 10 minutes of time have been spent researching, coordinating, and implementing care for this patient today.

## 2020-03-04 ENCOUNTER — Ambulatory Visit: Payer: PRIVATE HEALTH INSURANCE | Admitting: Family Medicine

## 2020-03-07 ENCOUNTER — Encounter: Payer: Self-pay | Admitting: Family Medicine

## 2020-03-07 ENCOUNTER — Ambulatory Visit (INDEPENDENT_AMBULATORY_CARE_PROVIDER_SITE_OTHER): Payer: PRIVATE HEALTH INSURANCE | Admitting: Family Medicine

## 2020-03-07 ENCOUNTER — Other Ambulatory Visit: Payer: Self-pay

## 2020-03-07 VITALS — BP 121/82 | HR 106 | Temp 98.2°F | Wt 227.0 lb

## 2020-03-07 DIAGNOSIS — T7840XA Allergy, unspecified, initial encounter: Secondary | ICD-10-CM | POA: Diagnosis not present

## 2020-03-07 MED ORDER — TRIAMCINOLONE ACETONIDE 40 MG/ML IJ SUSP
80.0000 mg | Freq: Once | INTRAMUSCULAR | Status: AC
  Administered 2020-03-07: 80 mg via INTRAMUSCULAR

## 2020-03-07 MED ORDER — PREDNISONE 10 MG PO TABS: ORAL_TABLET | ORAL | 0 refills | Status: DC

## 2020-03-07 NOTE — Progress Notes (Signed)
BP 121/82   Pulse (!) 106   Temp 98.2 F (36.8 C) (Oral)   Wt 227 lb (103 kg)   LMP 02/25/2009 (Approximate)   SpO2 94%   BMI 41.52 kg/m    Subjective:    Patient ID: Holly French, female    DOB: 1975-11-23, 44 y.o.   MRN: [id number]  HPI: Holly French is a 44 y.o. female  Chief Complaint  Patient presents with  . Rash    pt states has developed some rash on parts of her body the night she got the covid vaccine 02/21/20   RASH- last rash went away, but started with rash the night of her COVID  Duration:  2.5 weeks  Location: generalized  Itching: yes Burning: yes Redness: yes Oozing: no Scaling: no Blisters: yes Painful: yes Fevers: no Change in detergents/soaps/personal care products: no Recent illness: no Recent travel:no History of same: yes Context: worse Alleviating factors: hydrocortisone cream and benadryl Treatments attempted:hydrocortisone cream and benadryl Shortness of breath: no  Throat/tongue swelling: no Myalgias/arthralgias: no  Relevant past medical, surgical, family and social history reviewed and updated as indicated. Interim medical history since our last visit reviewed. Allergies and medications reviewed and updated.  Review of Systems  Constitutional: Negative.   Respiratory: Negative.   Cardiovascular: Negative.   Gastrointestinal: Negative.   Musculoskeletal: Negative.   Skin: Positive for rash. Negative for color change, pallor and wound.  Neurological: Negative.   Psychiatric/Behavioral: Negative.     Per HPI unless specifically indicated above     Objective:    BP 121/82   Pulse (!) 106   Temp 98.2 F (36.8 C) (Oral)   Wt 227 lb (103 kg)   LMP 02/25/2009 (Approximate)   SpO2 94%   BMI 41.52 kg/m   Wt Readings from Last 3 Encounters:  03/07/20 227 lb (103 kg)  01/24/20 226 lb (102.5 kg)  11/15/19 225 lb (102.1 kg)    Physical Exam Vitals and nursing note reviewed.  Constitutional:      General: She is not  in acute distress.    Appearance: Normal appearance. She is not ill-appearing, toxic-appearing or diaphoretic.  HENT:     Head: Normocephalic and atraumatic.     Right Ear: External ear normal.     Left Ear: External ear normal.     Nose: Nose normal.     Mouth/Throat:     Mouth: Mucous membranes are moist.     Pharynx: Oropharynx is clear.  Eyes:     General: No scleral icterus.       Right eye: No discharge.        Left eye: No discharge.     Extraocular Movements: Extraocular movements intact.     Conjunctiva/sclera: Conjunctivae normal.     Pupils: Pupils are equal, round, and reactive to light.  Cardiovascular:     Rate and Rhythm: Normal rate and regular rhythm.     Pulses: Normal pulses.     Heart sounds: Normal heart sounds. No murmur heard.  No friction rub. No gallop.   Pulmonary:     Effort: Pulmonary effort is normal. No respiratory distress.     Breath sounds: Normal breath sounds. No stridor. No wheezing, rhonchi or rales.  Chest:     Chest wall: No tenderness.  Musculoskeletal:        General: Normal range of motion.     Cervical back: Normal range of motion and neck supple.  Skin:  General: Skin is warm and dry.     Capillary Refill: Capillary refill takes less than 2 seconds.     Coloration: Skin is not jaundiced or pale.     Findings: Erythema and rash present. No bruising or lesion.     Comments: Erythematous raised patches on arms, chest, back  Neurological:     General: No focal deficit present.     Mental Status: She is alert and oriented to person, place, and time. Mental status is at baseline.  Psychiatric:        Mood and Affect: Mood normal.        Behavior: Behavior normal.        Thought Content: Thought content normal.        Judgment: Judgment normal.     Results for orders placed or performed in visit on 11/15/17  Measles/Mumps/Rubella Immunity  Result Value Ref Range   Rubella Antibodies, IGG 1.38 Immune >0.99 index   RUBEOLA AB,  IGG 152.0 Immune >29.9 AU/mL   MUMPS ABS, IGG 87.0 Immune >10.9 AU/mL  IGP, Aptima HPV, rfx 16/18,45  Result Value Ref Range   DIAGNOSIS: Comment    Specimen adequacy: Comment    Clinician Provided ICD10 Comment    Performed by: Comment    PAP Smear Comment .    Note: Comment    Test Methodology Comment    HPV Aptima Negative Negative      Assessment & Plan:   Problem List Items Addressed This Visit    None    Visit Diagnoses    Allergic reaction, initial encounter    -  Primary   Triamcinalone today, start prednisone taper. Continue zyrtec and benadryl. Add pepcid. Hold on 2nd COVID vaccine until evaluated by Allergist. Referral placed.    Relevant Medications   triamcinolone acetonide (KENALOG-40) injection 80 mg (Start on 03/07/2020  1:30 PM)   Other Relevant Orders   Ambulatory referral to Allergy       Follow up plan: Return in about 2 weeks (around 03/21/2020) for regular follow up on mood and BP with Jolene.

## 2020-03-11 ENCOUNTER — Other Ambulatory Visit: Payer: Self-pay | Admitting: Family Medicine

## 2020-03-11 ENCOUNTER — Encounter: Payer: Self-pay | Admitting: Family Medicine

## 2020-03-11 MED ORDER — TRIAMCINOLONE ACETONIDE 0.5 % EX OINT
1.0000 | TOPICAL_OINTMENT | Freq: Two times a day (BID) | CUTANEOUS | 2 refills | Status: DC
Start: 2020-03-11 — End: 2020-04-09

## 2020-03-25 ENCOUNTER — Telehealth: Payer: Self-pay

## 2020-03-25 NOTE — Telephone Encounter (Signed)
Pt also stated she doesn't know if she needs another apt for rash or can get a Refill on medication for rash.

## 2020-03-25 NOTE — Telephone Encounter (Signed)
Routing to referral coordinator for referral and provider to advise on medication.

## 2020-03-25 NOTE — Telephone Encounter (Signed)
Pt stated the referral that was made to [facility] needs to be changed to [facility] due to reaction being from covid vaccine she would have to go there instead.

## 2020-03-26 NOTE — Telephone Encounter (Signed)
Called and LVM for patient letting her know that referral was redirected to [facility] Also advised patient about medications. Asked for patient to please give [place] a call back if she needs to schedule an appointment for more Prednisone. DPR was verified.

## 2020-03-26 NOTE — Telephone Encounter (Signed)
What medicine is she talking about- she'd need to be seen for prednisone, and she shouldn't be out of the kenalog cream yet.

## 2020-03-26 NOTE — Telephone Encounter (Signed)
Referral sent to [facility]

## 2020-04-09 ENCOUNTER — Other Ambulatory Visit: Payer: Self-pay

## 2020-04-09 ENCOUNTER — Encounter: Payer: Self-pay | Admitting: Nurse Practitioner

## 2020-04-09 ENCOUNTER — Ambulatory Visit (INDEPENDENT_AMBULATORY_CARE_PROVIDER_SITE_OTHER): Payer: No Typology Code available for payment source | Admitting: Nurse Practitioner

## 2020-04-09 VITALS — BP 121/82 | HR 94 | Temp 97.8°F | Resp 16 | Ht 62.0 in | Wt 221.0 lb

## 2020-04-09 DIAGNOSIS — R21 Rash and other nonspecific skin eruption: Secondary | ICD-10-CM | POA: Insufficient documentation

## 2020-04-09 DIAGNOSIS — Z23 Encounter for immunization: Secondary | ICD-10-CM | POA: Diagnosis not present

## 2020-04-09 DIAGNOSIS — I10 Essential (primary) hypertension: Secondary | ICD-10-CM | POA: Diagnosis not present

## 2020-04-09 DIAGNOSIS — F17210 Nicotine dependence, cigarettes, uncomplicated: Secondary | ICD-10-CM

## 2020-04-09 DIAGNOSIS — Z6841 Body Mass Index (BMI) 40.0 and over, adult: Secondary | ICD-10-CM

## 2020-04-09 MED ORDER — PREDNISONE 10 MG PO TABS: ORAL_TABLET | ORAL | 0 refills | Status: DC

## 2020-04-09 MED ORDER — HYDROXYZINE HCL 10 MG PO TABS: 10.0000 mg | ORAL_TABLET | Freq: Three times a day (TID) | ORAL | 0 refills | Status: DC | PRN

## 2020-04-09 MED ORDER — LOSARTAN POTASSIUM 100 MG PO TABS
100.0000 mg | ORAL_TABLET | Freq: Every day | ORAL | 4 refills | Status: DC
Start: 2020-04-09 — End: 2020-08-08

## 2020-04-09 MED ORDER — TRIAMCINOLONE ACETONIDE 0.5 % EX OINT
1.0000 | TOPICAL_OINTMENT | Freq: Two times a day (BID) | CUTANEOUS | 2 refills | Status: DC
Start: 2020-04-09 — End: 2021-06-22

## 2020-04-09 NOTE — Assessment & Plan Note (Signed)
BMI 40.42.  Recommended eating smaller high protein, low fat meals more frequently and exercising 30 mins a day 5 times a week with a goal of 10-15lb weight loss in the next 3 months. Patient voiced their understanding and motivation to adhere to these recommendations.  

## 2020-04-09 NOTE — Assessment & Plan Note (Signed)
I have recommended complete cessation of tobacco use. I have discussed various options available for assistance with tobacco cessation including over the counter methods (Nicotine gum, patch and lozenges). We also discussed prescription options (Chantix, Nicotine Inhaler / Nasal Spray). The patient is not interested in pursuing any prescription tobacco cessation options at this time.  

## 2020-04-09 NOTE — Patient Instructions (Signed)
DASH Eating Plan DASH stands for "Dietary Approaches to Stop Hypertension." The DASH eating plan is a healthy eating plan that has been shown to reduce high blood pressure (hypertension). It may also reduce your risk for type 2 diabetes, heart disease, and stroke. The DASH eating plan may also help with weight loss. What are tips for following this plan?  General guidelines  Avoid eating more than 2,300 mg (milligrams) of salt (sodium) a day. If you have hypertension, you may need to reduce your sodium intake to 1,500 mg a day.  Limit alcohol intake to no more than 1 drink a day for nonpregnant women and 2 drinks a day for men. One drink equals 12 oz of beer, 5 oz of wine, or 1 oz of hard liquor.  Work with your health care provider to maintain a healthy body weight or to lose weight. Ask what an ideal weight is for you.  Get at least 30 minutes of exercise that causes your heart to beat faster (aerobic exercise) most days of the week. Activities may include walking, swimming, or biking.  Work with your health care provider or diet and nutrition specialist (dietitian) to adjust your eating plan to your individual calorie needs. Reading food labels   Check food labels for the amount of sodium per serving. Choose foods with less than 5 percent of the Daily Value of sodium. Generally, foods with less than 300 mg of sodium per serving fit into this eating plan.  To find whole grains, look for the word "whole" as the first word in the ingredient list. Shopping  Buy products labeled as "low-sodium" or "no salt added."  Buy fresh foods. Avoid canned foods and premade or frozen meals. Cooking  Avoid adding salt when cooking. Use salt-free seasonings or herbs instead of table salt or sea salt. Check with your health care provider or pharmacist before using salt substitutes.  Do not fry foods. Cook foods using healthy methods such as baking, boiling, grilling, and broiling instead.  Cook with  heart-healthy oils, such as olive, canola, soybean, or sunflower oil. Meal planning  Eat a balanced diet that includes: ? 5 or more servings of fruits and vegetables each day. At each meal, try to fill half of your plate with fruits and vegetables. ? Up to 6-8 servings of whole grains each day. ? Less than 6 oz of lean meat, poultry, or fish each day. A 3-oz serving of meat is about the same size as a deck of cards. One egg equals 1 oz. ? 2 servings of low-fat dairy each day. ? A serving of nuts, seeds, or beans 5 times each week. ? Heart-healthy fats. Healthy fats called Omega-3 fatty acids are found in foods such as flaxseeds and coldwater fish, like sardines, salmon, and mackerel.  Limit how much you eat of the following: ? Canned or prepackaged foods. ? Food that is high in trans fat, such as fried foods. ? Food that is high in saturated fat, such as fatty meat. ? Sweets, desserts, sugary drinks, and other foods with added sugar. ? Full-fat dairy products.  Do not salt foods before eating.  Try to eat at least 2 vegetarian meals each week.  Eat more home-cooked food and less restaurant, buffet, and fast food.  When eating at a restaurant, ask that your food be prepared with less salt or no salt, if possible. What foods are recommended? The items listed may not be a complete list. Talk with your dietitian about   what dietary choices are best for you. Grains Whole-grain or whole-wheat bread. Whole-grain or whole-wheat pasta. Brown rice. Oatmeal. Quinoa. Bulgur. Whole-grain and low-sodium cereals. Pita bread. Low-fat, low-sodium crackers. Whole-wheat flour tortillas. Vegetables Fresh or frozen vegetables (raw, steamed, roasted, or grilled). Low-sodium or reduced-sodium tomato and vegetable juice. Low-sodium or reduced-sodium tomato sauce and tomato paste. Low-sodium or reduced-sodium canned vegetables. Fruits All fresh, dried, or frozen fruit. Canned fruit in natural juice (without  added sugar). Meat and other protein foods Skinless chicken or turkey. Ground chicken or turkey. Pork with fat trimmed off. Fish and seafood. Egg whites. Dried beans, peas, or lentils. Unsalted nuts, nut butters, and seeds. Unsalted canned beans. Lean cuts of beef with fat trimmed off. Low-sodium, lean deli meat. Dairy Low-fat (1%) or fat-free (skim) milk. Fat-free, low-fat, or reduced-fat cheeses. Nonfat, low-sodium ricotta or cottage cheese. Low-fat or nonfat yogurt. Low-fat, low-sodium cheese. Fats and oils Soft margarine without trans fats. Vegetable oil. Low-fat, reduced-fat, or light mayonnaise and salad dressings (reduced-sodium). Canola, safflower, olive, soybean, and sunflower oils. Avocado. Seasoning and other foods Herbs. Spices. Seasoning mixes without salt. Unsalted popcorn and pretzels. Fat-free sweets. What foods are not recommended? The items listed may not be a complete list. Talk with your dietitian about what dietary choices are best for you. Grains Baked goods made with fat, such as croissants, muffins, or some breads. Dry pasta or rice meal packs. Vegetables Creamed or fried vegetables. Vegetables in a cheese sauce. Regular canned vegetables (not low-sodium or reduced-sodium). Regular canned tomato sauce and paste (not low-sodium or reduced-sodium). Regular tomato and vegetable juice (not low-sodium or reduced-sodium). Pickles. Olives. Fruits Canned fruit in a light or heavy syrup. Fried fruit. Fruit in cream or butter sauce. Meat and other protein foods Fatty cuts of meat. Ribs. Fried meat. Bacon. Sausage. Bologna and other processed lunch meats. Salami. Fatback. Hotdogs. Bratwurst. Salted nuts and seeds. Canned beans with added salt. Canned or smoked fish. Whole eggs or egg yolks. Chicken or turkey with skin. Dairy Whole or 2% milk, cream, and half-and-half. Whole or full-fat cream cheese. Whole-fat or sweetened yogurt. Full-fat cheese. Nondairy creamers. Whipped toppings.  Processed cheese and cheese spreads. Fats and oils Butter. Stick margarine. Lard. Shortening. Ghee. Bacon fat. Tropical oils, such as coconut, palm kernel, or palm oil. Seasoning and other foods Salted popcorn and pretzels. Onion salt, garlic salt, seasoned salt, table salt, and sea salt. Worcestershire sauce. Tartar sauce. Barbecue sauce. Teriyaki sauce. Soy sauce, including reduced-sodium. Steak sauce. Canned and packaged gravies. Fish sauce. Oyster sauce. Cocktail sauce. Horseradish that you find on the shelf. Ketchup. Mustard. Meat flavorings and tenderizers. Bouillon cubes. Hot sauce and Tabasco sauce. Premade or packaged marinades. Premade or packaged taco seasonings. Relishes. Regular salad dressings. Where to find more information:  National Heart, Lung, and Blood Institute: www.nhlbi.nih.gov  American Heart Association: www.heart.org Summary  The DASH eating plan is a healthy eating plan that has been shown to reduce high blood pressure (hypertension). It may also reduce your risk for type 2 diabetes, heart disease, and stroke.  With the DASH eating plan, you should limit salt (sodium) intake to 2,300 mg a day. If you have hypertension, you may need to reduce your sodium intake to 1,500 mg a day.  When on the DASH eating plan, aim to eat more fresh fruits and vegetables, whole grains, lean proteins, low-fat dairy, and heart-healthy fats.  Work with your health care provider or diet and nutrition specialist (dietitian) to adjust your eating plan to your   individual calorie needs. This information is not intended to replace advice given to you by your health care provider. Make sure you discuss any questions you have with your health care provider. Document Revised: 05/13/2017 Document Reviewed: 05/24/2016 Elsevier Patient Education  2020 Elsevier Inc.  

## 2020-04-09 NOTE — Assessment & Plan Note (Signed)
Ongoing issue post Covid vaccination #1.  Had some improvement with Prednisone, repeat taper.  Script sent.  Will change from Benadryl to Hydroxyzine for pruritus and continue daily Zyrtec and Pepcid + steroid cream.  She is to see [facility] dermatology in January.  Recommend she return to office if ongoing issues or worsening.

## 2020-04-09 NOTE — Assessment & Plan Note (Signed)
Chronic, stable with BP at goal.  Continue Losartan 100 MG daily.  Have recommended she check BP at home daily and document for provider + focus on DASH diet.  Labs today to include BMP and TSH.  Refills sent.  Return in 6 months.

## 2020-04-09 NOTE — Progress Notes (Signed)
BP 121/82 (BP Location: Left Arm, Patient Position: Sitting, Cuff Size: Normal)   Pulse 94 Comment: apical  Temp 97.8 F (36.6 C) (Oral)   Resp 16   Ht 5\' 2"  (1.575 m)   Wt 221 lb (100.2 kg)   LMP 02/25/2009 (Approximate)   SpO2 97%   BMI 40.42 kg/m    Subjective:    Patient ID: Holly French, female    DOB: 1975-10-14, 44 y.o.   MRN: [id number]  HPI: Holly French is a 44 y.o. female  Chief Complaint  Patient presents with  . Hypertension   HYPERTENSION Continues on Losartan 100 MG.  Needs refills.  Continues to smoke about 1/4 PPD. Hypertension status: stable  Satisfied with current treatment? yes Duration of hypertension: chronic BP monitoring frequency:  rarely BP range:  BP medication side effects:  no Medication compliance: good compliance Aspirin: no Recurrent headaches: no Visual changes: no Palpitations: no Dyspnea: no Chest pain: no Lower extremity edema: no Dizzy/lightheaded: no   RASH Had allergic reaction to first Covid vaccine -- broke out in rash, saw provider in office on 03/07/20 for the rash and finished Prednisone and cream, but rash remains present + has pruritus with this.  Has been off Prednisone for a couple weeks now, but reports this did offer benefit.  Has been scratching so much has bruising in some areas.  Is going to see allergist in January, [facility]. Duration:  weeks  Location: generalized, arms and legs  Itching: yes Burning: no Redness: yes Oozing: no Scaling: no Blisters: no Painful: no Fevers: no Change in detergents/soaps/personal care products: no Recent illness: no Recent travel:no History of same: no Context: fluctuating Alleviating factors: Prednisone, Kenaolg, and steroid cream Treatments attempted:Prednisone, Kenaolg, and steroid cream, Benadryl, Pepcid, Zyrtec Shortness of breath: no  Throat/tongue swelling: no Myalgias/arthralgias: no  Relevant past medical, surgical, family and social history reviewed and  updated as indicated. Interim medical history since our last visit reviewed. Allergies and medications reviewed and updated.  Review of Systems  Constitutional: Negative for activity change, appetite change, diaphoresis, fatigue and fever.  Respiratory: Negative for cough, chest tightness and shortness of breath.   Cardiovascular: Negative for chest pain, palpitations and leg swelling.  Skin: Positive for rash.  Psychiatric/Behavioral: Negative.     Per HPI unless specifically indicated above     Objective:    BP 121/82 (BP Location: Left Arm, Patient Position: Sitting, Cuff Size: Normal)   Pulse 94 Comment: apical  Temp 97.8 F (36.6 C) (Oral)   Resp 16   Ht 5\' 2"  (1.575 m)   Wt 221 lb (100.2 kg)   LMP 02/25/2009 (Approximate)   SpO2 97%   BMI 40.42 kg/m   Wt Readings from Last 3 Encounters:  04/09/20 221 lb (100.2 kg)  03/07/20 227 lb (103 kg)  01/24/20 226 lb (102.5 kg)    Physical Exam Vitals and nursing note reviewed.  Constitutional:      General: She is awake. She is not in acute distress.    Appearance: She is well-developed and well-groomed. She is obese. She is not ill-appearing.  HENT:     Head: Normocephalic.     Right Ear: Hearing normal.     Left Ear: Hearing normal.  Eyes:     General: Lids are normal.        Right eye: No discharge.        Left eye: No discharge.     Conjunctiva/sclera: Conjunctivae normal.  Pupils: Pupils are equal, round, and reactive to light.  Neck:     Thyroid: No thyromegaly.     Vascular: No carotid bruit.  Cardiovascular:     Rate and Rhythm: Normal rate and regular rhythm.     Heart sounds: Normal heart sounds. No murmur heard.  No gallop.   Pulmonary:     Effort: Pulmonary effort is normal. No accessory muscle usage or respiratory distress.     Breath sounds: Normal breath sounds.  Abdominal:     General: Bowel sounds are normal.     Palpations: Abdomen is soft.  Musculoskeletal:     Cervical back: Normal range  of motion and neck supple.     Right lower leg: No edema.     Left lower leg: No edema.  Skin:    General: Skin is warm and dry.     Findings: Rash present. Rash is macular and papular.     Comments: Scattered macular/papular rash to bilateral upper extremities with crusting to areas, lower back, and bilateral thighs.  No vesicles present.  Neurological:     Mental Status: She is alert and oriented to person, place, and time.  Psychiatric:        Attention and Perception: Attention normal.        Mood and Affect: Mood normal.        Speech: Speech normal.        Behavior: Behavior normal. Behavior is cooperative.        Thought Content: Thought content normal.     Results for orders placed or performed in visit on 11/15/17  Measles/Mumps/Rubella Immunity  Result Value Ref Range   Rubella Antibodies, IGG 1.38 Immune >0.99 index   RUBEOLA AB, IGG 152.0 Immune >29.9 AU/mL   MUMPS ABS, IGG 87.0 Immune >10.9 AU/mL  IGP, Aptima HPV, rfx 16/18,45  Result Value Ref Range   DIAGNOSIS: Comment    Specimen adequacy: Comment    Clinician Provided ICD10 Comment    Performed by: Comment    PAP Smear Comment .    Note: Comment    Test Methodology Comment    HPV Aptima Negative Negative      Assessment & Plan:   Problem List Items Addressed This Visit      Cardiovascular and Mediastinum   Hypertension    Chronic, stable with BP at goal.  Continue Losartan 100 MG daily.  Have recommended she check BP at home daily and document for provider + focus on DASH diet.  Labs today to include BMP and TSH.  Refills sent.  Return in 6 months.      Relevant Medications   losartan (COZAAR) 100 MG tablet   Other Relevant Orders   Basic metabolic panel   TSH   CBC with Differential/Platelet     Musculoskeletal and Integument   Rash    Ongoing issue post Covid vaccination #1.  Had some improvement with Prednisone, repeat taper.  Script sent.  Will change from Benadryl to Hydroxyzine for pruritus  and continue daily Zyrtec and Pepcid + steroid cream.  She is to see [facility] dermatology in January.  Recommend she return to office if ongoing issues or worsening.      Relevant Orders   CBC with Differential/Platelet     Other   Nicotine dependence, cigarettes, uncomplicated    I have recommended complete cessation of tobacco use. I have discussed various options available for assistance with tobacco cessation including over the counter methods (Nicotine gum,  patch and lozenges). We also discussed prescription options (Chantix, Nicotine Inhaler / Nasal Spray). The patient is not interested in pursuing any prescription tobacco cessation options at this time.       Obesity - Primary    BMI 40.42.  Recommended eating smaller high protein, low fat meals more frequently and exercising 30 mins a day 5 times a week with a goal of 10-15lb weight loss in the next 3 months. Patient voiced their understanding and motivation to adhere to these recommendations.        Other Visit Diagnoses    Need for influenza vaccination       Flu vaccine today   Relevant Orders   Flu Vaccine QUAD 36+ mos IM (Completed)       Follow up plan: Return in about 6 months (around 10/08/2020) for HTN.

## 2020-04-10 LAB — CBC WITH DIFFERENTIAL/PLATELET
Basophils Absolute: 0 10*3/uL (ref 0.0–0.2)
Basos: 1 %
EOS (ABSOLUTE): 0.1 10*3/uL (ref 0.0–0.4)
Eos: 2 %
Hematocrit: 41.5 % (ref 34.0–46.6)
Hemoglobin: 13.8 g/dL (ref 11.1–15.9)
Immature Grans (Abs): 0 10*3/uL (ref 0.0–0.1)
Immature Granulocytes: 0 %
Lymphocytes Absolute: 1.4 10*3/uL (ref 0.7–3.1)
Lymphs: 22 %
MCH: 31.5 pg (ref 26.6–33.0)
MCHC: 33.3 g/dL (ref 31.5–35.7)
MCV: 95 fL (ref 79–97)
Monocytes Absolute: 0.4 10*3/uL (ref 0.1–0.9)
Monocytes: 7 %
Neutrophils Absolute: 4.3 10*3/uL (ref 1.4–7.0)
Neutrophils: 68 %
Platelets: 338 10*3/uL (ref 150–450)
RBC: 4.38 x10E6/uL (ref 3.77–5.28)
RDW: 12.5 % (ref 11.7–15.4)
WBC: 6.3 10*3/uL (ref 3.4–10.8)

## 2020-04-10 LAB — BASIC METABOLIC PANEL
BUN/Creatinine Ratio: 14 (ref 9–23)
BUN: 11 mg/dL (ref 6–24)
CO2: 23 mmol/L (ref 20–29)
Calcium: 9.2 mg/dL (ref 8.7–10.2)
Chloride: 99 mmol/L (ref 96–106)
Creatinine, Ser: 0.81 mg/dL (ref 0.57–1.00)
GFR calc Af Amer: 104 mL/min/{1.73_m2} (ref >59)
GFR calc non Af Amer: 90 mL/min/{1.73_m2} (ref >59)
Glucose: 97 mg/dL (ref 65–99)
Potassium: 4.1 mmol/L (ref 3.5–5.2)
Sodium: 135 mmol/L (ref 134–144)

## 2020-04-10 LAB — TSH: TSH: 1.42 u[IU]/mL (ref 0.450–4.500)

## 2020-04-10 NOTE — Progress Notes (Signed)
Contacted via MyChart  Good morning Holly French, your labs returned and overall everything looks normal. No changes needed.  Have a great day!! Keep being awesome!!  Thank you for allowing me to participate in your care. Kindest regards, Aniayah Alaniz

## 2020-06-08 ENCOUNTER — Telehealth: Payer: No Typology Code available for payment source | Admitting: Family

## 2020-06-08 DIAGNOSIS — B9689 Other specified bacterial agents as the cause of diseases classified elsewhere: Secondary | ICD-10-CM | POA: Diagnosis not present

## 2020-06-08 DIAGNOSIS — J019 Acute sinusitis, unspecified: Secondary | ICD-10-CM

## 2020-06-08 MED ORDER — AMOXICILLIN-POT CLAVULANATE 875-125 MG PO TABS: 1.0000 | ORAL_TABLET | Freq: Two times a day (BID) | ORAL | 0 refills | Status: DC

## 2020-06-08 NOTE — Progress Notes (Signed)

## 2020-06-10 ENCOUNTER — Telehealth: Payer: No Typology Code available for payment source | Admitting: Nurse Practitioner

## 2020-07-16 ENCOUNTER — Telehealth: Payer: No Typology Code available for payment source | Admitting: Nurse Practitioner

## 2020-07-17 ENCOUNTER — Encounter: Payer: Self-pay | Admitting: Nurse Practitioner

## 2020-07-17 ENCOUNTER — Telehealth (INDEPENDENT_AMBULATORY_CARE_PROVIDER_SITE_OTHER): Payer: No Typology Code available for payment source | Admitting: Nurse Practitioner

## 2020-07-17 DIAGNOSIS — J321 Chronic frontal sinusitis: Secondary | ICD-10-CM

## 2020-07-17 DIAGNOSIS — J329 Chronic sinusitis, unspecified: Secondary | ICD-10-CM | POA: Insufficient documentation

## 2020-07-17 MED ORDER — AMOXICILLIN-POT CLAVULANATE 875-125 MG PO TABS: 1.0000 | ORAL_TABLET | Freq: Two times a day (BID) | ORAL | 0 refills | Status: AC

## 2020-07-17 MED ORDER — PREDNISONE 20 MG PO TABS: 40.0000 mg | ORAL_TABLET | Freq: Every day | ORAL | 0 refills | Status: AC

## 2020-07-17 NOTE — Assessment & Plan Note (Signed)
Chronic issue with current acute flare post-Covid infection in December.  Symptoms on day 6-7.   Will send in script for Augmentin and Prednisone burst.  Recommend she continue all at home regimen.   - Increased rest - Increasing Fluids - Acetaminophen / ibuprofen as needed for fever/pain.  - Mucinex.  - Saline sinus flushes or a neti pot.  - Humidifying the air Return to office for any worsening or ongoing symptoms.

## 2020-07-17 NOTE — Progress Notes (Signed)
LMP 02/25/2009 (Approximate)    Subjective:    Patient ID: Holly French, female    DOB: October 06, 1975, 45 y.o.   MRN: [id number]  HPI: GLENDELL SCHLOTTMAN is a 45 y.o. female  Chief Complaint  Patient presents with  . 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 Patient states she  had covid right after  Christmas.     . This visit was completed via telephone due to the restrictions of the COVID-19 pandemic. All issues as above were discussed and addressed but no physical exam was performed. If it was felt that the patient should be evaluated in the office, they were directed there. The patient verbally consented to this visit. Patient was unable to complete an audio/visual visit due to Technical difficulties, Lack of internet. Due to the catastrophic nature of the COVID-19 pandemic, this visit was done through audio contact only. . Location of the patient: home . Location of the provider: work . Those involved with this call:  . Provider: Aura Dials, DNP . CMA: Tristan Schroeder, CMA . Front Desk/Registration: Harriet Pho  . Time spent on call: 21 minutes on the phone discussing health concerns. 15 minutes total spent in review of patient's record and preparation of their chart.  . I verified patient identity using two factors (patient name and date of birth). Patient consents verbally to being seen via telemedicine visit today.  Telephone only visit, patient and provider unable to hear one another well via video.  UPPER RESPIRATORY TRACT INFECTION Was diagnosed with Covid right after Christmas, has lingering cough.  Everything else improved, then over weekend (Friday) sinus symptoms returned.  Works in [employer], pediatric side.  Denies loss of taste or smell -- this has improved.  Fever: no Cough: yes Shortness of breath: no Wheezing: no Chest pain: no Chest tightness:  yes Chest congestion: yes Nasal congestion: yes Runny nose: yes Post nasal drip: yes Sneezing: yes Sore throat: no Swollen glands: no Sinus pressure: yes Headache: yes Face pain: yes Toothache: no Ear pain: none Ear pressure: none Eyes red/itching:yes Eye drainage/crusting: no  Vomiting: no Rash: no Fatigue: yes Sick contacts: yes Strep contacts: no  Context: fluctuating Relief with OTC cold/cough medications: yes  Treatments attempted: cold/sinus, mucinex and cough syrup, Zyrtec, Benadryl, Flonase, Neti Pot  Relevant past medical, surgical, family and social history reviewed and updated as indicated. Interim medical history since our last visit reviewed. Allergies and medications reviewed and updated.  Review of Systems  Constitutional: Positive for fatigue. Negative for activity change, appetite change and fever.  HENT: Positive for congestion, postnasal drip and rhinorrhea. Negative for ear discharge, ear pain, facial swelling, sinus pressure, sinus pain, sneezing, sore throat and voice change.   Eyes: Negative for pain and visual disturbance.  Respiratory: Positive for cough and chest tightness. Negative for shortness of breath and wheezing.   Cardiovascular: Negative for chest pain, palpitations and leg swelling.  Gastrointestinal: Negative.   Musculoskeletal: Negative for myalgias.  Neurological: Positive for headaches. Negative for dizziness and numbness.  Psychiatric/Behavioral: Negative.     Per HPI unless specifically indicated above     Objective:    LMP 02/25/2009 (Approximate)   Wt Readings from Last 3 Encounters:  04/09/20 221 lb (100.2 kg)  03/07/20 227 lb (103 kg)  01/24/20 226 lb (102.5 kg)    Physical Exam   Unable  to perform, briefly able to see patient, but unable to hear her and she was unable to hear provider.  Reports tenderness on palpation frontal sinus area.  Results for orders placed or performed in visit on 04/09/20  Basic metabolic  panel  Result Value Ref Range   Glucose 97 65 - 99 mg/dL   BUN 11 6 - 24 mg/dL   Creatinine, Ser 0.98 0.57 - 1.00 mg/dL   GFR calc non Af Amer 90 >59 mL/min/1.73   GFR calc Af Amer 104 >59 mL/min/1.73   BUN/Creatinine Ratio 14 9 - 23   Sodium 135 134 - 144 mmol/L   Potassium 4.1 3.5 - 5.2 mmol/L   Chloride 99 96 - 106 mmol/L   CO2 23 20 - 29 mmol/L   Calcium 9.2 8.7 - 10.2 mg/dL  TSH  Result Value Ref Range   TSH 1.420 0.450 - 4.500 uIU/mL  CBC with Differential/Platelet  Result Value Ref Range   WBC 6.3 3.4 - 10.8 x10E3/uL   RBC 4.38 3.77 - 5.28 x10E6/uL   Hemoglobin 13.8 11.1 - 15.9 g/dL   Hematocrit 11.9 14.7 - 46.6 %   MCV 95 79 - 97 fL   MCH 31.5 26.6 - 33.0 pg   MCHC 33.3 31.5 - 35.7 g/dL   RDW 82.9 56.2 - 13.0 %   Platelets 338 150 - 450 x10E3/uL   Neutrophils 68 Not Estab. %   Lymphs 22 Not Estab. %   Monocytes 7 Not Estab. %   Eos 2 Not Estab. %   Basos 1 Not Estab. %   Neutrophils Absolute 4.3 1.4 - 7.0 x10E3/uL   Lymphocytes Absolute 1.4 0.7 - 3.1 x10E3/uL   Monocytes Absolute 0.4 0.1 - 0.9 x10E3/uL   EOS (ABSOLUTE) 0.1 0.0 - 0.4 x10E3/uL   Basophils Absolute 0.0 0.0 - 0.2 x10E3/uL   Immature Granulocytes 0 Not Estab. %   Immature Grans (Abs) 0.0 0.0 - 0.1 x10E3/uL      Assessment & Plan:   Problem List Items Addressed This Visit      Respiratory   Chronic sinusitis - Primary    Chronic issue with current acute flare post-Covid infection in December.  Symptoms on day 6-7.   Will send in script for Augmentin and Prednisone burst.  Recommend she continue all at home regimen.   - Increased rest - Increasing Fluids - Acetaminophen / ibuprofen as needed for fever/pain.  - Mucinex.  - Saline sinus flushes or a neti pot.  - Humidifying the air Return to office for any worsening or ongoing symptoms.      Relevant Medications   amoxicillin-clavulanate (AUGMENTIN) 875-125 MG tablet   predniSONE (DELTASONE) 20 MG tablet      I discussed the assessment and  treatment plan with the patient. The patient was provided an opportunity to ask questions and all were answered. The patient agreed with the plan and demonstrated an understanding of the instructions.   The patient was advised to call back or seek an in-person evaluation if the symptoms worsen or if the condition fails to improve as anticipated.   I provided 21+ minutes of time during this encounter.  Follow up plan: Return if symptoms worsen or fail to improve.

## 2020-07-17 NOTE — Patient Instructions (Signed)

## 2020-07-22 ENCOUNTER — Telehealth: Payer: No Typology Code available for payment source | Admitting: Nurse Practitioner

## 2020-07-22 ENCOUNTER — Telehealth: Payer: Self-pay

## 2020-07-22 NOTE — Telephone Encounter (Signed)
Pt would like a vaccine exemption form filled pt had apt in 03/07/2020 for this but health at work stated she would need form resigned. Unsure if pt would need another apt or if form can be filled from the notation of reaction she had with first vaccine dosage that was documented and seen for on 03/07/2020.Form left in bin to be reviewed.

## 2020-07-22 NOTE — Telephone Encounter (Signed)
Have completed and placed in tray for fax or pick-up, please see if she ever visited with allergist as was ordered in September.

## 2020-07-22 NOTE — Telephone Encounter (Signed)
Placed in your folder for review. 

## 2020-07-23 NOTE — Telephone Encounter (Signed)
Called and LVM notifying patient that her form was completed and ready to be picked up.

## 2020-08-08 ENCOUNTER — Other Ambulatory Visit: Payer: Self-pay | Admitting: Nurse Practitioner

## 2020-08-08 ENCOUNTER — Telehealth: Payer: Self-pay

## 2020-08-08 MED ORDER — LOSARTAN POTASSIUM 50 MG PO TABS: 100.0000 mg | ORAL_TABLET | Freq: Every day | ORAL | 4 refills | Status: DC

## 2020-08-08 NOTE — Telephone Encounter (Signed)
Done, thank you!

## 2020-08-08 NOTE — Telephone Encounter (Signed)
Copied from CRM [id number]. Topic: General - Other >> Aug 08, 2020  4:23 PM Mcneil, Ja-Kwan wrote: Reason for CRM: Pt stated only has 1 pill for tonight and she was advised by her pharmacy that the losartan (COZAAR) 100 MG tablet is on back order. Pt requests that a new Rx for losartan (COZAAR) 50 MG tablet be sent to [facility] - [place], [place] - [address]

## 2020-10-03 ENCOUNTER — Encounter: Payer: Self-pay | Admitting: Nurse Practitioner

## 2020-10-08 ENCOUNTER — Ambulatory Visit: Payer: No Typology Code available for payment source | Admitting: Nurse Practitioner

## 2020-12-09 ENCOUNTER — Telehealth: Payer: No Typology Code available for payment source | Admitting: Emergency Medicine

## 2020-12-09 DIAGNOSIS — J019 Acute sinusitis, unspecified: Secondary | ICD-10-CM

## 2020-12-09 MED ORDER — AMOXICILLIN-POT CLAVULANATE 875-125 MG PO TABS: 1.0000 | ORAL_TABLET | Freq: Two times a day (BID) | ORAL | 0 refills | Status: DC

## 2020-12-09 NOTE — Progress Notes (Signed)

## 2021-01-12 ENCOUNTER — Other Ambulatory Visit: Payer: Self-pay | Admitting: Nurse Practitioner

## 2021-01-15 ENCOUNTER — Encounter: Payer: Self-pay | Admitting: Family Medicine

## 2021-01-15 ENCOUNTER — Other Ambulatory Visit: Payer: Self-pay

## 2021-01-15 ENCOUNTER — Ambulatory Visit (INDEPENDENT_AMBULATORY_CARE_PROVIDER_SITE_OTHER): Payer: No Typology Code available for payment source | Admitting: Family Medicine

## 2021-01-15 VITALS — BP 120/80 | HR 99 | Temp 98.1°F | Wt 229.2 lb

## 2021-01-15 DIAGNOSIS — J019 Acute sinusitis, unspecified: Secondary | ICD-10-CM

## 2021-01-15 MED ORDER — PREDNISONE 10 MG PO TABS: ORAL_TABLET | ORAL | 0 refills | Status: DC

## 2021-01-15 MED ORDER — PREDNISONE 10 MG PO TABS
10.0000 mg | ORAL_TABLET | Freq: Every day | ORAL | 0 refills | Status: DC
Start: 2021-01-15 — End: 2021-01-15

## 2021-01-15 MED ORDER — DOXYCYCLINE HYCLATE 100 MG PO TABS: 100.0000 mg | ORAL_TABLET | Freq: Two times a day (BID) | ORAL | 0 refills | Status: DC

## 2021-01-15 NOTE — Progress Notes (Signed)
BP 120/80   Pulse 99   Temp 98.1 F (36.7 C) (Oral)   Wt 229 lb 3.2 oz (104 kg)   LMP 02/25/2009 (Approximate)   SpO2 98%   BMI 41.92 kg/m    Subjective:    Patient ID: Holly French, female    DOB: 1976-01-16, 45 y.o.   MRN: [id number]  HPI: Holly French is a 45 y.o. female  Chief Complaint  Patient presents with   Sinusitis    Pt states she has had sinus congestion for the last 2 months    UPPER RESPIRATORY TRACT INFECTION Duration: 2 months Worst symptom: Fever: no Cough: no Shortness of breath: no Wheezing: no Chest pain: yes, with cough Chest tightness: no Chest congestion: no Nasal congestion: yes Runny nose: no Post nasal drip: no Sneezing: no Sore throat: no Swollen glands: no Sinus pressure: yes Headache: yes Face pain: no Toothache: no Ear pain: no  Ear pressure: no  Eyes red/itching:no Eye drainage/crusting: no  Vomiting: no Rash: no Fatigue: no Sick contacts: no Strep contacts: no  Context: stable Recurrent sinusitis: no Relief with OTC cold/cough medications: no  Treatments attempted: pseudoephedrine    Relevant past medical, surgical, family and social history reviewed and updated as indicated. Interim medical history since our last visit reviewed. Allergies and medications reviewed and updated.  Review of Systems  Constitutional: Negative.   HENT:  Positive for congestion, postnasal drip, sinus pressure and sinus pain. Negative for dental problem, drooling, ear discharge, ear pain, facial swelling, hearing loss, mouth sores, nosebleeds, rhinorrhea, sneezing, sore throat, tinnitus, trouble swallowing and voice change.   Eyes: Negative.   Respiratory: Negative.    Cardiovascular: Negative.   Psychiatric/Behavioral: Negative.     Per HPI unless specifically indicated above     Objective:    BP 120/80   Pulse 99   Temp 98.1 F (36.7 C) (Oral)   Wt 229 lb 3.2 oz (104 kg)   LMP 02/25/2009 (Approximate)   SpO2 98%   BMI  41.92 kg/m   Wt Readings from Last 3 Encounters:  01/15/21 229 lb 3.2 oz (104 kg)  04/09/20 221 lb (100.2 kg)  03/07/20 227 lb (103 kg)    Physical Exam Vitals and nursing note reviewed.  Constitutional:      General: She is not in acute distress.    Appearance: Normal appearance. She is not ill-appearing, toxic-appearing or diaphoretic.  HENT:     Head: Normocephalic and atraumatic.     Right Ear: Tympanic membrane, ear canal and external ear normal.     Left Ear: Tympanic membrane, ear canal and external ear normal.     Nose: Congestion and rhinorrhea present.     Mouth/Throat:     Mouth: Mucous membranes are moist.     Pharynx: Oropharynx is clear. No oropharyngeal exudate or posterior oropharyngeal erythema.  Eyes:     General: No scleral icterus.       Right eye: No discharge.        Left eye: No discharge.     Extraocular Movements: Extraocular movements intact.     Conjunctiva/sclera: Conjunctivae normal.     Pupils: Pupils are equal, round, and reactive to light.  Cardiovascular:     Rate and Rhythm: Normal rate and regular rhythm.     Pulses: Normal pulses.     Heart sounds: Normal heart sounds. No murmur heard.   No friction rub. No gallop.  Pulmonary:     Effort: Pulmonary effort  is normal. No respiratory distress.     Breath sounds: Normal breath sounds. No stridor. No wheezing, rhonchi or rales.  Chest:     Chest wall: No tenderness.  Musculoskeletal:        General: Normal range of motion.     Cervical back: Normal range of motion and neck supple.  Skin:    General: Skin is warm and dry.     Capillary Refill: Capillary refill takes less than 2 seconds.     Coloration: Skin is not jaundiced or pale.     Findings: No bruising, erythema, lesion or rash.  Neurological:     General: No focal deficit present.     Mental Status: She is alert and oriented to person, place, and time. Mental status is at baseline.  Psychiatric:        Mood and Affect: Mood normal.         Behavior: Behavior normal.        Thought Content: Thought content normal.        Judgment: Judgment normal.    Results for orders placed or performed in visit on 04/09/20  Basic metabolic panel  Result Value Ref Range   Glucose 97 65 - 99 mg/dL   BUN 11 6 - 24 mg/dL   Creatinine, Ser 8.29 0.57 - 1.00 mg/dL   GFR calc non Af Amer 90 >59 mL/min/1.73   GFR calc Af Amer 104 >59 mL/min/1.73   BUN/Creatinine Ratio 14 9 - 23   Sodium 135 134 - 144 mmol/L   Potassium 4.1 3.5 - 5.2 mmol/L   Chloride 99 96 - 106 mmol/L   CO2 23 20 - 29 mmol/L   Calcium 9.2 8.7 - 10.2 mg/dL  TSH  Result Value Ref Range   TSH 1.420 0.450 - 4.500 uIU/mL  CBC with Differential/Platelet  Result Value Ref Range   WBC 6.3 3.4 - 10.8 x10E3/uL   RBC 4.38 3.77 - 5.28 x10E6/uL   Hemoglobin 13.8 11.1 - 15.9 g/dL   Hematocrit 93.7 16.9 - 46.6 %   MCV 95 79 - 97 fL   MCH 31.5 26.6 - 33.0 pg   MCHC 33.3 31.5 - 35.7 g/dL   RDW 67.8 93.8 - 10.1 %   Platelets 338 150 - 450 x10E3/uL   Neutrophils 68 Not Estab. %   Lymphs 22 Not Estab. %   Monocytes 7 Not Estab. %   Eos 2 Not Estab. %   Basos 1 Not Estab. %   Neutrophils Absolute 4.3 1.4 - 7.0 x10E3/uL   Lymphocytes Absolute 1.4 0.7 - 3.1 x10E3/uL   Monocytes Absolute 0.4 0.1 - 0.9 x10E3/uL   EOS (ABSOLUTE) 0.1 0.0 - 0.4 x10E3/uL   Basophils Absolute 0.0 0.0 - 0.2 x10E3/uL   Immature Granulocytes 0 Not Estab. %   Immature Grans (Abs) 0.0 0.0 - 0.1 x10E3/uL      Assessment & Plan:   Problem List Items Addressed This Visit   None Visit Diagnoses     Acute sinusitis, recurrence not specified, unspecified location    -  Primary   Will treat with prednisone and doxycycline. If not getting better next week we will refer to ENT. Call with any concerns.    Relevant Medications   doxycycline (VIBRA-TABS) 100 MG tablet   predniSONE (DELTASONE) 10 MG tablet        Follow up plan: No follow-ups on file.

## 2021-02-19 ENCOUNTER — Encounter: Payer: Self-pay | Admitting: Family Medicine

## 2021-04-14 ENCOUNTER — Telehealth (INDEPENDENT_AMBULATORY_CARE_PROVIDER_SITE_OTHER): Payer: No Typology Code available for payment source | Admitting: Family Medicine

## 2021-04-14 ENCOUNTER — Encounter: Payer: Self-pay | Admitting: Family Medicine

## 2021-04-14 DIAGNOSIS — J329 Chronic sinusitis, unspecified: Secondary | ICD-10-CM

## 2021-04-14 MED ORDER — AMOXICILLIN-POT CLAVULANATE 875-125 MG PO TABS: 1.0000 | ORAL_TABLET | Freq: Two times a day (BID) | ORAL | 0 refills | Status: DC

## 2021-04-14 NOTE — Progress Notes (Signed)
LMP 02/25/2009 (Approximate)    Subjective:    Patient ID: Holly French, female    DOB: Jan 06, 1976, 45 y.o.   MRN: [id number]  HPI: Holly French is a 45 y.o. female  Chief Complaint  Patient presents with   Sinusitis    Patient states she is having cough, sore throat, headache and congestion for 2 weeks. Patient has tried tylenol cold and sinus otc and sudafed.    UPPER RESPIRATORY TRACT INFECTION Duration: 2.5 weeks Worst symptom: congestion, drainage Fever: no Cough: no Shortness of breath: no Wheezing: no Chest pain: no Chest tightness: no Chest congestion: no Nasal congestion: yes Runny nose: yes Post nasal drip: yes Sneezing: yes Sore throat: yes Swollen glands: no Sinus pressure: yes Headache: yes Face pain: yes Toothache: no Ear pain: no  Ear pressure: no  Eyes red/itching:no Eye drainage/crusting: no  Vomiting: no Rash: no Fatigue: yes Sick contacts: no Strep contacts: no  Context: worse Recurrent sinusitis: yes- this is her 6-7x this year Relief with OTC cold/cough medications: yes  Treatments attempted: cold/sinus, mucinex, anti-histamine, and pseudoephedrine   Relevant past medical, surgical, family and social history reviewed and updated as indicated. Interim medical history since our last visit reviewed. Allergies and medications reviewed and updated.  Review of Systems  Constitutional:  Positive for fatigue. Negative for activity change, appetite change, chills, diaphoresis, fever and unexpected weight change.  HENT:  Positive for congestion, postnasal drip, rhinorrhea, sinus pressure, sinus pain, sneezing and sore throat. Negative for dental problem, drooling, ear discharge, ear pain, facial swelling, hearing loss, mouth sores, nosebleeds, tinnitus, trouble swallowing and voice change.   Eyes: Negative.   Respiratory: Negative.    Cardiovascular: Negative.   Gastrointestinal: Negative.   Psychiatric/Behavioral: Negative.     Per HPI  unless specifically indicated above     Objective:    LMP 02/25/2009 (Approximate)   Wt Readings from Last 3 Encounters:  01/15/21 229 lb 3.2 oz (104 kg)  04/09/20 221 lb (100.2 kg)  03/07/20 227 lb (103 kg)    Physical Exam Vitals and nursing note reviewed.  Constitutional:      General: She is not in acute distress.    Appearance: Normal appearance. She is not ill-appearing, toxic-appearing or diaphoretic.  HENT:     Head: Normocephalic and atraumatic.     Right Ear: External ear normal.     Left Ear: External ear normal.     Nose: Nose normal.     Mouth/Throat:     Mouth: Mucous membranes are moist.     Pharynx: Oropharynx is clear.  Eyes:     General: No scleral icterus.       Right eye: No discharge.        Left eye: No discharge.     Conjunctiva/sclera: Conjunctivae normal.     Pupils: Pupils are equal, round, and reactive to light.  Pulmonary:     Effort: Pulmonary effort is normal. No respiratory distress.     Comments: Speaking in full sentences Musculoskeletal:        General: Normal range of motion.     Cervical back: Normal range of motion.  Skin:    Coloration: Skin is not jaundiced or pale.     Findings: No bruising, erythema, lesion or rash.  Neurological:     Mental Status: She is alert and oriented to person, place, and time. Mental status is at baseline.  Psychiatric:        Mood and Affect: Mood  normal.        Behavior: Behavior normal.        Thought Content: Thought content normal.        Judgment: Judgment normal.    Results for orders placed or performed in visit on 04/09/20  Basic metabolic panel  Result Value Ref Range   Glucose 97 65 - 99 mg/dL   BUN 11 6 - 24 mg/dL   Creatinine, Ser 7.51 0.57 - 1.00 mg/dL   GFR calc non Af Amer 90 >59 mL/min/1.73   GFR calc Af Amer 104 >59 mL/min/1.73   BUN/Creatinine Ratio 14 9 - 23   Sodium 135 134 - 144 mmol/L   Potassium 4.1 3.5 - 5.2 mmol/L   Chloride 99 96 - 106 mmol/L   CO2 23 20 - 29  mmol/L   Calcium 9.2 8.7 - 10.2 mg/dL  TSH  Result Value Ref Range   TSH 1.420 0.450 - 4.500 uIU/mL  CBC with Differential/Platelet  Result Value Ref Range   WBC 6.3 3.4 - 10.8 x10E3/uL   RBC 4.38 3.77 - 5.28 x10E6/uL   Hemoglobin 13.8 11.1 - 15.9 g/dL   Hematocrit 02.5 85.2 - 46.6 %   MCV 95 79 - 97 fL   MCH 31.5 26.6 - 33.0 pg   MCHC 33.3 31.5 - 35.7 g/dL   RDW 77.8 24.2 - 35.3 %   Platelets 338 150 - 450 x10E3/uL   Neutrophils 68 Not Estab. %   Lymphs 22 Not Estab. %   Monocytes 7 Not Estab. %   Eos 2 Not Estab. %   Basos 1 Not Estab. %   Neutrophils Absolute 4.3 1.4 - 7.0 x10E3/uL   Lymphocytes Absolute 1.4 0.7 - 3.1 x10E3/uL   Monocytes Absolute 0.4 0.1 - 0.9 x10E3/uL   EOS (ABSOLUTE) 0.1 0.0 - 0.4 x10E3/uL   Basophils Absolute 0.0 0.0 - 0.2 x10E3/uL   Immature Granulocytes 0 Not Estab. %   Immature Grans (Abs) 0.0 0.0 - 0.1 x10E3/uL      Assessment & Plan:   Problem List Items Addressed This Visit   None Visit Diagnoses     Recurrent sinusitis    -  Primary   Will treat with augmentin and get her back into ENT. Referral generated today. Call with any concerns or if not getting better.    Relevant Medications   amoxicillin-clavulanate (AUGMENTIN) 875-125 MG tablet   Other Relevant Orders   Ambulatory referral to ENT        Follow up plan: Return if symptoms worsen or fail to improve.   This visit was completed via video visit through MyChart due to the restrictions of the COVID-19 pandemic. All issues as above were discussed and addressed. Physical exam was done as above through visual confirmation on video through MyChart. If it was felt that the patient should be evaluated in the office, they were directed there. The patient verbally consented to this visit. Location of the patient: home Location of the provider: work Those involved with this call:  Provider: Olevia Perches, DO CMA: Rolley Sims, CMA Front Desk/Registration: [employer]  Time spent  on call:  15 minutes with patient face to face via video conference. More than 50% of this time was spent in counseling and coordination of care. 23 minutes total spent in review of patient's record and preparation of their chart.

## 2021-04-27 ENCOUNTER — Ambulatory Visit
Admission: RE | Admit: 2021-04-27 | Discharge: 2021-04-27 | Disposition: A | Discharge status: Home / Self Care | Payer: No Typology Code available for payment source | Source: Ambulatory Visit | Attending: Internal Medicine | Admitting: Internal Medicine

## 2021-04-27 ENCOUNTER — Ambulatory Visit: Payer: Self-pay

## 2021-04-27 ENCOUNTER — Ambulatory Visit
Admission: RE | Admit: 2021-04-27 | Discharge: 2021-04-27 | Disposition: A | Discharge status: Home / Self Care | Payer: No Typology Code available for payment source | Attending: Internal Medicine | Admitting: Internal Medicine

## 2021-04-27 ENCOUNTER — Encounter: Payer: Self-pay | Admitting: Internal Medicine

## 2021-04-27 ENCOUNTER — Ambulatory Visit (INDEPENDENT_AMBULATORY_CARE_PROVIDER_SITE_OTHER): Payer: No Typology Code available for payment source | Admitting: Internal Medicine

## 2021-04-27 ENCOUNTER — Other Ambulatory Visit: Payer: Self-pay

## 2021-04-27 VITALS — BP 132/78 | HR 99 | Temp 98.6°F | Ht 62.01 in | Wt 225.0 lb

## 2021-04-27 DIAGNOSIS — R059 Cough, unspecified: Secondary | ICD-10-CM | POA: Diagnosis present

## 2021-04-27 DIAGNOSIS — R0989 Other specified symptoms and signs involving the circulatory and respiratory systems: Secondary | ICD-10-CM

## 2021-04-27 DIAGNOSIS — J029 Acute pharyngitis, unspecified: Secondary | ICD-10-CM | POA: Insufficient documentation

## 2021-04-27 DIAGNOSIS — R052 Subacute cough: Secondary | ICD-10-CM | POA: Insufficient documentation

## 2021-04-27 MED ORDER — METHYLPREDNISOLONE 4 MG PO TBPK: ORAL_TABLET | ORAL | 0 refills | Status: DC

## 2021-04-27 MED ORDER — IPRATROPIUM-ALBUTEROL 0.5-2.5 (3) MG/3ML IN SOLN
3.0000 mL | Freq: Once | RESPIRATORY_TRACT | Status: AC
  Administered 2021-04-27: 3 mL via RESPIRATORY_TRACT

## 2021-04-27 MED ORDER — AMOXICILLIN-POT CLAVULANATE 875-125 MG PO TABS: 1.0000 | ORAL_TABLET | Freq: Two times a day (BID) | ORAL | 0 refills | Status: AC

## 2021-04-27 MED ORDER — METHYLPREDNISOLONE SODIUM SUCC 40 MG IJ SOLR
40.0000 mg | Freq: Once | INTRAMUSCULAR | Status: AC
Start: 2021-04-27 — End: 2021-04-27
  Administered 2021-04-27: 40 mg via INTRAMUSCULAR

## 2021-04-27 MED ORDER — ALBUTEROL SULFATE HFA 108 (90 BASE) MCG/ACT IN AERS: 2.0000 | INHALATION_SPRAY | Freq: Four times a day (QID) | RESPIRATORY_TRACT | 0 refills | Status: DC | PRN

## 2021-04-27 MED ORDER — FEXOFENADINE HCL 180 MG PO TABS: 180.0000 mg | ORAL_TABLET | Freq: Every day | ORAL | 1 refills | Status: DC

## 2021-04-27 NOTE — Telephone Encounter (Signed)
Pt. Finished antibiotic this weekend for sinus symptoms. Started coughing Friday, non-productive. Still having sinus symptoms. Appointment made for today.    Reason for Disposition  [1] Continuous (nonstop) coughing interferes with work or school AND [2] no improvement using cough treatment per Care Advice  Answer Assessment - Initial Assessment Questions 1. ONSET: "When did the cough begin?"      Friday 2. SEVERITY: "How bad is the cough today?"      Severe 3. SPUTUM: "Describe the color of your sputum" (none, dry cough; clear, white, yellow, green)     None 4. HEMOPTYSIS: "Are you coughing up any blood?" If so ask: "How much?" (flecks, streaks, tablespoons, etc.)     No 5. DIFFICULTY BREATHING: "Are you having difficulty breathing?" If Yes, ask: "How bad is it?" (e.g., mild, moderate, severe)    - MILD: No SOB at rest, mild SOB with walking, speaks normally in sentences, can lie down, no retractions, pulse < 100.    - MODERATE: SOB at rest, SOB with minimal exertion and prefers to sit, cannot lie down flat, speaks in phrases, mild retractions, audible wheezing, pulse 100-120.    - SEVERE: Very SOB at rest, speaks in single words, struggling to breathe, sitting hunched forward, retractions, pulse > 120      No 6. FEVER: "Do you have a fever?" If Yes, ask: "What is your temperature, how was it measured, and when did it start?"     No 7. CARDIAC HISTORY: "Do you have any history of heart disease?" (e.g., heart attack, congestive heart failure)      No 8. LUNG HISTORY: "Do you have any history of lung disease?"  (e.g., pulmonary embolus, asthma, emphysema)     Asthma 9. PE RISK FACTORS: "Do you have a history of blood clots?" (or: recent major surgery, recent prolonged travel, bedridden)     No 10. OTHER SYMPTOMS: "Do you have any other symptoms?" (e.g., runny nose, wheezing, chest pain)       Stuffy, chest tight 11. PREGNANCY: "Is there any chance you are pregnant?" "When was your last  menstrual period?"       No 12. TRAVEL: "Have you traveled out of the country in the last month?" (e.g., travel history, exposures)       No  Protocols used: Cough - Acute Non-Productive-A-AH

## 2021-04-27 NOTE — Progress Notes (Signed)
BP 132/78   Pulse 99   Temp 98.6 F (37 C) (Oral)   Ht 5' 2.01" (1.575 m)   Wt 225 lb (102.1 kg)   LMP 02/25/2009 (Approximate)   SpO2 98%   BMI 41.14 kg/m    Subjective:    Patient ID: Holly French, female    DOB: 11-03-1975, 45 y.o.   MRN: [id number]  Chief Complaint  Patient presents with  . Cough    Nasal congestion, chest congestion. And un able to breath out of her nose. Started around the end of October, Seen Dr Wynetta Emery on 11/1, was given Augmentin 875/125mg . Took entire course of ABX and does not feel better but feels worse.    HPI: Holly French is a 45 y.o. female  Cough This is a chronic (took abx augemtin 875 finished course of 10 days. to see ENT in dec1 st) problem. Episode onset: green. Associated symptoms include shortness of breath and wheezing. Pertinent negatives include no chest pain, ear pain, fever, headaches, hemoptysis, rash, rhinorrhea or sore throat.  Shortness of Breath This is a new (feels like she has tighntess) problem. Associated symptoms include wheezing. Pertinent negatives include no abdominal pain, chest pain, claudication, coryza, ear pain, fever, headaches, hemoptysis, leg swelling, orthopnea, rash, rhinorrhea, sore throat, syncope or vomiting.   Chief Complaint  Patient presents with  . Cough    Nasal congestion, chest congestion. And un able to breath out of her nose. Started around the end of October, Seen Dr Wynetta Emery on 11/1, was given Augmentin 875/125mg . Took entire course of ABX and does not feel better but feels worse.    Relevant past medical, surgical, family and social history reviewed and updated as indicated. Interim medical history since our last visit reviewed. Allergies and medications reviewed and updated.  Review of Systems  Constitutional:  Negative for fever.  HENT:  Negative for ear pain, rhinorrhea and sore throat.   Respiratory:  Positive for cough, shortness of breath and wheezing. Negative for hemoptysis.    Cardiovascular:  Negative for chest pain, orthopnea, claudication, leg swelling and syncope.  Gastrointestinal:  Negative for abdominal pain and vomiting.  Skin:  Negative for rash.  Neurological:  Negative for headaches.   Per HPI unless specifically indicated above     Objective:    BP 132/78   Pulse 99   Temp 98.6 F (37 C) (Oral)   Ht 5' 2.01" (1.575 m)   Wt 225 lb (102.1 kg)   LMP 02/25/2009 (Approximate)   SpO2 98%   BMI 41.14 kg/m   Wt Readings from Last 3 Encounters:  04/27/21 225 lb (102.1 kg)  01/15/21 229 lb 3.2 oz (104 kg)  04/09/20 221 lb (100.2 kg)    Physical Exam Pulmonary:     Effort: No respiratory distress.     Breath sounds: No stridor. No rhonchi.     Comments: Decreased breath sounds bilaterally   Results for orders placed or performed in visit on 0000000  Basic metabolic panel  Result Value Ref Range   Glucose 97 65 - 99 mg/dL   BUN 11 6 - 24 mg/dL   Creatinine, Ser 0.81 0.57 - 1.00 mg/dL   GFR calc non Af Amer 90 >59 mL/min/1.73   GFR calc Af Amer 104 >59 mL/min/1.73   BUN/Creatinine Ratio 14 9 - 23   Sodium 135 134 - 144 mmol/L   Potassium 4.1 3.5 - 5.2 mmol/L   Chloride 99 96 - 106 mmol/L  CO2 23 20 - 29 mmol/L   Calcium 9.2 8.7 - 10.2 mg/dL  TSH  Result Value Ref Range   TSH 1.420 0.450 - 4.500 uIU/mL  CBC with Differential/Platelet  Result Value Ref Range   WBC 6.3 3.4 - 10.8 x10E3/uL   RBC 4.38 3.77 - 5.28 x10E6/uL   Hemoglobin 13.8 11.1 - 15.9 g/dL   Hematocrit 27.0 62.3 - 46.6 %   MCV 95 79 - 97 fL   MCH 31.5 26.6 - 33.0 pg   MCHC 33.3 31.5 - 35.7 g/dL   RDW 76.2 83.1 - 51.7 %   Platelets 338 150 - 450 x10E3/uL   Neutrophils 68 Not Estab. %   Lymphs 22 Not Estab. %   Monocytes 7 Not Estab. %   Eos 2 Not Estab. %   Basos 1 Not Estab. %   Neutrophils Absolute 4.3 1.4 - 7.0 x10E3/uL   Lymphocytes Absolute 1.4 0.7 - 3.1 x10E3/uL   Monocytes Absolute 0.4 0.1 - 0.9 x10E3/uL   EOS (ABSOLUTE) 0.1 0.0 - 0.4 x10E3/uL    Basophils Absolute 0.0 0.0 - 0.2 x10E3/uL   Immature Granulocytes 0 Not Estab. %   Immature Grans (Abs) 0.0 0.0 - 0.1 x10E3/uL        Current Outpatient Medications:  .  amitriptyline (ELAVIL) 50 MG tablet, Take 50 mg by mouth at bedtime., Disp: , Rfl:  .  cetirizine (ZYRTEC) 10 MG tablet, Take 10 mg by mouth daily., Disp: , Rfl:  .  DULoxetine (CYMBALTA) 30 MG capsule, Take 30 mg by mouth 2 (two) times daily. , Disp: , Rfl: 0 .  famotidine (PEPCID) 10 MG tablet, Take 10 mg by mouth daily., Disp: , Rfl:  .  losartan (COZAAR) 50 MG tablet, TAKE TWO TABLETS BY MOUTH DAILY, Disp: 60 tablet, Rfl: 3 .  Melatonin 10 MG CAPS, Take by mouth at bedtime as needed. , Disp: , Rfl:  .  tiZANidine (ZANAFLEX) 4 MG tablet, Take 4 mg by mouth 3 (three) times daily., Disp: , Rfl:  .  triamcinolone ointment (KENALOG) 0.5 %, Apply 1 application topically 2 (two) times daily., Disp: 90 g, Rfl: 2    Assessment & Plan:  Acute bronchitis : Has a history of smoking With shortness of breath and difficulty/tightness in her chest : 120 mg of Solu-Medrol given to patient at visit today. Duo nebs x1 given to patient breathing much improved status post DuoNeb's. will start patient on albuterol  Medrol dose pack sent to the pharmacy check chest x-ray today Had 4 more days of Augmentin to complete a 2-week course for acute sinusitis/COPD exac  Problem List Items Addressed This Visit   None Visit Diagnoses     Cough in adult    -  Primary   Relevant Orders   Novel Coronavirus, NAA ([facility])   Rapid Strep screen([facility])   Veritor Flu A/B Waived   Sore throat       Relevant Orders   Novel Coronavirus, NAA ([facility])   Rapid Strep screen([facility])   Veritor Flu A/B Waived   Chest congestion       Relevant Orders   Novel Coronavirus, NAA ([facility])   Rapid Strep screen([facility])   Veritor Flu A/B Waived        Orders Placed This Encounter  Procedures  . Novel Coronavirus, NAA  ([facility])  . Rapid Strep screen([facility])  . Veritor Flu A/B Waived     No orders of the defined types were placed in this encounter.  Follow up plan: No follow-ups on file.

## 2021-04-27 NOTE — Progress Notes (Signed)
Please let pt know this was normal.

## 2021-04-29 ENCOUNTER — Other Ambulatory Visit: Payer: Self-pay | Admitting: Nurse Practitioner

## 2021-04-29 LAB — NOVEL CORONAVIRUS, NAA: SARS-CoV-2, NAA: NOT DETECTED

## 2021-04-29 LAB — SARS-COV-2, NAA 2 DAY TAT

## 2021-04-29 NOTE — Progress Notes (Signed)
Please let pt know this was normal.

## 2021-04-29 NOTE — Telephone Encounter (Signed)
Requested medications are due for refill today.  yes  Requested medications are on the active medications list.  yes  Last refill. 01/12/2021  Future visit scheduled.   no  Notes to clinic.  Failed protocol d/t expired labs.

## 2021-05-01 NOTE — Telephone Encounter (Signed)
Lmom for pt to call and schedule appointment for refill request.

## 2021-05-02 LAB — RAPID STREP SCREEN (MED CTR MEBANE ONLY): Strep Gp A Ag, IA W/Reflex: NEGATIVE

## 2021-05-02 LAB — VERITOR FLU A/B WAIVED
Influenza A: NEGATIVE
Influenza B: NEGATIVE

## 2021-05-02 LAB — CULTURE, GROUP A STREP

## 2021-06-02 ENCOUNTER — Other Ambulatory Visit: Payer: Self-pay | Admitting: Nurse Practitioner

## 2021-06-02 MED ORDER — LOSARTAN POTASSIUM 50 MG PO TABS
100.0000 mg | ORAL_TABLET | Freq: Every day | ORAL | 0 refills | Status: DC
Start: 2021-06-02 — End: 2021-07-10

## 2021-06-03 NOTE — Telephone Encounter (Signed)
Appt scheduled

## 2021-06-22 ENCOUNTER — Other Ambulatory Visit: Payer: Self-pay

## 2021-06-22 ENCOUNTER — Encounter: Payer: Self-pay | Admitting: Nurse Practitioner

## 2021-06-22 ENCOUNTER — Ambulatory Visit: Payer: Self-pay | Admitting: *Deleted

## 2021-06-22 ENCOUNTER — Ambulatory Visit (INDEPENDENT_AMBULATORY_CARE_PROVIDER_SITE_OTHER): Payer: No Typology Code available for payment source | Admitting: Nurse Practitioner

## 2021-06-22 VITALS — BP 120/78 | HR 105 | Temp 98.0°F | Resp 20

## 2021-06-22 DIAGNOSIS — L509 Urticaria, unspecified: Secondary | ICD-10-CM

## 2021-06-22 MED ORDER — PREDNISONE 10 MG PO TABS: ORAL_TABLET | ORAL | 0 refills | Status: DC

## 2021-06-22 MED ORDER — TRIAMCINOLONE ACETONIDE 0.5 % EX OINT: 1.0000 "application " | TOPICAL_OINTMENT | Freq: Two times a day (BID) | CUTANEOUS | 0 refills | Status: DC

## 2021-06-22 NOTE — Telephone Encounter (Signed)
Reason for Disposition  Hives or itching  Answer Assessment - Initial Assessment Questions 1. APPEARANCE of RASH: "Describe the rash." (e.g., spots, blisters, raised areas, skin peeling, scaly)     Red bumps everywhere 2. SIZE: "How big are the spots?" (e.g., tip of pen, eraser, coin; inches, centimeters)     Bumps within red area 3. LOCATION: "Where is the rash located?"     Legs,arms,stomach,back 4. COLOR: "What color is the rash?" (Note: It is difficult to assess rash color in people with darker-colored skin. When this situation occurs, simply ask the caller to describe what they see.)     Red raised areas 5. ONSET: "When did the rash begin?"     Saturday morning 6. FEVER: "Do you have a fever?" If Yes, ask: "What is your temperature, how was it measured, and when did it start?"     no 7. ITCHING: "Does the rash itch?" If Yes, ask: "How bad is the itch?" (Scale 1-10; or mild, moderate, severe)     Moderate/severe 8. CAUSE: "What do you think is causing the rash?"     Medication reaction 9. NEW MEDICATION: "What new medication are you taking?" (e.g., name of antibiotic) "When did you start taking this medication?".     Pepto- only change 10. OTHER SYMPTOMS: "Do you have any other symptoms?" (e.g., sore throat, fever, joint pain)       no 11. PREGNANCY: "Is there any chance you are pregnant?" "When was your last menstrual period?"       *No Answer*  Protocols used: Rash - Widespread On Drugs-A-AH

## 2021-06-22 NOTE — Assessment & Plan Note (Signed)
Noted on Saturday after taking peptobismol the night before. Due to diffuse spread, will treat with prednisone taper and triamcinolone cream prn. Stop taking peptobismol. Continue pepcid and allegra daily. Call with ongoing concerns or worsening symptoms go to ER.

## 2021-06-22 NOTE — Progress Notes (Signed)
Acute Office Visit  Subjective:    Patient ID: Holly French, female    DOB: 12/01/1975, 46 y.o.   MRN: [id number]  Chief Complaint  Patient presents with   Urticaria    Woke up on Saturday morning with hives all over.    HPI Patient is in today for hives all over her arms, chest, abdomen, back, and legs that started Saturday morning. She states that she had food poisoning with nausea, vomiting, and diarrhea on Friday. She took some peptobismol Friday night. She has never had a reaction to this medication in the past.   RASH  Duration:  days  Location: generalized  Itching: yes Burning: no Redness: yes Oozing: no Scaling: no Blisters: no Painful: no Fevers: no Change in detergents/soaps/personal care products: no Recent illness: no Recent travel:no History of same: no Context: stable Alleviating factors:  none Treatments attempted: allegra and hydrocortisone cream Shortness of breath: no  Throat/tongue swelling: no Myalgias/arthralgias: no   Past Medical History:  Diagnosis Date   Allergy    Asthma    Chronic urticaria    Dermatitis    Eczema    Hayfever    Hypertension    Vitamin D deficiency     Past Surgical History:  Procedure Laterality Date   ABDOMINAL HYSTERECTOMY     EPF and gastroc recession Right 01/14/2016   Per MD notes   laparoscopies     x3   NASAL SINUS SURGERY     x3   PLANTAR FASCIA RELEASE     SPINAL FUSION     lumbar    Family History  Problem Relation Age of Onset   Diabetes Mother    Hyperlipidemia Father    Allergies Daughter    COPD Maternal Grandmother    Lupus Maternal Grandmother    Cancer Maternal Grandfather        colon    Social History   Socioeconomic History   Marital status: Married    Spouse name: Not on file   Number of children: Not on file   Years of education: Not on file   Highest education level: Not on file  Occupational History   Not on file  Tobacco Use   Smoking status: Every Day     Packs/day: 0.25    Years: 18.00    Pack years: 4.50    Types: Cigarettes   Smokeless tobacco: Never  Vaping Use   Vaping Use: Never used  Substance and Sexual Activity   Alcohol use: No    Alcohol/week: 0.0 standard drinks   Drug use: No   Sexual activity: Yes  Other Topics Concern   Not on file  Social History Narrative   Not on file   Social Determinants of Health   Financial Resource Strain: Not on file  Food Insecurity: Not on file  Transportation Needs: Not on file  Physical Activity: Not on file  Stress: Not on file  Social Connections: Not on file  Intimate Partner Violence: Not on file    Outpatient Medications Prior to Visit  Medication Sig Dispense Refill   albuterol (VENTOLIN HFA) 108 (90 Base) MCG/ACT inhaler Inhale 2 puffs into the lungs every 6 (six) hours as needed for wheezing or shortness of breath. 8 g 0   amitriptyline (ELAVIL) 50 MG tablet Take 50 mg by mouth at bedtime.     DULoxetine (CYMBALTA) 30 MG capsule Take 30 mg by mouth 2 (two) times daily.   0   famotidine (  PEPCID) 10 MG tablet Take 10 mg by mouth daily.     fexofenadine (ALLEGRA ALLERGY) 180 MG tablet Take 1 tablet (180 mg total) by mouth daily. 10 tablet 1   HYDROcodone-acetaminophen (NORCO) 7.5-325 MG tablet Take 1 tablet by mouth every 8 (eight) hours as needed for moderate pain.     losartan (COZAAR) 50 MG tablet Take 2 tablets (100 mg total) by mouth daily. 120 tablet 0   Melatonin 10 MG CAPS Take by mouth at bedtime as needed.      tiZANidine (ZANAFLEX) 4 MG tablet Take 4 mg by mouth 3 (three) times daily.     methylPREDNISolone (MEDROL DOSEPAK) 4 MG TBPK tablet Use as directed 1 each 0   triamcinolone ointment (KENALOG) 0.5 % Apply 1 application topically 2 (two) times daily. 90 g 2   No facility-administered medications prior to visit.    Allergies  Allergen Reactions   Covid-19 (Mrna) Vaccine Proofreader(Pfizer) [Covid-19 (Mrna) Vaccine]    Neosporin [Neomycin-Bacitracin Zn-Polymyx]  Dermatitis   Latex Rash    Review of Systems  Constitutional:  Positive for fatigue.  Respiratory: Negative.    Cardiovascular: Negative.   Gastrointestinal: Negative.   Musculoskeletal:  Positive for back pain (chronic).  Skin:  Positive for rash.  Neurological: Negative.       Objective:    Physical Exam Vitals and nursing note reviewed.  Constitutional:      General: She is not in acute distress.    Appearance: Normal appearance.  HENT:     Head: Normocephalic.  Eyes:     Conjunctiva/sclera: Conjunctivae normal.  Cardiovascular:     Rate and Rhythm: Normal rate and regular rhythm.     Pulses: Normal pulses.     Heart sounds: Normal heart sounds.  Pulmonary:     Effort: Pulmonary effort is normal.     Breath sounds: Normal breath sounds.  Musculoskeletal:     Cervical back: Normal range of motion.  Skin:    General: Skin is warm.     Findings: Rash present.     Comments: Raised red wheels scattered over arms, chest, back, and legs  Neurological:     General: No focal deficit present.     Mental Status: She is alert and oriented to person, place, and time.  Psychiatric:        Mood and Affect: Mood normal.        Behavior: Behavior normal.        Thought Content: Thought content normal.        Judgment: Judgment normal.    BP 120/78 (BP Location: Right Arm, Patient Position: Sitting)    Pulse (!) 105    Temp 98 F (36.7 C) (Oral)    Resp 20    LMP 02/25/2009 (Approximate)    SpO2 97%  Wt Readings from Last 3 Encounters:  04/27/21 225 lb (102.1 kg)  01/15/21 229 lb 3.2 oz (104 kg)  04/09/20 221 lb (100.2 kg)    Health Maintenance Due  Topic Date Due   Pneumococcal Vaccine 3619-44 Years old (1 - PCV) Never done   HIV Screening  Never done   Hepatitis C Screening  Never done   TETANUS/TDAP  06/14/2018   COVID-19 Vaccine (2 - Pfizer risk series) 03/13/2020   INFLUENZA VACCINE  01/12/2021    There are no preventive care reminders to display for this  patient.   Lab Results  Component Value Date   TSH 1.420 04/09/2020   Lab Results  Component Value Date   WBC 6.3 04/09/2020   HGB 13.8 04/09/2020   HCT 41.5 04/09/2020   MCV 95 04/09/2020   PLT 338 04/09/2020   Lab Results  Component Value Date   NA 135 04/09/2020   K 4.1 04/09/2020   CO2 23 04/09/2020   GLUCOSE 97 04/09/2020   BUN 11 04/09/2020   CREATININE 0.81 04/09/2020   BILITOT <0.2 10/12/2017   ALKPHOS 84 10/12/2017   AST 17 10/12/2017   ALT 16 10/12/2017   PROT 6.8 10/12/2017   ALBUMIN 4.3 10/12/2017   CALCIUM 9.2 04/09/2020   Lab Results  Component Value Date   CHOL 204 (H) 10/12/2017   Lab Results  Component Value Date   HDL 46 10/12/2017   Lab Results  Component Value Date   LDLCALC 128 (H) 10/12/2017   Lab Results  Component Value Date   TRIG 149 10/12/2017   No results found for: CHOLHDL No results found for: ZOXW9U     Assessment & Plan:   Problem List Items Addressed This Visit       Musculoskeletal and Integument   Urticaria - Primary    Noted on Saturday after taking peptobismol the night before. Due to diffuse spread, will treat with prednisone taper and triamcinolone cream prn. Stop taking peptobismol. Continue pepcid and allegra daily. Call with ongoing concerns or worsening symptoms go to ER.         Meds ordered this encounter  Medications   predniSONE (DELTASONE) 10 MG tablet    Sig: Take 6 tablets today, then 5 tablets tomorrow, then decrease by 1 tablet every day until gone    Dispense:  21 tablet    Refill:  0   triamcinolone ointment (KENALOG) 0.5 %    Sig: Apply 1 application topically 2 (two) times daily.    Dispense:  90 g    Refill:  0     Gerre Scull, NP

## 2021-06-22 NOTE — Telephone Encounter (Signed)
°  Chief Complaint: wide spread hives Symptoms: itching, hives Frequency: started Saturday Pertinent Negatives: Patient denies fever, SOB,swelling Disposition: [] ED /[] Urgent Care (no appt availability in office) / [x] Appointment(In office/virtual)/ []  [facility]/ [] Home Care/ [] Refused Recommended Disposition /[] [facility]/ []  Follow-up with PCP Additional Notes:

## 2021-07-10 ENCOUNTER — Other Ambulatory Visit: Payer: Self-pay

## 2021-07-10 ENCOUNTER — Ambulatory Visit (INDEPENDENT_AMBULATORY_CARE_PROVIDER_SITE_OTHER): Payer: No Typology Code available for payment source | Admitting: Nurse Practitioner

## 2021-07-10 ENCOUNTER — Encounter: Payer: Self-pay | Admitting: Nurse Practitioner

## 2021-07-10 VITALS — BP 122/80 | HR 98 | Temp 98.1°F | Ht 62.01 in | Wt 226.6 lb

## 2021-07-10 DIAGNOSIS — I10 Essential (primary) hypertension: Secondary | ICD-10-CM

## 2021-07-10 DIAGNOSIS — E66813 Obesity, class 3: Secondary | ICD-10-CM

## 2021-07-10 DIAGNOSIS — Z6841 Body Mass Index (BMI) 40.0 and over, adult: Secondary | ICD-10-CM

## 2021-07-10 DIAGNOSIS — F1120 Opioid dependence, uncomplicated: Secondary | ICD-10-CM | POA: Diagnosis not present

## 2021-07-10 DIAGNOSIS — F17210 Nicotine dependence, cigarettes, uncomplicated: Secondary | ICD-10-CM

## 2021-07-10 DIAGNOSIS — M961 Postlaminectomy syndrome, not elsewhere classified: Secondary | ICD-10-CM

## 2021-07-10 MED ORDER — LOSARTAN POTASSIUM 50 MG PO TABS: 100.0000 mg | ORAL_TABLET | Freq: Every day | ORAL | 4 refills | Status: DC

## 2021-07-10 NOTE — Assessment & Plan Note (Addendum)
I have recommended complete cessation of tobacco use. I have discussed various options available for assistance with tobacco cessation including over the counter methods (Nicotine gum, patch and lozenges). We also discussed prescription options (Chantix, Nicotine Inhaler / Nasal Spray). The patient is not interested in pursuing any prescription tobacco cessation options at this time.  

## 2021-07-10 NOTE — Assessment & Plan Note (Signed)
Followed by  Neurosurgery, continue this collaboration and medications as ordered by them. °

## 2021-07-10 NOTE — Patient Instructions (Signed)

## 2021-07-10 NOTE — Progress Notes (Signed)
BP 122/80    Pulse 98    Temp 98.1 F (36.7 C) (Oral)    Ht 5' 2.01" (1.575 m)    Wt 226 lb 9.6 oz (102.8 kg)    LMP 02/25/2009 (Approximate)    SpO2 99%    BMI 41.43 kg/m    Subjective:    Patient ID: Holly French, female    DOB: 05/11/1976, 46 y.o.   MRN: [id number]  HPI: Holly French is a 46 y.o. female  Chief Complaint  Patient presents with   Hypertension    Patient states she is here to follow up on Hypertension. 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 Patient denies having any concerns at today's visit.    HYPERTENSION Continues on Losartan 100 MG.  Needs refills.  Continues to smoke about 1/2 PPD, started smoking off and on since age 52.  Last follow-up with provider was 04/09/2020. Hypertension status: stable  Satisfied with current treatment? yes Duration of hypertension: chronic BP monitoring frequency: none BP range:  BP medication side effects:  no Medication compliance: good compliance Aspirin: no Recurrent headaches: no Visual changes: no Palpitations: no Dyspnea: no Chest pain: no Lower extremity edema: no Dizzy/lightheaded: no   BACK PAIN Currently followed by [facility] due to chronic pain after her past lower back surgery in 2011.  She is currently being given cortisone injections + obtains Cymbalta, Amitriptyline, Norco, and Zanaflex.  Currently taking Melatonin to help her sleep at night.  Alleviating factors: APAP, narcotics, and muscle relaxer Status: stable Treatments attempted:  as above and APAP  Relief with NSAIDs?: mild Nighttime pain:  no Paresthesias / decreased sensation:  occasional to right leg due to nerve damage there Bowel / bladder incontinence:  no Fevers:  no Dysuria / urinary frequency:  no   Depression screen [facility] 2/9 07/10/2021 04/27/2021 07/17/2020 02/02/2019 12/29/2018  Decreased Interest 1 1 0 0 1  Down, Depressed, Hopeless 0 1 0 0 0  PHQ - 2 Score 1 2 0 0 1  Altered  sleeping 2 2 0 2 3  Tired, decreased energy 2 2 0 2 2  Change in appetite 0 0 0 1 0  Feeling bad or failure about yourself  0 0 0 1 0  Trouble concentrating 1 0 0 1 1  Moving slowly or fidgety/restless 0 0 0 0 0  Suicidal thoughts 0 0 0 0 0  PHQ-9 Score 6 6 0 7 7  Difficult doing work/chores - Somewhat difficult - Somewhat difficult -    GAD 7 : Generalized Anxiety Score 07/10/2021 02/02/2019 12/29/2018  Nervous, Anxious, on Edge 1 3 3   Control/stop worrying 1 2 3   Worry too much - different things 0 1 1  Trouble relaxing 1 3 3   Restless 0 1 1  Easily annoyed or irritable 1 2 0  Afraid - awful might happen 0 3 3  Total GAD 7 Score 4 15 14   Anxiety Difficulty Not difficult at all - Somewhat difficult    Relevant past medical, surgical, family and social history reviewed and updated as indicated. Interim medical history since our last visit reviewed. Allergies and medications reviewed and updated.  Review of Systems  Constitutional:  Negative for activity change, appetite change, diaphoresis, fatigue and fever.  Respiratory:  Negative for cough, chest tightness and shortness of breath.   Cardiovascular:  Negative for chest pain, palpitations and leg swelling.  Gastrointestinal: Negative.   Neurological:  Negative.   Psychiatric/Behavioral: Negative.     Per HPI unless specifically indicated above     Objective:    BP 122/80    Pulse 98    Temp 98.1 F (36.7 C) (Oral)    Ht 5' 2.01" (1.575 m)    Wt 226 lb 9.6 oz (102.8 kg)    LMP 02/25/2009 (Approximate)    SpO2 99%    BMI 41.43 kg/m   Wt Readings from Last 3 Encounters:  07/10/21 226 lb 9.6 oz (102.8 kg)  04/27/21 225 lb (102.1 kg)  01/15/21 229 lb 3.2 oz (104 kg)    Physical Exam Vitals and nursing note reviewed.  Constitutional:      General: She is awake. She is not in acute distress.    Appearance: She is well-developed and well-groomed. She is not ill-appearing or toxic-appearing.  HENT:     Head: Normocephalic.      Right Ear: Hearing normal.     Left Ear: Hearing normal.     Nose: Nose normal.     Mouth/Throat:     Mouth: Mucous membranes are moist.  Eyes:     General: Lids are normal.        Right eye: No discharge.        Left eye: No discharge.     Conjunctiva/sclera: Conjunctivae normal.     Pupils: Pupils are equal, round, and reactive to light.  Neck:     Thyroid: No thyromegaly.     Vascular: No carotid bruit or JVD.  Cardiovascular:     Rate and Rhythm: Normal rate and regular rhythm.     Heart sounds: Normal heart sounds. No murmur heard.   No gallop.  Pulmonary:     Effort: Pulmonary effort is normal.     Breath sounds: Normal breath sounds.  Abdominal:     General: Bowel sounds are normal.     Palpations: Abdomen is soft.  Musculoskeletal:     Cervical back: Normal range of motion and neck supple.     Right lower leg: No edema.     Left lower leg: No edema.  Lymphadenopathy:     Cervical: No cervical adenopathy.  Skin:    General: Skin is warm and dry.  Neurological:     Mental Status: She is alert and oriented to person, place, and time.  Psychiatric:        Attention and Perception: Attention normal.        Mood and Affect: Mood normal.        Speech: Speech normal.        Behavior: Behavior normal. Behavior is cooperative.        Thought Content: Thought content normal.    Results for orders placed or performed in visit on 04/27/21  Novel Coronavirus, NAA ([facility])   Specimen: Nasopharyngeal(NP) swabs in vial transport medium  Result Value Ref Range   SARS-CoV-2, NAA Not Detected Not Detected  Rapid Strep Screen ([facility] ONLY)   Naso  Result Value Ref Range   Strep Gp A Ag, IA W/Reflex Negative Negative  Culture, Group A Strep   Naso  Result Value Ref Range   Strep A Culture Comment (A)   SARS-COV-2, NAA 2 DAY TAT  Result Value Ref Range   SARS-CoV-2, NAA 2 DAY TAT Performed   Veritor Flu A/B Waived  Result Value Ref Range   Influenza A Negative  Negative   Influenza B Negative Negative  Assessment & Plan:   Problem List Items Addressed This Visit       Cardiovascular and Mediastinum   Hypertension    Chronic, stable with BP at goal today.  Recommend she monitor BP at least a few mornings a week at home and document.  DASH diet at home.  Continue current medication regimen and adjust as needed, refills sent in.  Labs at physical on Monday.       Relevant Medications   losartan (COZAAR) 50 MG tablet     Other   Continuous opioid dependence (HCC)    Followed by [facility], continue this collaboration and medications as ordered by them.      Lumbar post-laminectomy syndrome    Followed by [facility], continue this collaboration and medications as ordered by them.      Nicotine dependence, cigarettes, uncomplicated    I have recommended complete cessation of tobacco use. I have discussed various options available for assistance with tobacco cessation including over the counter methods (Nicotine gum, patch and lozenges). We also discussed prescription options (Chantix, Nicotine Inhaler / Nasal Spray). The patient is not interested in pursuing any prescription tobacco cessation options at this time.        Obesity - Primary    BMI 41.43.  Recommended eating smaller high protein, low fat meals more frequently and exercising 30 mins a day 5 times a week with a goal of 10-15lb weight loss in the next 3 months. Patient voiced their understanding and motivation to adhere to these recommendations.         Follow up plan: Return for as scheduled on Monday.

## 2021-07-10 NOTE — Assessment & Plan Note (Signed)
BMI 41.43.  Recommended eating smaller high protein, low fat meals more frequently and exercising 30 mins a day 5 times a week with a goal of 10-15lb weight loss in the next 3 months. Patient voiced their understanding and motivation to adhere to these recommendations.

## 2021-07-10 NOTE — Patient Instructions (Addendum)
Please call to schedule your mammogram and/or bone density: °[facility]  °Address: [address], [place],  [address]  °Phone: [phone]  ° °DASH Eating Plan °DASH stands for Dietary Approaches to Stop Hypertension. The DASH eating plan is a healthy eating plan that has been shown to: °Reduce high blood pressure (hypertension). °Reduce your risk for type 2 diabetes, heart disease, and stroke. °Help with weight loss. °What are tips for following this plan? °Reading food labels °Check food labels for the amount of salt (sodium) per serving. Choose foods with less than 5 percent of the Daily Value of sodium. Generally, foods with less than 300 milligrams (mg) of sodium per serving fit into this eating plan. °To find whole grains, look for the word "whole" as the first word in the ingredient list. °Shopping °Buy products labeled as "low-sodium" or "no salt added." °Buy fresh foods. Avoid canned foods and pre-made or frozen meals. °Cooking °Avoid adding salt when cooking. Use salt-free seasonings or herbs instead of table salt or sea salt. Check with your health care provider or pharmacist before using salt substitutes. °Do not fry foods. Cook foods using healthy methods such as baking, boiling, grilling, roasting, and broiling instead. °Cook with heart-healthy oils, such as olive, canola, avocado, soybean, or sunflower oil. °Meal planning ° °Eat a balanced diet that includes: °4 or more servings of fruits and 4 or more servings of vegetables each day. Try to fill one-half of your plate with fruits and vegetables. °6-8 servings of whole grains each day. °Less than 6 oz (170 g) of lean meat, poultry, or fish each day. A 3-oz (85-g) serving of meat is about the same size as a deck of cards. One egg equals 1 oz (28 g). °2-3 servings of low-fat dairy each day. One serving is 1 cup (237 mL). °1 serving of nuts, seeds, or beans 5 times each week. °2-3 servings of heart-healthy  fats. Healthy fats called omega-3 fatty acids are found in foods such as walnuts, flaxseeds, fortified milks, and eggs. These fats are also found in cold-water fish, such as sardines, salmon, and mackerel. °Limit how much you eat of: °Canned or prepackaged foods. °Food that is high in trans fat, such as some fried foods. °Food that is high in saturated fat, such as fatty meat. °Desserts and other sweets, sugary drinks, and other foods with added sugar. °Full-fat dairy products. °Do not salt foods before eating. °Do not eat more than 4 egg yolks a week. °Try to eat at least 2 vegetarian meals a week. °Eat more home-cooked food and less restaurant, buffet, and fast food. °Lifestyle °When eating at a restaurant, ask that your food be prepared with less salt or no salt, if possible. °If you drink alcohol: °Limit how much you use to: °0-1 drink a day for women who are not pregnant. °0-2 drinks a day for men. °Be aware of how much alcohol is in your drink. In the [place] one drink equals one 12 oz bottle of beer (355 mL), one 5 oz glass of wine (148 mL), or one 1½ oz glass of hard liquor (44 mL). °General information °Avoid eating more than 2,300 mg of salt a day. If you have hypertension, you may need to reduce your sodium intake to 1,500 mg a day. °Work with your health care provider to maintain a healthy body weight or to lose weight. Ask what an ideal weight is for you. °Get at least 30 minutes of   exercise that causes your heart to beat faster (aerobic exercise) most days of the week. Activities may include walking, swimming, or biking. °Work with your health care provider or dietitian to adjust your eating plan to your individual calorie needs. °What foods should I eat? °Fruits °All fresh, dried, or frozen fruit. Canned fruit in natural juice (without added sugar). °Vegetables °Fresh or frozen vegetables (raw, steamed, roasted, or grilled). Low-sodium or reduced-sodium tomato and vegetable juice. Low-sodium or  reduced-sodium tomato sauce and tomato paste. Low-sodium or reduced-sodium canned vegetables. °Grains °Whole-grain or whole-wheat bread. Whole-grain or whole-wheat pasta. Brown rice. Oatmeal. Quinoa. Bulgur. Whole-grain and low-sodium cereals. Pita bread. Low-fat, low-sodium crackers. Whole-wheat flour tortillas. °Meats and other proteins °Skinless chicken or turkey. Ground chicken or turkey. Pork with fat trimmed off. Fish and seafood. Egg whites. Dried beans, peas, or lentils. Unsalted nuts, nut butters, and seeds. Unsalted canned beans. Lean cuts of beef with fat trimmed off. Low-sodium, lean precooked or cured meat, such as sausages or meat loaves. °Dairy °Low-fat (1%) or fat-free (skim) milk. Reduced-fat, low-fat, or fat-free cheeses. Nonfat, low-sodium ricotta or cottage cheese. Low-fat or nonfat yogurt. Low-fat, low-sodium cheese. °Fats and oils °Soft margarine without trans fats. Vegetable oil. Reduced-fat, low-fat, or light mayonnaise and salad dressings (reduced-sodium). Canola, safflower, olive, avocado, soybean, and sunflower oils. Avocado. °Seasonings and condiments °Herbs. Spices. Seasoning mixes without salt. °Other foods °Unsalted popcorn and pretzels. Fat-free sweets. °The items listed above may not be a complete list of foods and beverages you can eat. Contact a dietitian for more information. °What foods should I avoid? °Fruits °Canned fruit in a light or heavy syrup. Fried fruit. Fruit in cream or butter sauce. °Vegetables °Creamed or fried vegetables. Vegetables in a cheese sauce. Regular canned vegetables (not low-sodium or reduced-sodium). Regular canned tomato sauce and paste (not low-sodium or reduced-sodium). Regular tomato and vegetable juice (not low-sodium or reduced-sodium). Pickles. Olives. °Grains °Baked goods made with fat, such as croissants, muffins, or some breads. Dry pasta or rice meal packs. °Meats and other proteins °Fatty cuts of meat. Ribs. Fried meat. Bacon. Bologna,  salami, and other precooked or cured meats, such as sausages or meat loaves. Fat from the back of a pig (fatback). Bratwurst. Salted nuts and seeds. Canned beans with added salt. Canned or smoked fish. Whole eggs or egg yolks. Chicken or turkey with skin. °Dairy °Whole or 2% milk, cream, and half-and-half. Whole or full-fat cream cheese. Whole-fat or sweetened yogurt. Full-fat cheese. Nondairy creamers. Whipped toppings. Processed cheese and cheese spreads. °Fats and oils °Butter. Stick margarine. Lard. Shortening. Ghee. Bacon fat. Tropical oils, such as coconut, palm kernel, or palm oil. °Seasonings and condiments °Onion salt, garlic salt, seasoned salt, table salt, and sea salt. Worcestershire sauce. Tartar sauce. Barbecue sauce. Teriyaki sauce. Soy sauce, including reduced-sodium. Steak sauce. Canned and packaged gravies. Fish sauce. Oyster sauce. Cocktail sauce. Store-bought horseradish. Ketchup. Mustard. Meat flavorings and tenderizers. Bouillon cubes. Hot sauces. Pre-made or packaged marinades. Pre-made or packaged taco seasonings. Relishes. Regular salad dressings. °Other foods °Salted popcorn and pretzels. °The items listed above may not be a complete list of foods and beverages you should avoid. Contact a dietitian for more information. °Where to find more information °National Heart, Lung, and Blood Institute: www.nhlbi.nih.gov °American Heart Association: www.heart.org °Academy of Nutrition and Dietetics: www.eatright.org °National Kidney Foundation: www.kidney.org °Summary °The DASH eating plan is a healthy eating plan that has been shown to reduce high blood pressure (hypertension). It may also reduce your risk for type 2   diabetes, heart disease, and stroke. °When on the DASH eating plan, aim to eat more fresh fruits and vegetables, whole grains, lean proteins, low-fat dairy, and heart-healthy fats. °With the DASH eating plan, you should limit salt (sodium) intake to 2,300 mg a day. If you have  hypertension, you may need to reduce your sodium intake to 1,500 mg a day. °Work with your health care provider or dietitian to adjust your eating plan to your individual calorie needs. °This information is not intended to replace advice given to you by your health care provider. Make sure you discuss any questions you have with your health care provider. °Document Revised: 05/04/2019 Document Reviewed: 05/04/2019 °Elsevier Patient Education © 2022 [employer] ° °

## 2021-07-10 NOTE — Assessment & Plan Note (Signed)
Followed by [facility], continue this collaboration and medications as ordered by them.

## 2021-07-10 NOTE — Assessment & Plan Note (Signed)
Chronic, stable with BP at goal today.  Recommend she monitor BP at least a few mornings a week at home and document.  DASH diet at home.  Continue current medication regimen and adjust as needed, refills sent in.  Labs at physical on Monday.

## 2021-07-13 ENCOUNTER — Other Ambulatory Visit: Payer: Self-pay

## 2021-07-13 ENCOUNTER — Ambulatory Visit (INDEPENDENT_AMBULATORY_CARE_PROVIDER_SITE_OTHER): Payer: No Typology Code available for payment source | Admitting: Nurse Practitioner

## 2021-07-13 ENCOUNTER — Encounter: Payer: Self-pay | Admitting: Nurse Practitioner

## 2021-07-13 VITALS — BP 122/81 | HR 116 | Temp 98.2°F | Ht 62.01 in | Wt 226.0 lb

## 2021-07-13 DIAGNOSIS — F1120 Opioid dependence, uncomplicated: Secondary | ICD-10-CM | POA: Diagnosis not present

## 2021-07-13 DIAGNOSIS — Z0000 Encounter for general adult medical examination without abnormal findings: Secondary | ICD-10-CM | POA: Diagnosis not present

## 2021-07-13 DIAGNOSIS — Z114 Encounter for screening for human immunodeficiency virus [HIV]: Secondary | ICD-10-CM

## 2021-07-13 DIAGNOSIS — Z1159 Encounter for screening for other viral diseases: Secondary | ICD-10-CM | POA: Diagnosis not present

## 2021-07-13 DIAGNOSIS — E559 Vitamin D deficiency, unspecified: Secondary | ICD-10-CM

## 2021-07-13 DIAGNOSIS — Z1231 Encounter for screening mammogram for malignant neoplasm of breast: Secondary | ICD-10-CM

## 2021-07-13 DIAGNOSIS — F419 Anxiety disorder, unspecified: Secondary | ICD-10-CM

## 2021-07-13 DIAGNOSIS — J4520 Mild intermittent asthma, uncomplicated: Secondary | ICD-10-CM | POA: Diagnosis not present

## 2021-07-13 DIAGNOSIS — Z6841 Body Mass Index (BMI) 40.0 and over, adult: Secondary | ICD-10-CM

## 2021-07-13 DIAGNOSIS — I10 Essential (primary) hypertension: Secondary | ICD-10-CM | POA: Diagnosis not present

## 2021-07-13 DIAGNOSIS — F17210 Nicotine dependence, cigarettes, uncomplicated: Secondary | ICD-10-CM

## 2021-07-13 LAB — MICROALBUMIN, URINE WAIVED
Creatinine, Urine Waived: 50 mg/dL (ref 10–300)
Microalb, Ur Waived: 10 mg/L (ref 0–19)
Microalb/Creat Ratio: <30 mg/g (ref <30)

## 2021-07-13 NOTE — Assessment & Plan Note (Signed)
Chronic, stable with minimal Albuterol use.  Recommend complete cessation of smoking.  Notify provider if increased Albuterol use.  CBC today. 

## 2021-07-13 NOTE — Assessment & Plan Note (Signed)
BMI 41.32.  Recommended eating smaller high protein, low fat meals more frequently and exercising 30 mins a day 5 times a week with a goal of 10-15lb weight loss in the next 3 months. Patient voiced their understanding and motivation to adhere to these recommendations.  

## 2021-07-13 NOTE — Assessment & Plan Note (Signed)
I have recommended complete cessation of tobacco use. I have discussed various options available for assistance with tobacco cessation including over the counter methods (Nicotine gum, patch and lozenges). We also discussed prescription options (Chantix, Nicotine Inhaler / Nasal Spray). The patient is not interested in pursuing any prescription tobacco cessation options at this time.  

## 2021-07-13 NOTE — Assessment & Plan Note (Signed)
History of low levels, check today and initiate supplement as needed.

## 2021-07-13 NOTE — Assessment & Plan Note (Signed)
Chronic, stable with BP at goal today.  Recommend she monitor BP at least a few mornings a week at home and document.  DASH diet at home.  Continue current medication regimen and adjust as needed.  LABS: CBC, CMP, TSH, urine ALB today.  Return in 6 months.

## 2021-07-13 NOTE — Progress Notes (Signed)
BP 122/81    Pulse (!) 116    Temp 98.2 F (36.8 C) (Oral)    Ht 5' 2.01" (1.575 m)    Wt 226 lb (102.5 kg)    LMP 02/25/2009 (Approximate)    SpO2 98%    BMI 41.32 kg/m    Subjective:    Patient ID: Holly French, female    DOB: 09-30-1975, 46 y.o.   MRN: [id number]  HPI: Holly French is a 46 y.o. female presenting on 07/13/2021 for comprehensive medical examination. Current medical complaints include:none  She currently lives with: husband Menopausal Symptoms: no  HYPERTENSION Continues on Losartan 100 MG.  Continues to smoke about 1/2 PPD, started smoking off and on since age 55.   Has asthma and minimally uses Albuterol. Hypertension status: stable  Satisfied with current treatment? yes Duration of hypertension: chronic BP monitoring frequency: none BP range:  BP medication side effects:  no Medication compliance: good compliance Aspirin: no Recurrent headaches: no Visual changes: no Palpitations: no Dyspnea: no Chest pain: no Lower extremity edema: no Dizzy/lightheaded: no    BACK PAIN Followed by [facility] due to chronic pain after her past lower back surgery in 2011.  She is currently being given cortisone injections + obtains Cymbalta, Amitriptyline, Norco, and Zanaflex.   Taking Melatonin to help her sleep at night.  Alleviating factors: APAP, narcotics, and muscle relaxer Status: stable Treatments attempted:  as above and APAP  Relief with NSAIDs?: mild Nighttime pain:  no Paresthesias / decreased sensation:  occasional to right leg due to nerve damage there Bowel / bladder incontinence:  no Fevers:  no Dysuria / urinary frequency:  no   Depression Screen done today and results listed below:  Depression screen [facility] 2/9 07/13/2021 07/10/2021 04/27/2021 07/17/2020 02/02/2019  Decreased Interest 1 1 1  0 0  Down, Depressed, Hopeless 1 0 1 0 0  PHQ - 2 Score 2 1 2  0 0  Altered sleeping 2 2 2  0 2  Tired, decreased energy 2 2 2  0 2  Change  in appetite 1 0 0 0 1  Feeling bad or failure about yourself  1 0 0 0 1  Trouble concentrating 0 1 0 0 1  Moving slowly or fidgety/restless 0 0 0 0 0  Suicidal thoughts 0 0 0 0 0  PHQ-9 Score 8 6 6  0 7  Difficult doing work/chores - - Somewhat difficult - Somewhat difficult    Fall Risk 07/17/2020 04/27/2021 07/10/2021 07/13/2021 07/13/2021  Falls in the past year? 0 0 0 0 0  Number of falls in past year - - - - -  Was there an injury with Fall? - 0 0 0 0  Fall Risk Category Calculator - 0 0 0 0  Fall Risk Category - Low Low Low Low  Patient Fall Risk Level - - Low fall risk Low fall risk Low fall risk  Patient at Risk for Falls Due to - No Fall Risks No Fall Risks No Fall Risks No Fall Risks  Fall risk Follow up - Falls evaluation completed Falls evaluation completed Falls evaluation completed Falls evaluation completed    Functional Status Survey: Is the patient deaf or have difficulty hearing?: No Does the patient have difficulty seeing, even when wearing glasses/contacts?: No Does the patient have difficulty concentrating, remembering, or making decisions?: No Does the patient have difficulty walking or climbing stairs?: No Does the patient have difficulty dressing or bathing?: No Does the patient have  difficulty doing errands alone such as visiting a doctor's office or shopping?: No    Past Medical History:  Past Medical History:  Diagnosis Date   Allergy    Asthma    Chronic urticaria    Dermatitis    Eczema    Hayfever    Hypertension    Vitamin D deficiency     Surgical History:  Past Surgical History:  Procedure Laterality Date   ABDOMINAL HYSTERECTOMY     EPF and gastroc recession Right 01/14/2016   Per MD notes   laparoscopies     x3   NASAL SINUS SURGERY     x3   PLANTAR FASCIA RELEASE     SPINAL FUSION     lumbar    Medications:  Current Outpatient Medications on File Prior to Visit  Medication Sig   albuterol (VENTOLIN HFA) 108 (90 Base) MCG/ACT  inhaler Inhale 2 puffs into the lungs every 6 (six) hours as needed for wheezing or shortness of breath.   amitriptyline (ELAVIL) 50 MG tablet Take 50 mg by mouth at bedtime.   DULoxetine (CYMBALTA) 30 MG capsule Take 30 mg by mouth 2 (two) times daily.    famotidine (PEPCID) 10 MG tablet Take 10 mg by mouth daily.   fexofenadine (ALLEGRA ALLERGY) 180 MG tablet Take 1 tablet (180 mg total) by mouth daily.   HYDROcodone-acetaminophen (NORCO) 7.5-325 MG tablet Take 1 tablet by mouth every 8 (eight) hours as needed for moderate pain.   losartan (COZAAR) 50 MG tablet Take 2 tablets (100 mg total) by mouth daily.   Melatonin 10 MG CAPS Take by mouth at bedtime as needed.    tiZANidine (ZANAFLEX) 4 MG tablet Take 4 mg by mouth 3 (three) times daily.   triamcinolone ointment (KENALOG) 0.5 % Apply 1 application topically 2 (two) times daily.   No current facility-administered medications on file prior to visit.    Allergies:  Allergies  Allergen Reactions   Covid-19 (Mrna) Vaccine Therapist, music) [Covid-19 (Mrna) Vaccine]    Neosporin [Neomycin-Bacitracin Zn-Polymyx] Dermatitis   Latex Rash    Social History:  Social History   Socioeconomic History   Marital status: Married    Spouse name: Not on file   Number of children: Not on file   Years of education: Not on file   Highest education level: Not on file  Occupational History   Not on file  Tobacco Use   Smoking status: Every Day    Packs/day: 0.25    Years: 18.00    Pack years: 4.50    Types: Cigarettes   Smokeless tobacco: Never  Vaping Use   Vaping Use: Never used  Substance and Sexual Activity   Alcohol use: No    Alcohol/week: 0.0 standard drinks   Drug use: No   Sexual activity: Yes  Other Topics Concern   Not on file  Social History Narrative   Not on file   Social Determinants of Health   Financial Resource Strain: Low Risk    Difficulty of Paying Living Expenses: Not hard at all  Food Insecurity: No Food Insecurity    Worried About Charity fundraiser in the Last Year: Never true   Ran Out of Food in the Last Year: Never true  Transportation Needs: No Transportation Needs   Lack of Transportation (Medical): No   Lack of Transportation (Non-Medical): No  Physical Activity: Inactive   Days of Exercise per Week: 0 days   Minutes of Exercise per Session: 0  min  Stress: No Stress Concern Present   Feeling of Stress : Only a little  Social Connections: Moderately Isolated   Frequency of Communication with Friends and Family: More than three times a week   Frequency of Social Gatherings with Friends and Family: More than three times a week   Attends Religious Services: Never   Marine scientist or Organizations: No   Attends Music therapist: Never   Marital Status: Married  Human resources officer Violence: Not At Risk   Fear of Current or Ex-Partner: No   Emotionally Abused: No   Physically Abused: No   Sexually Abused: No   Social History   Tobacco Use  Smoking Status Every Day   Packs/day: 0.25   Years: 18.00   Pack years: 4.50   Types: Cigarettes  Smokeless Tobacco Never   Social History   Substance and Sexual Activity  Alcohol Use No   Alcohol/week: 0.0 standard drinks    Family History:  Family History  Problem Relation Age of Onset   Diabetes Mother    Hyperlipidemia Father    Allergies Daughter    COPD Maternal Grandmother    Lupus Maternal Grandmother    Cancer Maternal Grandfather        colon    Past medical history, surgical history, medications, allergies, family history and social history reviewed with patient today and changes made to appropriate areas of the chart.   ROS All other ROS negative except what is listed above and in the HPI.      Objective:    BP 122/81    Pulse (!) 116    Temp 98.2 F (36.8 C) (Oral)    Ht 5' 2.01" (1.575 m)    Wt 226 lb (102.5 kg)    LMP 02/25/2009 (Approximate)    SpO2 98%    BMI 41.32 kg/m   Wt Readings from  Last 3 Encounters:  07/13/21 226 lb (102.5 kg)  07/10/21 226 lb 9.6 oz (102.8 kg)  04/27/21 225 lb (102.1 kg)    Physical Exam Vitals and nursing note reviewed. Exam conducted with a chaperone present.  Constitutional:      General: She is awake. She is not in acute distress.    Appearance: She is well-developed. She is not ill-appearing.  HENT:     Head: Normocephalic and atraumatic.     Right Ear: Hearing, tympanic membrane, ear canal and external ear normal. No drainage.     Left Ear: Hearing, tympanic membrane, ear canal and external ear normal. No drainage.     Nose: Nose normal.     Right Sinus: No maxillary sinus tenderness or frontal sinus tenderness.     Left Sinus: No maxillary sinus tenderness or frontal sinus tenderness.     Mouth/Throat:     Mouth: Mucous membranes are moist.     Pharynx: Oropharynx is clear. Uvula midline. No pharyngeal swelling, oropharyngeal exudate or posterior oropharyngeal erythema.  Eyes:     General: Lids are normal.        Right eye: No discharge.        Left eye: No discharge.     Extraocular Movements: Extraocular movements intact.     Conjunctiva/sclera: Conjunctivae normal.     Pupils: Pupils are equal, round, and reactive to light.     Visual Fields: Right eye visual fields normal and left eye visual fields normal.  Neck:     Thyroid: No thyromegaly.     Vascular: No carotid  bruit.     Trachea: Trachea normal.  Cardiovascular:     Rate and Rhythm: Normal rate and regular rhythm.     Heart sounds: Normal heart sounds. No murmur heard.   No gallop.  Pulmonary:     Effort: Pulmonary effort is normal. No accessory muscle usage or respiratory distress.     Breath sounds: Normal breath sounds.  Chest:  Breasts:    Right: Normal.     Left: Normal.  Abdominal:     General: Bowel sounds are normal.     Palpations: Abdomen is soft. There is no hepatomegaly or splenomegaly.     Tenderness: There is no abdominal tenderness.   Musculoskeletal:        General: Normal range of motion.     Cervical back: Normal range of motion and neck supple.     Right lower leg: No edema.     Left lower leg: No edema.  Lymphadenopathy:     Head:     Right side of head: No submental, submandibular, tonsillar, preauricular or posterior auricular adenopathy.     Left side of head: No submental, submandibular, tonsillar, preauricular or posterior auricular adenopathy.     Cervical: No cervical adenopathy.     Upper Body:     Right upper body: No supraclavicular, axillary or pectoral adenopathy.     Left upper body: No supraclavicular, axillary or pectoral adenopathy.  Skin:    General: Skin is warm and dry.     Capillary Refill: Capillary refill takes less than 2 seconds.     Findings: No rash.  Neurological:     Mental Status: She is alert and oriented to person, place, and time.     Gait: Gait is intact.     Deep Tendon Reflexes: Reflexes are normal and symmetric.     Reflex Scores:      Brachioradialis reflexes are 2+ on the right side and 2+ on the left side.      Patellar reflexes are 2+ on the right side and 2+ on the left side. Psychiatric:        Attention and Perception: Attention normal.        Mood and Affect: Mood normal.        Speech: Speech normal.        Behavior: Behavior normal. Behavior is cooperative.        Thought Content: Thought content normal.        Judgment: Judgment normal.   Results for orders placed or performed in visit on 04/27/21  Novel Coronavirus, NAA ([facility])   Specimen: Nasopharyngeal(NP) swabs in vial transport medium  Result Value Ref Range   SARS-CoV-2, NAA Not Detected Not Detected  Rapid Strep Screen ([facility] ONLY)   Naso  Result Value Ref Range   Strep Gp A Ag, IA W/Reflex Negative Negative  Culture, Group A Strep   Naso  Result Value Ref Range   Strep A Culture Comment (A)   SARS-COV-2, NAA 2 DAY TAT  Result Value Ref Range   SARS-CoV-2, NAA 2 DAY TAT Performed    Veritor Flu A/B Waived  Result Value Ref Range   Influenza A Negative Negative   Influenza B Negative Negative      Assessment & Plan:   Problem List Items Addressed This Visit       Cardiovascular and Mediastinum   Hypertension    Chronic, stable with BP at goal today.  Recommend she monitor BP at least a  few mornings a week at home and document.  DASH diet at home.  Continue current medication regimen and adjust as needed.  LABS: CBC, CMP, TSH, urine ALB today.  Return in 6 months.      Relevant Orders   Microalbumin, Urine Waived   Comprehensive metabolic panel   CBC with Differential/Platelet   Lipid Panel w/o Chol/HDL Ratio   TSH     Respiratory   Asthma    Chronic, stable with minimal Albuterol use.  Recommend complete cessation of smoking.  Notify provider if increased Albuterol use.  CBC today.      Relevant Orders   CBC with Differential/Platelet     Other   Continuous opioid dependence (HCC) - Primary    Followed by [facility], continue this collaboration and medications as ordered by them.      Nicotine dependence, cigarettes, uncomplicated    I have recommended complete cessation of tobacco use. I have discussed various options available for assistance with tobacco cessation including over the counter methods (Nicotine gum, patch and lozenges). We also discussed prescription options (Chantix, Nicotine Inhaler / Nasal Spray). The patient is not interested in pursuing any prescription tobacco cessation options at this time.       Obesity    BMI 41.32.  Recommended eating smaller high protein, low fat meals more frequently and exercising 30 mins a day 5 times a week with a goal of 10-15lb weight loss in the next 3 months. Patient voiced their understanding and motivation to adhere to these recommendations.       Vitamin D deficiency    History of low levels, check today and initiate supplement as needed.      Relevant Orders   VITAMIN D 25  Hydroxy (Vit-D Deficiency, Fractures)   Other Visit Diagnoses     Need for hepatitis C screening test       Hep C screening on labs today, discussed with patient.   Relevant Orders   Hepatitis C antibody   Encounter for screening for HIV       HIV screening on labs today, discussed with patient.   Relevant Orders   HIV Antibody (routine testing w rflx)   Encounter for screening mammogram for malignant neoplasm of breast       Mammogram ordered today -- she wishes to start screening   Relevant Orders   MM 3D SCREEN BREAST BILATERAL   Encounter for annual physical exam       Annual physical with labs today and health maintenance reviewed.        Follow up plan: No follow-ups on file.   LABORATORY TESTING:  - Pap smear: up to date  IMMUNIZATIONS:   - Tdap: Tetanus vaccination status reviewed: last tetanus booster within 10 years. - Influenza: Up to date - Pneumovax: Not applicable - Prevnar: Not applicable - COVID: Up to date x 2 - HPV: Not applicable - Shingrix vaccine: Not applicable  SCREENING: -Mammogram: Ordered today  - Colonoscopy: Not applicable  - Bone Density: Not applicable  -Hearing Test: Not applicable  -Spirometry: Not applicable   PATIENT COUNSELING:   Advised to take 1 mg of folate supplement per day if capable of pregnancy.   Sexuality: Discussed sexually transmitted diseases, partner selection, use of condoms, avoidance of unintended pregnancy  and contraceptive alternatives.   Advised to avoid cigarette smoking.  I discussed with the patient that most people either abstain from alcohol or drink within safe limits (<=14/week and <=4 drinks/occasion for  males, <=7/weeks and <= 3 drinks/occasion for females) and that the risk for alcohol disorders and other health effects rises proportionally with the number of drinks per week and how often a drinker exceeds daily limits.  Discussed cessation/primary prevention of drug use and availability of  treatment for abuse.   Diet: Encouraged to adjust caloric intake to maintain  or achieve ideal body weight, to reduce intake of dietary saturated fat and total fat, to limit sodium intake by avoiding high sodium foods and not adding table salt, and to maintain adequate dietary potassium and calcium preferably from fresh fruits, vegetables, and low-fat dairy products.    Stressed the importance of regular exercise  Injury prevention: Discussed safety belts, safety helmets, smoke detector, smoking near bedding or upholstery.   Dental health: Discussed importance of regular tooth brushing, flossing, and dental visits.    NEXT PREVENTATIVE PHYSICAL DUE IN 1 YEAR. No follow-ups on file.

## 2021-07-13 NOTE — Assessment & Plan Note (Signed)
Followed by [facility], continue this collaboration and medications as ordered by them. °

## 2021-07-14 ENCOUNTER — Other Ambulatory Visit: Payer: Self-pay | Admitting: Nurse Practitioner

## 2021-07-14 LAB — COMPREHENSIVE METABOLIC PANEL
ALT: 18 IU/L (ref 0–32)
AST: 18 IU/L (ref 0–40)
Albumin/Globulin Ratio: 2.1 (ref 1.2–2.2)
Albumin: 4.5 g/dL (ref 3.8–4.8)
Alkaline Phosphatase: 95 IU/L (ref 44–121)
BUN/Creatinine Ratio: 13 (ref 9–23)
BUN: 10 mg/dL (ref 6–24)
Bilirubin Total: 0.2 mg/dL (ref 0.0–1.2)
CO2: 21 mmol/L (ref 20–29)
Calcium: 9.2 mg/dL (ref 8.7–10.2)
Chloride: 101 mmol/L (ref 96–106)
Creatinine, Ser: 0.76 mg/dL (ref 0.57–1.00)
Globulin, Total: 2.1 g/dL (ref 1.5–4.5)
Glucose: 110 mg/dL — ABNORMAL HIGH (ref 70–99)
Potassium: 4 mmol/L (ref 3.5–5.2)
Sodium: 137 mmol/L (ref 134–144)
Total Protein: 6.6 g/dL (ref 6.0–8.5)
eGFR: 100 mL/min/{1.73_m2} (ref >59)

## 2021-07-14 LAB — CBC WITH DIFFERENTIAL/PLATELET
Basophils Absolute: 0.1 10*3/uL (ref 0.0–0.2)
Basos: 1 %
EOS (ABSOLUTE): 0.3 10*3/uL (ref 0.0–0.4)
Eos: 5 %
Hematocrit: 40 % (ref 34.0–46.6)
Hemoglobin: 13.6 g/dL (ref 11.1–15.9)
Immature Grans (Abs): 0 10*3/uL (ref 0.0–0.1)
Immature Granulocytes: 0 %
Lymphocytes Absolute: 1.8 10*3/uL (ref 0.7–3.1)
Lymphs: 34 %
MCH: 31.2 pg (ref 26.6–33.0)
MCHC: 34 g/dL (ref 31.5–35.7)
MCV: 92 fL (ref 79–97)
Monocytes Absolute: 0.3 10*3/uL (ref 0.1–0.9)
Monocytes: 6 %
Neutrophils Absolute: 2.9 10*3/uL (ref 1.4–7.0)
Neutrophils: 54 %
Platelets: 331 10*3/uL (ref 150–450)
RBC: 4.36 x10E6/uL (ref 3.77–5.28)
RDW: 12.1 % (ref 11.7–15.4)
WBC: 5.4 10*3/uL (ref 3.4–10.8)

## 2021-07-14 LAB — LIPID PANEL W/O CHOL/HDL RATIO
Cholesterol, Total: 226 mg/dL — ABNORMAL HIGH (ref 100–199)
HDL: 55 mg/dL (ref >39)
LDL Chol Calc (NIH): 138 mg/dL — ABNORMAL HIGH (ref 0–99)
Triglycerides: 186 mg/dL — ABNORMAL HIGH (ref 0–149)
VLDL Cholesterol Cal: 33 mg/dL (ref 5–40)

## 2021-07-14 LAB — VITAMIN D 25 HYDROXY (VIT D DEFICIENCY, FRACTURES): Vit D, 25-Hydroxy: 17.5 ng/mL — ABNORMAL LOW (ref 30.0–100.0)

## 2021-07-14 LAB — TSH: TSH: 1.48 u[IU]/mL (ref 0.450–4.500)

## 2021-07-14 LAB — HEPATITIS C ANTIBODY: Hep C Virus Ab: <0.1 s/co ratio (ref 0.0–0.9)

## 2021-07-14 LAB — HIV ANTIBODY (ROUTINE TESTING W REFLEX): HIV Screen 4th Generation wRfx: NONREACTIVE

## 2021-07-14 MED ORDER — CHOLECALCIFEROL 1.25 MG (50000 UT) PO TABS: 1.0000 | ORAL_TABLET | ORAL | 4 refills | Status: DC

## 2021-07-14 NOTE — Progress Notes (Signed)
Contacted via MyChart The 10-year ASCVD risk score (Arnett DK, et al., 2019) is: 3.9%   Values used to calculate the score:     Age: 46 years     Sex: Female     Is Non-Hispanic African American: No     Diabetic: No     Tobacco smoker: Yes     Systolic Blood Pressure: 122 mmHg     Is BP treated: Yes     HDL Cholesterol: 55 mg/dL     Total Cholesterol: 226 mg/dL

## 2021-07-22 ENCOUNTER — Telehealth: Payer: No Typology Code available for payment source | Admitting: Nurse Practitioner

## 2021-07-22 DIAGNOSIS — K120 Recurrent oral aphthae: Secondary | ICD-10-CM | POA: Diagnosis not present

## 2021-07-22 NOTE — Progress Notes (Signed)
E-Visit for Mouth Ulcers  We are sorry that you are not feeling well.  Here is how we plan to help!  Based on what you have shared with me, it appears that you do have mouth ulcer(s).     The following medications should decrease the discomfort and help with healing. Biotene mouthwash 3 times daily (Available over the counter)   Mouth ulcers are painful areas in the mouth and gums. These are also known as canker sores.  They can occur anywhere inside the mouth. While mostly harmless, mouth ulcers can be extremely uncomfortable and may make it difficult to eat, drink, and brush your teeth.  You may have more than 1 ulcer and they can vary and change in size. Mouth ulcers are not contagious and should not be confused with cold sores.  Cold sores appear on the lip or around the outside of the mouth and often begin with a tingling, burning or itching sensation.   While the exact causes are unknown, some common causes and factors that may aggravate mouth ulcers include: Genetics - Sometimes mouth ulcers run in families High alcohol intake Acidic foods such as citrus fruits like pineapple, grapefruit, orange fruits/juices, may aggravate mouth ulcers Other foods high in acidity or spice such as coffee, chocolate, chips, pretzels, eggs, nuts, cheese Quitting smoking Injury caused by biting the tongue or inside of the cheek Diet lacking in B-12, zinc, folic acid or iron Female hormone shifts with menstruation Excessive fatigue, emotional stress or anxiety Prevention: Talk to your doctor if you are taking meds that are known to cause mouth ulcers such as:   Anti-inflammatory drugs (for example Ibuprofen, Naproxen sodium), pain killers, Beta blockers, Oral nicotine replacement drugs, Some street drugs (heroin).   Avoid allowing any tablets to dissolve in your mouth that are meant to swallowed whole Avoid foods/drinks that trigger or worsen symptoms Keep your mouth clean with daily brushing and  flossing  Home Care: The goal with treatment is to ease the pain where ulcers occur and help them heal as quickly as possible.  There is no medical treatment to prevent mouth ulcers from coming back or recurring.  Avoid spicy and acidic foods Eat soft foods and avoid rough, crunchy foods Avoid chewing gum Do not use toothpaste that contains sodium lauryl sulphite Use a straw to drink which helps avoid liquids toughing the ulcers near the front of your mouth Use a very soft toothbrush If you have dentures or dental hardware that you feel is not fitting well or contributing to his, please see your dentist. Use saltwater mouthwash which helps healing. Dissolve a  teaspoon of salt in a glass of warm water. Swish around your mouth and spit it out. This can be used as needed if it is soothing.   GET HELP RIGHT AWAY IF: Persistent ulcers require checking IN PERSON (face to face). Any mouth lesion lasting longer than a month should be seen by your DENTIST as soon as possible for evaluation for possible oral cancer. If you have a non-painful ulcer in 1 or more areas of your mouth Ulcers that are spreading, are very large or particularly painful Ulcers last longer than one week without improving on treatment If you develop a fever, swollen glands and begin to feel unwell Ulcers that developed after starting a new medication MAKE SURE YOU: Understand these instructions. Will watch your condition. Will get help right away if you are not doing well or get worse.  Thank you for choosing  an e-visit.  Your e-visit answers were reviewed by a board certified advanced clinical practitioner to complete your personal care plan. Depending upon the condition, your plan could have included both over the counter or prescription medications.  Please review your pharmacy choice. Make sure the pharmacy is open so you can pick up prescription now. If there is a problem, you may contact your provider through [employer] and have the prescription routed to another pharmacy.  Your safety is important to [place]. If you have drug allergies check your prescription carefully.   For the next 24 hours you can use MyChart to ask questions about today's visit, request a non-urgent call back, or ask for a work or school excuse. You will get an email in the next two days asking about your experience. I hope that your e-visit has been valuable and will speed your recovery.  5-10 minutes spent reviewing and documenting in chart.

## 2021-08-11 ENCOUNTER — Ambulatory Visit (INDEPENDENT_AMBULATORY_CARE_PROVIDER_SITE_OTHER): Payer: No Typology Code available for payment source | Admitting: Internal Medicine

## 2021-08-11 ENCOUNTER — Other Ambulatory Visit: Payer: Self-pay

## 2021-08-11 ENCOUNTER — Encounter: Payer: Self-pay | Admitting: Internal Medicine

## 2021-08-11 VITALS — BP 117/70 | HR 103 | Temp 98.1°F | Ht 62.01 in | Wt 225.0 lb

## 2021-08-11 DIAGNOSIS — J029 Acute pharyngitis, unspecified: Secondary | ICD-10-CM | POA: Insufficient documentation

## 2021-08-11 DIAGNOSIS — R519 Headache, unspecified: Secondary | ICD-10-CM | POA: Insufficient documentation

## 2021-08-11 DIAGNOSIS — R0981 Nasal congestion: Secondary | ICD-10-CM | POA: Insufficient documentation

## 2021-08-11 DIAGNOSIS — J329 Chronic sinusitis, unspecified: Secondary | ICD-10-CM | POA: Insufficient documentation

## 2021-08-11 MED ORDER — METHYLPREDNISOLONE 4 MG PO TBPK: ORAL_TABLET | ORAL | 0 refills | Status: DC

## 2021-08-11 MED ORDER — AMOXICILLIN-POT CLAVULANATE 875-125 MG PO TABS: 1.0000 | ORAL_TABLET | Freq: Two times a day (BID) | ORAL | 0 refills | Status: DC

## 2021-08-11 MED ORDER — FEXOFENADINE HCL 180 MG PO TABS: 180.0000 mg | ORAL_TABLET | Freq: Every day | ORAL | 1 refills | Status: DC

## 2021-08-11 NOTE — Progress Notes (Signed)
BP 117/70    Pulse (!) 103    Temp 98.1 F (36.7 C) (Oral)    Ht 5' 2.01" (1.575 m)    Wt 225 lb (102.1 kg)    LMP 02/25/2009 (Approximate)    SpO2 97%    BMI 41.14 kg/m    Subjective:    Patient ID: Holly French, female    DOB: [birth year], 46 y.o.   MRN: [id number]  Chief Complaint  Patient presents with   Sinus presssure    Started on Thursday and is progressively getting worse. Has been taking OTC Mucinex    HPI: AHSLEY French is a 46 y.o. female  Sinus Problem This is a new problem. The current episode started 1 to 4 weeks ago. There has been no fever. Her pain is at a severity of 5/10. Associated symptoms include congestion, coughing, headaches, sinus pressure and a sore throat. Pertinent negatives include no chills, diaphoresis, ear pain, hoarse voice, neck pain, shortness of breath, sneezing or swollen glands.   Chief Complaint  Patient presents with   Sinus presssure    Started on Thursday and is progressively getting worse. Has been taking OTC Mucinex    Relevant past medical, surgical, family and social history reviewed and updated as indicated. Interim medical history since our last visit reviewed. Allergies and medications reviewed and updated.  Review of Systems  Constitutional:  Negative for chills and diaphoresis.  HENT:  Positive for congestion, sinus pressure and sore throat. Negative for ear pain, hoarse voice and sneezing.   Respiratory:  Positive for cough. Negative for shortness of breath.   Musculoskeletal:  Negative for neck pain.  Neurological:  Positive for headaches.   Per HPI unless specifically indicated above     Objective:    BP 117/70    Pulse (!) 103    Temp 98.1 F (36.7 C) (Oral)    Ht 5' 2.01" (1.575 m)    Wt 225 lb (102.1 kg)    LMP 02/25/2009 (Approximate)    SpO2 97%    BMI 41.14 kg/m   Wt Readings from Last 3 Encounters:  08/11/21 225 lb (102.1 kg)  07/13/21 226 lb (102.5 kg)  07/10/21 226 lb 9.6 oz (102.8 kg)    Physical  Exam Vitals and nursing note reviewed.  Constitutional:      General: She is not in acute distress.    Appearance: Normal appearance. She is not ill-appearing or diaphoretic.  HENT:     Head:     Comments: Tenderness to touch on maxillary sinuses  Eyes:     Conjunctiva/sclera: Conjunctivae normal.  Cardiovascular:     Rate and Rhythm: Normal rate and regular rhythm.  Pulmonary:     Effort: No respiratory distress.     Breath sounds: No stridor. Wheezing present. No rhonchi or rales.  Chest:     Chest wall: No tenderness.  Abdominal:     General: There is no distension.     Palpations: There is no mass.     Tenderness: There is no abdominal tenderness. There is no guarding.     Hernia: No hernia is present.  Skin:    General: Skin is warm and dry.     Coloration: Skin is not jaundiced.     Findings: No erythema.  Neurological:     Mental Status: She is alert.    Results for orders placed or performed in visit on 07/13/21  Microalbumin, Urine Waived  Result Value Ref Range  Microalb, Ur Waived 10 0 - 19 mg/L   Creatinine, Urine Waived 50 10 - 300 mg/dL   Microalb/Creat Ratio <30 <30 mg/g  Comprehensive metabolic panel  Result Value Ref Range   Glucose 110 (H) 70 - 99 mg/dL   BUN 10 6 - 24 mg/dL   Creatinine, Ser 0.76 0.57 - 1.00 mg/dL   eGFR 100 >59 mL/min/1.73   BUN/Creatinine Ratio 13 9 - 23   Sodium 137 134 - 144 mmol/L   Potassium 4.0 3.5 - 5.2 mmol/L   Chloride 101 96 - 106 mmol/L   CO2 21 20 - 29 mmol/L   Calcium 9.2 8.7 - 10.2 mg/dL   Total Protein 6.6 6.0 - 8.5 g/dL   Albumin 4.5 3.8 - 4.8 g/dL   Globulin, Total 2.1 1.5 - 4.5 g/dL   Albumin/Globulin Ratio 2.1 1.2 - 2.2   Bilirubin Total 0.2 0.0 - 1.2 mg/dL   Alkaline Phosphatase 95 44 - 121 IU/L   AST 18 0 - 40 IU/L   ALT 18 0 - 32 IU/L  CBC with Differential/Platelet  Result Value Ref Range   WBC 5.4 3.4 - 10.8 x10E3/uL   RBC 4.36 3.77 - 5.28 x10E6/uL   Hemoglobin 13.6 11.1 - 15.9 g/dL   Hematocrit  40.0 34.0 - 46.6 %   MCV 92 79 - 97 fL   MCH 31.2 26.6 - 33.0 pg   MCHC 34.0 31.5 - 35.7 g/dL   RDW 12.1 11.7 - 15.4 %   Platelets 331 150 - 450 x10E3/uL   Neutrophils 54 Not Estab. %   Lymphs 34 Not Estab. %   Monocytes 6 Not Estab. %   Eos 5 Not Estab. %   Basos 1 Not Estab. %   Neutrophils Absolute 2.9 1.4 - 7.0 x10E3/uL   Lymphocytes Absolute 1.8 0.7 - 3.1 x10E3/uL   Monocytes Absolute 0.3 0.1 - 0.9 x10E3/uL   EOS (ABSOLUTE) 0.3 0.0 - 0.4 x10E3/uL   Basophils Absolute 0.1 0.0 - 0.2 x10E3/uL   Immature Granulocytes 0 Not Estab. %   Immature Grans (Abs) 0.0 0.0 - 0.1 x10E3/uL  Lipid Panel w/o Chol/HDL Ratio  Result Value Ref Range   Cholesterol, Total 226 (H) 100 - 199 mg/dL   Triglycerides 186 (H) 0 - 149 mg/dL   HDL 55 >39 mg/dL   VLDL Cholesterol Cal 33 5 - 40 mg/dL   LDL Chol Calc (NIH) 138 (H) 0 - 99 mg/dL  TSH  Result Value Ref Range   TSH 1.480 0.450 - 4.500 uIU/mL  VITAMIN D 25 Hydroxy (Vit-D Deficiency, Fractures)  Result Value Ref Range   Vit D, 25-Hydroxy 17.5 (L) 30.0 - 100.0 ng/mL  Hepatitis C antibody  Result Value Ref Range   Hep C Virus Ab <0.1 0.0 - 0.9 s/co ratio  HIV Antibody (routine testing w rflx)  Result Value Ref Range   HIV Screen 4th Generation wRfx Non Reactive Non Reactive        Current Outpatient Medications:    albuterol (VENTOLIN HFA) 108 (90 Base) MCG/ACT inhaler, Inhale 2 puffs into the lungs every 6 (six) hours as needed for wheezing or shortness of breath., Disp: 8 g, Rfl: 0   amitriptyline (ELAVIL) 50 MG tablet, Take 50 mg by mouth at bedtime., Disp: , Rfl:    Cholecalciferol 1.25 MG (50000 UT) TABS, Take 1 tablet by mouth once a week., Disp: 12 tablet, Rfl: 4   DULoxetine (CYMBALTA) 30 MG capsule, Take 30 mg by mouth 2 (two)  times daily. , Disp: , Rfl: 0   famotidine (PEPCID) 10 MG tablet, Take 10 mg by mouth daily., Disp: , Rfl:    fexofenadine (ALLEGRA ALLERGY) 180 MG tablet, Take 1 tablet (180 mg total) by mouth daily., Disp:  10 tablet, Rfl: 1   HYDROcodone-acetaminophen (NORCO) 7.5-325 MG tablet, Take 1 tablet by mouth every 8 (eight) hours as needed for moderate pain., Disp: , Rfl:    losartan (COZAAR) 50 MG tablet, Take 2 tablets (100 mg total) by mouth daily., Disp: 180 tablet, Rfl: 4   Melatonin 10 MG CAPS, Take by mouth at bedtime as needed. , Disp: , Rfl:    tiZANidine (ZANAFLEX) 4 MG tablet, Take 4 mg by mouth 3 (three) times daily., Disp: , Rfl:    triamcinolone ointment (KENALOG) 0.5 %, Apply 1 application topically 2 (two) times daily., Disp: 90 g, Rfl: 0    Assessment & Plan:  Acute sinusitis  Will start pt on augmenitn  Albuterol nebs x 1 given to pt today @ office Start pt on medrol dose pak  pt advised to take Tylenol q 4- 6 hourly as needed. pt to take allegra q pm as needed and to call office if symptoms worsened pt verbalised understanding of such.     Problem List Items Addressed This Visit   None Visit Diagnoses     Nasal congestion    -  Primary   Relevant Orders   Novel Coronavirus, NAA ([facility])   Veritor Flu A/B Waived   Rapid Strep screen([facility])   Sore throat       Relevant Orders   Novel Coronavirus, NAA ([facility])   Veritor Flu A/B Waived   Rapid Strep screen([facility])   Headache above the eye region       Relevant Orders   Novel Coronavirus, NAA ([facility])   Veritor Flu A/B Waived   Rapid Strep screen([facility])        Orders Placed This Encounter  Procedures   Novel Coronavirus, NAA ([facility])   Rapid Strep screen([facility])   Veritor Flu A/B Waived     No orders of the defined types were placed in this encounter.    Follow up plan: No follow-ups on file.

## 2021-08-12 LAB — NOVEL CORONAVIRUS, NAA: SARS-CoV-2, NAA: NOT DETECTED

## 2021-08-14 LAB — CULTURE, GROUP A STREP: Strep A Culture: NEGATIVE

## 2021-08-14 LAB — VERITOR FLU A/B WAIVED
Influenza A: NEGATIVE
Influenza B: NEGATIVE

## 2021-08-14 LAB — RAPID STREP SCREEN (MED CTR MEBANE ONLY): Strep Gp A Ag, IA W/Reflex: NEGATIVE

## 2021-08-21 ENCOUNTER — Other Ambulatory Visit: Payer: Self-pay | Admitting: Internal Medicine

## 2021-08-21 DIAGNOSIS — R0989 Other specified symptoms and signs involving the circulatory and respiratory systems: Secondary | ICD-10-CM

## 2021-08-21 DIAGNOSIS — R059 Cough, unspecified: Secondary | ICD-10-CM

## 2021-08-21 DIAGNOSIS — J029 Acute pharyngitis, unspecified: Secondary | ICD-10-CM

## 2021-08-21 NOTE — Telephone Encounter (Signed)
Requested medication (s) are due for refill today: yes ? ?Requested medication (s) are on the active medication list: yes ? ?Last refill:  04/27/21 8 g 0 refills ? ?Future visit scheduled: yes in 4 months ? ?Notes to clinic:  08/11/21 HR 103 , how many refills do you want to allow ? ? ? ?  ?Requested Prescriptions  ?Pending Prescriptions Disp Refills  ? albuterol (VENTOLIN HFA) 108 (90 Base) MCG/ACT inhaler [Pharmacy Med Name: ALBUTEROL HFA 90 MCG INHALER] 8.5 g   ?  Sig: INHALE 2 PUFFS INTO THE LUNGS EVERY 6 HOURS AS NEEDED FOR WHEEZING OR SHORTNESS OF BREATH  ?  ? Pulmonology:  Beta Agonists 2 Passed - 08/21/2021 11:34 AM  ?  ?  Passed - Last BP in normal range  ?  BP Readings from Last 1 Encounters:  ?08/11/21 117/70  ?  ?  ?  ?  Passed - Last Heart Rate in normal range  ?  Pulse Readings from Last 1 Encounters:  ?08/11/21 (!) 103  ?  ?  ?  ?  Passed - Valid encounter within last 12 months  ?  Recent Outpatient Visits   ? ?      ? 1 week ago Sore throat  ? [facility] Vigg, Avanti, MD  ? 1 month ago Continuous opioid dependence (HCC)  ? [facility] Mahomet, Barnum T, NP  ? 1 month ago Class 3 severe obesity due to excess calories without serious comorbidity with body mass index (BMI) of 40.0 to 44.9 in adult [facility])  ? [facility] [place], Coleridge T, NP  ? 2 months ago Urticaria  ? [facility], Lauren A, NP  ? 3 months ago Cough in adult  ? [facility] Vigg, Avanti, MD  ? ?  ?  ?Future Appointments   ? ?        ? In 4 months Cannady, Dorie Rank, NP [employer]  ? ?  ? ?  ?  ?  ? ?

## 2021-09-30 ENCOUNTER — Ambulatory Visit (INDEPENDENT_AMBULATORY_CARE_PROVIDER_SITE_OTHER): Payer: No Typology Code available for payment source | Admitting: Family Medicine

## 2021-09-30 ENCOUNTER — Encounter: Payer: Self-pay | Admitting: Family Medicine

## 2021-09-30 VITALS — BP 111/78 | HR 102 | Temp 98.8°F | Ht 62.01 in | Wt 222.8 lb

## 2021-09-30 DIAGNOSIS — J0100 Acute maxillary sinusitis, unspecified: Secondary | ICD-10-CM | POA: Diagnosis not present

## 2021-09-30 MED ORDER — HYDROCOD POLI-CHLORPHE POLI ER 10-8 MG/5ML PO SUER: 5.0000 mL | Freq: Two times a day (BID) | ORAL | 0 refills | Status: DC | PRN

## 2021-09-30 MED ORDER — AMOXICILLIN-POT CLAVULANATE 875-125 MG PO TABS: 1.0000 | ORAL_TABLET | Freq: Two times a day (BID) | ORAL | 0 refills | Status: DC

## 2021-09-30 NOTE — Assessment & Plan Note (Signed)
Will treat with augmentin and tussionex. Call with any concerns or if not getting better. ?

## 2021-09-30 NOTE — Progress Notes (Signed)
? ?BP 111/78   Pulse (!) 102   Temp 98.8 ?F (37.1 ?C) (Oral)   Ht 5' 2.01" (1.575 m)   Wt 222 lb 12.8 oz (101.1 kg)   LMP 02/25/2009 (Approximate)   SpO2 97%   BMI 40.74 kg/m?   ? ?Subjective:  ? ? Patient ID: Holly French, female    DOB: [birth year], 46 y.o.   MRN: [id number] ? ?HPI: ?Holly French is a 46 y.o. female ? ?Chief Complaint  ?Patient presents with  ? Sinus Problem  ?  Patient states she is having some really bad congestion, drainage down the back of her throat and says she is coughing up brown phlegm. Patient states she has been dealing with this for two and half weeks. Patient states she has tried over the counter medication such as Sinex, Allerga-D and Flonase and she has tried nasal sprays. Patient states nothing is not helping her. Patient has a history of 3 sinus surgeries.  ? ?UPPER RESPIRATORY TRACT INFECTION ?Worst symptom: ?Fever: no ?Cough: yes ?Shortness of breath: no ?Wheezing: no ?Chest pain: no ?Chest tightness: yes ?Chest congestion: yes ?Nasal congestion: yes ?Runny nose: no ?Post nasal drip: yes ?Sneezing: no ?Sore throat: no ?Swollen glands: no ?Sinus pressure: yes ?Headache: yes ?Face pain: yes ?Toothache: no ?Ear pain: no  ?Ear pressure: no  ?Eyes red/itching:no ?Eye drainage/crusting: no  ?Vomiting: no ?Rash: no ?Fatigue: yes ?Sick contacts: yes ?Strep contacts: no  ?Context: worse ?Recurrent sinusitis: yes ?Relief with OTC cold/cough medications: no  ?Treatments attempted: cold/sinus, mucinex, anti-histamine, and pseudoephedrine  ? ? ?Relevant past medical, surgical, family and social history reviewed and updated as indicated. Interim medical history since our last visit reviewed. ?Allergies and medications reviewed and updated. ? ?Review of Systems  ?Constitutional:  Positive for fatigue and fever. Negative for activity change, appetite change, chills, diaphoresis and unexpected weight change.  ?HENT:  Positive for congestion, postnasal drip, sinus pressure, sinus pain  and sore throat. Negative for dental problem, drooling, ear discharge, ear pain, facial swelling, hearing loss, mouth sores, nosebleeds, rhinorrhea, sneezing, tinnitus and trouble swallowing.   ?Respiratory:  Positive for cough. Negative for apnea, choking, chest tightness, shortness of breath, wheezing and stridor.   ?Cardiovascular: Negative.   ?Gastrointestinal: Negative.   ?Musculoskeletal: Negative.   ?Neurological: Negative.   ?Psychiatric/Behavioral: Negative.    ? ?Per HPI unless specifically indicated above ? ?   ?Objective:  ?  ?BP 111/78   Pulse (!) 102   Temp 98.8 ?F (37.1 ?C) (Oral)   Ht 5' 2.01" (1.575 m)   Wt 222 lb 12.8 oz (101.1 kg)   LMP 02/25/2009 (Approximate)   SpO2 97%   BMI 40.74 kg/m?   ?Wt Readings from Last 3 Encounters:  ?09/30/21 222 lb 12.8 oz (101.1 kg)  ?08/11/21 225 lb (102.1 kg)  ?07/13/21 226 lb (102.5 kg)  ?  ?Physical Exam ?Vitals and nursing note reviewed.  ?Constitutional:   ?   General: She is not in acute distress. ?   Appearance: Normal appearance. She is obese. She is not ill-appearing, toxic-appearing or diaphoretic.  ?HENT:  ?   Head: Normocephalic and atraumatic.  ?   Right Ear: Tympanic membrane, ear canal and external ear normal.  ?   Left Ear: Tympanic membrane, ear canal and external ear normal.  ?   Nose: Congestion and rhinorrhea present.  ?   Mouth/Throat:  ?   Mouth: Mucous membranes are moist.  ?   Pharynx: Oropharynx is clear. No  oropharyngeal exudate or posterior oropharyngeal erythema.  ?Eyes:  ?   General: No scleral icterus.    ?   Right eye: No discharge.     ?   Left eye: No discharge.  ?   Extraocular Movements: Extraocular movements intact.  ?   Conjunctiva/sclera: Conjunctivae normal.  ?   Pupils: Pupils are equal, round, and reactive to light.  ?Cardiovascular:  ?   Rate and Rhythm: Normal rate and regular rhythm.  ?   Pulses: Normal pulses.  ?   Heart sounds: Normal heart sounds. No murmur heard. ?  No friction rub. No gallop.  ?Pulmonary:  ?    Effort: Pulmonary effort is normal. No respiratory distress.  ?   Breath sounds: Normal breath sounds. No stridor. No wheezing, rhonchi or rales.  ?Chest:  ?   Chest wall: No tenderness.  ?Musculoskeletal:     ?   General: Normal range of motion.  ?   Cervical back: Normal range of motion and neck supple.  ?Skin: ?   General: Skin is warm and dry.  ?   Capillary Refill: Capillary refill takes less than 2 seconds.  ?   Coloration: Skin is not jaundiced or pale.  ?   Findings: No bruising, erythema, lesion or rash.  ?Neurological:  ?   General: No focal deficit present.  ?   Mental Status: She is alert and oriented to person, place, and time. Mental status is at baseline.  ?Psychiatric:     ?   Mood and Affect: Mood normal.     ?   Behavior: Behavior normal.     ?   Thought Content: Thought content normal.     ?   Judgment: Judgment normal.  ? ? ?Results for orders placed or performed in visit on 08/11/21  ?Novel Coronavirus, NAA ([facility])  ? Specimen: Nasopharyngeal(NP) swabs in vial transport medium  ?Result Value Ref Range  ? SARS-CoV-2, NAA Not Detected Not Detected  ?Rapid Strep screen([facility])  ? Specimen: Other  ? Other  ?Result Value Ref Range  ? Strep Gp A Ag, IA W/Reflex Negative Negative  ?Culture, Group A Strep  ? Other  ?Result Value Ref Range  ? Strep A Culture Negative   ?Veritor Flu A/B Waived  ?Result Value Ref Range  ? Influenza A Negative Negative  ? Influenza B Negative Negative  ? ?   ?Assessment & Plan:  ? ?Problem List Items Addressed This Visit   ? ?  ? Respiratory  ? Sinusitis - Primary  ?  Will treat with augmentin and tussionex. Call with any concerns or if not getting better. ? ?  ?  ? Relevant Medications  ? amoxicillin-clavulanate (AUGMENTIN) 875-125 MG tablet  ? chlorpheniramine-HYDROcodone (TUSSIONEX PENNKINETIC ER) 10-8 MG/5ML  ?  ? ?Follow up plan: ?Return if symptoms worsen or fail to improve. ? ? ? ? ? ?

## 2021-10-06 ENCOUNTER — Ambulatory Visit (INDEPENDENT_AMBULATORY_CARE_PROVIDER_SITE_OTHER): Payer: No Typology Code available for payment source | Admitting: Family Medicine

## 2021-10-06 ENCOUNTER — Encounter: Payer: Self-pay | Admitting: Family Medicine

## 2021-10-06 VITALS — BP 113/74 | HR 121 | Temp 98.6°F | Wt 222.6 lb

## 2021-10-06 DIAGNOSIS — R0981 Nasal congestion: Secondary | ICD-10-CM | POA: Diagnosis not present

## 2021-10-06 MED ORDER — TRIAMCINOLONE ACETONIDE 40 MG/ML IJ SUSP: 40.0000 mg | Freq: Once | INTRAMUSCULAR | Status: AC

## 2021-10-06 MED ORDER — PREDNISONE 50 MG PO TABS: 50.0000 mg | ORAL_TABLET | Freq: Every day | ORAL | 0 refills | Status: DC

## 2021-10-06 NOTE — Progress Notes (Signed)
? ?BP 113/74   Pulse (!) 121   Temp 98.6 ?F (37 ?C)   Wt 222 lb 9.6 oz (101 kg)   LMP 02/25/2009 (Approximate)   SpO2 100%   BMI 40.70 kg/m?   ? ?Subjective:  ? ? Patient ID: Holly French, female    DOB: [birth year], 46 y.o.   MRN: [id number] ? ?HPI: ?Holly French is a 46 y.o. female ? ?Chief Complaint  ?Patient presents with  ? Sinusitis  ?  Patient states she is still having nasal congestion and sinus headache. Patient states she is not yet done with the antibiotic   ? ?UPPER RESPIRATORY TRACT INFECTION ?Duration:  ?Worst symptom: cogestion ?Fever: no ?Cough: no ?Shortness of breath: no ?Wheezing: no ?Chest pain: no ?Chest tightness: yes ?Chest congestion: no ?Nasal congestion: yes ?Runny nose: yes ?Post nasal drip: yes ?Sneezing: no ?Sore throat: no ?Swollen glands: no ?Sinus pressure: yes ?Headache: no ?Face pain: no ?Toothache: no ?Ear pain: no  ?Ear pressure: no  ?Eyes red/itching:no ?Eye drainage/crusting: no  ?Vomiting: no ?Rash: no ?Fatigue: yes ?Sick contacts: no ?Strep contacts: no  ?Context: stable ?Recurrent sinusitis: no ?Relief with OTC cold/cough medications: no  ?Treatments attempted: cold/sinus, mucinex, anti-histamine, pseudoephedrine, cough syrup, and antibiotics  ? ? ?Relevant past medical, surgical, family and social history reviewed and updated as indicated. Interim medical history since our last visit reviewed. ?Allergies and medications reviewed and updated. ? ?Review of Systems  ?Constitutional: Negative.   ?HENT:  Positive for congestion, postnasal drip, rhinorrhea, sinus pressure, sinus pain and sneezing. Negative for dental problem, drooling, ear discharge, ear pain, facial swelling, hearing loss, mouth sores, nosebleeds, sore throat, tinnitus, trouble swallowing and voice change.   ?Respiratory:  Positive for cough and shortness of breath. Negative for apnea, choking, chest tightness, wheezing and stridor.   ?Cardiovascular: Negative.   ?Gastrointestinal: Negative.    ?Musculoskeletal: Negative.   ?Psychiatric/Behavioral: Negative.    ? ?Per HPI unless specifically indicated above ? ?   ?Objective:  ?  ?BP 113/74   Pulse (!) 121   Temp 98.6 ?F (37 ?C)   Wt 222 lb 9.6 oz (101 kg)   LMP 02/25/2009 (Approximate)   SpO2 100%   BMI 40.70 kg/m?   ?Wt Readings from Last 3 Encounters:  ?10/06/21 222 lb 9.6 oz (101 kg)  ?09/30/21 222 lb 12.8 oz (101.1 kg)  ?08/11/21 225 lb (102.1 kg)  ?  ?Physical Exam ?Vitals and nursing note reviewed.  ?Constitutional:   ?   General: She is not in acute distress. ?   Appearance: Normal appearance. She is obese. She is not ill-appearing, toxic-appearing or diaphoretic.  ?HENT:  ?   Head: Normocephalic and atraumatic.  ?   Right Ear: Tympanic membrane, ear canal and external ear normal.  ?   Left Ear: Tympanic membrane, ear canal and external ear normal.  ?   Nose: Congestion present. No rhinorrhea.  ?   Mouth/Throat:  ?   Mouth: Mucous membranes are moist.  ?   Pharynx: Oropharynx is clear. No oropharyngeal exudate or posterior oropharyngeal erythema.  ?Eyes:  ?   General: No scleral icterus.    ?   Right eye: No discharge.     ?   Left eye: No discharge.  ?   Extraocular Movements: Extraocular movements intact.  ?   Conjunctiva/sclera: Conjunctivae normal.  ?   Pupils: Pupils are equal, round, and reactive to light.  ?Cardiovascular:  ?   Rate and Rhythm: Normal rate  and regular rhythm.  ?   Pulses: Normal pulses.  ?   Heart sounds: Normal heart sounds. No murmur heard. ?  No friction rub. No gallop.  ?Pulmonary:  ?   Effort: Pulmonary effort is normal. No respiratory distress.  ?   Breath sounds: Normal breath sounds. No stridor. No wheezing, rhonchi or rales.  ?Chest:  ?   Chest wall: No tenderness.  ?Musculoskeletal:     ?   General: Normal range of motion.  ?   Cervical back: Normal range of motion and neck supple.  ?Skin: ?   General: Skin is warm and dry.  ?   Capillary Refill: Capillary refill takes less than 2 seconds.  ?   Coloration:  Skin is not jaundiced or pale.  ?   Findings: No bruising, erythema, lesion or rash.  ?Neurological:  ?   General: No focal deficit present.  ?   Mental Status: She is alert and oriented to person, place, and time. Mental status is at baseline.  ?Psychiatric:     ?   Mood and Affect: Mood normal.     ?   Behavior: Behavior normal.     ?   Thought Content: Thought content normal.     ?   Judgment: Judgment normal.  ? ? ?Results for orders placed or performed in visit on 08/11/21  ?Novel Coronavirus, NAA ([facility])  ? Specimen: Nasopharyngeal(NP) swabs in vial transport medium  ?Result Value Ref Range  ? SARS-CoV-2, NAA Not Detected Not Detected  ?Rapid Strep screen([facility])  ? Specimen: Other  ? Other  ?Result Value Ref Range  ? Strep Gp A Ag, IA W/Reflex Negative Negative  ?Culture, Group A Strep  ? Other  ?Result Value Ref Range  ? Strep A Culture Negative   ?Veritor Flu A/B Waived  ?Result Value Ref Range  ? Influenza A Negative Negative  ? Influenza B Negative Negative  ? ?   ?Assessment & Plan:  ? ?Problem List Items Addressed This Visit   ?None ?Visit Diagnoses   ? ? Nasal congestion    -  Primary  ? Finish abx. Treat with prednisone. If not significantly better by Friday we will send in doxy in case of resistance.   ? Relevant Medications  ? triamcinolone acetonide (KENALOG-40) injection 40 mg  ? ?  ?  ? ?Follow up plan: ?Return if symptoms worsen or fail to improve. ? ? ? ? ? ?

## 2021-11-17 ENCOUNTER — Ambulatory Visit (INDEPENDENT_AMBULATORY_CARE_PROVIDER_SITE_OTHER): Payer: No Typology Code available for payment source | Admitting: Physician Assistant

## 2021-11-17 ENCOUNTER — Encounter: Payer: Self-pay | Admitting: Physician Assistant

## 2021-11-17 VITALS — BP 111/72 | HR 97 | Temp 97.6°F | Wt 226.4 lb

## 2021-11-17 DIAGNOSIS — F1120 Opioid dependence, uncomplicated: Secondary | ICD-10-CM | POA: Diagnosis not present

## 2021-11-17 DIAGNOSIS — M961 Postlaminectomy syndrome, not elsewhere classified: Secondary | ICD-10-CM | POA: Diagnosis not present

## 2021-11-17 DIAGNOSIS — F419 Anxiety disorder, unspecified: Secondary | ICD-10-CM

## 2021-11-17 MED ORDER — DULOXETINE HCL 30 MG PO CPEP: 30.0000 mg | ORAL_CAPSULE | Freq: Two times a day (BID) | ORAL | 0 refills | Status: DC

## 2021-11-17 MED ORDER — HYDROCODONE-ACETAMINOPHEN 7.5-325 MG PO TABS: 1.0000 | ORAL_TABLET | Freq: Three times a day (TID) | ORAL | 0 refills | Status: AC | PRN

## 2021-11-17 MED ORDER — AMITRIPTYLINE HCL 50 MG PO TABS: 50.0000 mg | ORAL_TABLET | Freq: Every day | ORAL | 0 refills | Status: DC

## 2021-11-17 NOTE — Progress Notes (Signed)
Established Patient Office Visit  Name: Holly French   MRN: [id number]    DOB: 1975-10-01   Date:11/17/2021  Today's Provider: Talitha Givens, MHS, PA-C Introduced myself to the patient as a PA-C and provided education on APPs in clinical practice.         Subjective  Chief Complaint  Chief Complaint  Patient presents with   Pain    Patient states she is in between switching pain management clinics and neither office will fill her medications until she establishes at her new office. Patient needs refills until she can get into the new office in about 3 weeks to a month.    Nicotine Dependence    Patient states she would like to discuss medication options to stop smoking     Nicotine Dependence Her urge triggers include company of smokers.   Pain management   States she has been seeing a pain management specialist for the past 11 years and he is switching clinics Has been told by new and old clinics that neither will fill her scripts and was told to come here  Explained that this clinic does not manage long-term pain medications and her pain management offices will have to coordinate to   Nicotine Dependence  Would like to discuss smoking cessation options She is currently still smoking  She is smoking anywhere from 6 cigarettes to half pack per day Discussed options for smoking cessation with Chantix and nicotine replacement options    Patient Active Problem List   Diagnosis Date Noted   Sinusitis 08/11/2021   Chronic sinusitis 07/17/2020   Anxiety 02/01/2019   Nicotine dependence, cigarettes, uncomplicated [id number]   Obesity 05/02/2015   Endometriosis 01/16/2015   Asthma 01/16/2015   Hypertension 01/16/2015   Allergic rhinitis 01/16/2015   Vitamin D deficiency 01/16/2015   Continuous opioid dependence ([place]) 10/22/2013   Lumbar post-laminectomy syndrome 12/12/2012    Past Surgical History:  Procedure Laterality Date   ABDOMINAL HYSTERECTOMY     EPF  and gastroc recession Right 01/14/2016   Per MD notes   laparoscopies     x3   NASAL SINUS SURGERY     x3   PLANTAR FASCIA RELEASE     SPINAL FUSION     lumbar    Family History  Problem Relation Age of Onset   Diabetes Mother    Hyperlipidemia Father    Allergies Daughter    COPD Maternal Grandmother    Lupus Maternal Grandmother    Cancer Maternal Grandfather        colon    Social History   Tobacco Use   Smoking status: Every Day    Packs/day: 0.50    Years: 18.00    Pack years: 9.00    Types: Cigarettes   Smokeless tobacco: Never  Substance Use Topics   Alcohol use: No    Alcohol/week: 0.0 standard drinks     Current Outpatient Medications:    albuterol (VENTOLIN HFA) 108 (90 Base) MCG/ACT inhaler, INHALE 2 PUFFS INTO THE LUNGS EVERY 6 HOURS AS NEEDED FOR WHEEZING OR SHORTNESS OF BREATH, Disp: 18 g, Rfl: 4   Eszopiclone 3 MG TABS, Take 3 mg by mouth at bedtime as needed., Disp: , Rfl:    fexofenadine (ALLEGRA ALLERGY) 180 MG tablet, Take 1 tablet (180 mg total) by mouth daily., Disp: 10 tablet, Rfl: 1   losartan (COZAAR) 50 MG tablet, Take 2 tablets (100 mg total) by mouth daily.,  Disp: 180 tablet, Rfl: 4   Melatonin 10 MG CAPS, Take by mouth at bedtime as needed. , Disp: , Rfl:    tiZANidine (ZANAFLEX) 4 MG capsule, Take 4 mg by mouth 3 (three) times daily., Disp: , Rfl:    triamcinolone ointment (KENALOG) 0.5 %, Apply 1 application topically 2 (two) times daily., Disp: 90 g, Rfl: 0   amitriptyline (ELAVIL) 50 MG tablet, Take 1 tablet (50 mg total) by mouth at bedtime., Disp: 30 tablet, Rfl: 0   Cholecalciferol (VITAMIN D3) 1.25 MG (50000 UT) CAPS, Take 1 capsule by mouth once a week. (Patient not taking: Reported on 11/17/2021), Disp: , Rfl:    DULoxetine (CYMBALTA) 30 MG capsule, Take 1 capsule (30 mg total) by mouth 2 (two) times daily., Disp: 60 capsule, Rfl: 0   HYDROcodone-acetaminophen (NORCO) 7.5-325 MG tablet, Take 1 tablet by mouth 3 (three) times daily as  needed for up to 5 days., Disp: 15 tablet, Rfl: 0  Current Facility-Administered Medications:    triamcinolone acetonide (KENALOG-40) injection 40 mg, 40 mg, Intramuscular, Once, Johnson, Megan P, DO  Allergies  Allergen Reactions   Covid-19 (Mrna) Vaccine Therapist, music) [Covid-19 (Mrna) Vaccine]    Neosporin [Neomycin-Bacitracin Zn-Polymyx] Dermatitis   Latex Rash    I personally reviewed active problem list, medication list, allergies with the patient/caregiver today.   Review of Systems  Constitutional:  Negative for chills, diaphoresis and fever.  Respiratory:  Negative for shortness of breath and wheezing.   Cardiovascular:  Negative for chest pain and palpitations.  Musculoskeletal:  Positive for back pain. Negative for falls and myalgias.  Neurological:  Negative for dizziness, tremors and weakness.     Objective  Vitals:   11/17/21 1457  BP: 111/72  Pulse: 97  Temp: 97.6 F (36.4 C)  SpO2: 99%  Weight: 226 lb 6.4 oz (102.7 kg)    Body mass index is 41.4 kg/m.  Physical Exam Vitals reviewed.  Constitutional:      Appearance: Normal appearance. She is obese.  HENT:     Head: Normocephalic and atraumatic.  Pulmonary:     Effort: Pulmonary effort is normal.  Neurological:     Mental Status: She is alert.  Psychiatric:        Attention and Perception: Attention normal.        Mood and Affect: Mood and affect normal.        Speech: Speech normal.        Behavior: Behavior normal. Behavior is cooperative.     No results found for this or any previous visit (from the past 2160 hour(s)).   PHQ2/9:    11/17/2021    3:03 PM 09/30/2021   10:36 AM 08/11/2021   11:22 AM 07/13/2021    1:16 PM 07/10/2021    4:02 PM  Depression screen PHQ 2/9  Decreased Interest 1 1 1 1 1   Down, Depressed, Hopeless 0 1 1 1  0  PHQ - 2 Score 1 2 2 2 1   Altered sleeping 1 1 1 2 2   Tired, decreased energy 1 2 1 2 2   Change in appetite 0 1 0 1 0  Feeling bad or failure about yourself   0 1 0 1 0  Trouble concentrating 0 0 1 0 1  Moving slowly or fidgety/restless 0 0 0 0 0  Suicidal thoughts 0 0 0 0 0  PHQ-9 Score 3 7 5 8 6   Difficult doing work/chores   Not difficult at all  Fall Risk:    11/17/2021    3:04 PM 09/30/2021   10:36 AM 08/11/2021   11:22 AM 07/13/2021    1:20 PM 07/13/2021    1:16 PM  Fall Risk   Falls in the past year? 0 0 0 0 0  Number falls in past yr: 0 0 0 0 0  Injury with Fall? 0 0 0 0 0  Risk for fall due to : No Fall Risks No Fall Risks No Fall Risks No Fall Risks No Fall Risks  Follow up Falls evaluation completed Falls evaluation completed Falls evaluation completed Falls evaluation completed Falls evaluation completed      Functional Status Survey:      Assessment & Plan  Problem List Items Addressed This Visit       Other   Anxiety - Primary    Chronic, historic condition She is taking Cymbalta for pain management which is also beneficial for anxiety Will refill for 30 days  Follow up with original prescriber for further refills        Relevant Medications   DULoxetine (CYMBALTA) 30 MG capsule   amitriptyline (ELAVIL) 50 MG tablet   Continuous opioid dependence ([place])    Chronic, historic condition She is usually managed by pain management but is in the middle of transition from one clinic to another  She states neither office will fill her longstanding scripts and she was told to reach out to PCP for assistance Will provide patch refills for 30 days of Amitriptyline and Cymbalta PDMP reviewed, no evidence of worrisome controlled substance use patterns at this time  Will provide 5 day supply of Hydrocodone-acetaminophen 7.5-325 mg to assist with transition Discussed that receiving this script may violate her pain contract and she should seek further assistance from her pain provider as this office does not typically manage chronic pain medications She voiced understanding and states the 5 day supply should allow her  time to sort out her scripts with her pain provider.        Relevant Medications   DULoxetine (CYMBALTA) 30 MG capsule   amitriptyline (ELAVIL) 50 MG tablet   HYDROcodone-acetaminophen (NORCO) 7.5-325 MG tablet   Lumbar post-laminectomy syndrome    Chronic, historic condition She is usually managed by pain management but is in the middle of transition from one clinic to another  She states neither office will fill her longstanding scripts and she was told to reach out to PCP for assistance Will provide patch refills for 30 days of Amitriptyline and Cymbalta PDMP reviewed, no evidence of worrisome controlled substance use patterns at this time  Will provide 5 day supply of Hydrocodone-acetaminophen 7.5-325 mg to assist with transition Discussed that receiving this script may violate her pain contract and she should seek further assistance from her pain provider as this office does not typically manage chronic pain medications She voiced understanding and states the 5 day supply should allow her time to sort out her scripts with her pain provider.         Relevant Medications   DULoxetine (CYMBALTA) 30 MG capsule   amitriptyline (ELAVIL) 50 MG tablet   HYDROcodone-acetaminophen (NORCO) 7.5-325 MG tablet     No follow-ups on file.   I, Zac Torti E Stephen Baruch, PA-C, have reviewed all documentation for this visit. The documentation on 11/17/21 for the exam, diagnosis, procedures, and orders are all accurate and complete.   Talitha Givens, MHS, PA-C [facility]

## 2021-11-17 NOTE — Assessment & Plan Note (Signed)
Chronic, historic condition She is usually managed by pain management but is in the middle of transition from one clinic to another  She states neither office will fill her longstanding scripts and she was told to reach out to PCP for assistance Will provide patch refills for 30 days of Amitriptyline and Cymbalta PDMP reviewed, no evidence of worrisome controlled substance use patterns at this time  Will provide 5 day supply of Hydrocodone-acetaminophen 7.5-325 mg to assist with transition Discussed that receiving this script may violate her pain contract and she should seek further assistance from her pain provider as this office does not typically manage chronic pain medications She voiced understanding and states the 5 day supply should allow her time to sort out her scripts with her pain provider.

## 2021-11-17 NOTE — Assessment & Plan Note (Signed)
Chronic, historic condition She is usually managed by pain management but is in the middle of transition from one clinic to another  She states neither office will fill her longstanding scripts and she was told to reach out to PCP for assistance Will provide patch refills for 30 days of Amitriptyline and Cymbalta PDMP reviewed, no evidence of worrisome controlled substance use patterns at this time  Will provide 5 day supply of Hydrocodone-acetaminophen 7.5-325 mg to assist with transition Discussed that receiving this script may violate her pain contract and she should seek further assistance from her pain provider as this office does not typically manage chronic pain medications She voiced understanding and states the 5 day supply should allow her time to sort out her scripts with her pain provider.   

## 2021-11-17 NOTE — Patient Instructions (Signed)
   Smoking Cessation For now I recommend trying to gradually reduce your cigarette use by at least half Save the cigarettes that are needed or part of your routine (the hardest to give up) and try to cut out the rest When you follow in July you can discuss proceeding with Chantix or nicotine replacement per your preference    Please reach out to your pain management provider for further refills of your pain medications

## 2021-11-17 NOTE — Assessment & Plan Note (Signed)
Chronic, historic condition She is taking Cymbalta for pain management which is also beneficial for anxiety Will refill for 30 days  Follow up with original prescriber for further refills

## 2021-11-27 ENCOUNTER — Encounter: Payer: Self-pay | Admitting: Family Medicine

## 2021-11-27 ENCOUNTER — Ambulatory Visit (INDEPENDENT_AMBULATORY_CARE_PROVIDER_SITE_OTHER): Payer: No Typology Code available for payment source | Admitting: Family Medicine

## 2021-11-27 VITALS — BP 129/85 | HR 113 | Temp 98.5°F | Wt 227.6 lb

## 2021-11-27 DIAGNOSIS — F1120 Opioid dependence, uncomplicated: Secondary | ICD-10-CM

## 2021-11-27 DIAGNOSIS — M961 Postlaminectomy syndrome, not elsewhere classified: Secondary | ICD-10-CM | POA: Diagnosis not present

## 2021-11-27 MED ORDER — HYDROCODONE-ACETAMINOPHEN 7.5-325 MG PO TABS: 1.0000 | ORAL_TABLET | Freq: Three times a day (TID) | ORAL | 0 refills | Status: DC

## 2021-11-27 NOTE — Assessment & Plan Note (Signed)
Last MRI done in 2015. Will obtain x-ray with goal of getting repeat MRI. Await results. Treat as needed.

## 2021-11-27 NOTE — Progress Notes (Signed)
BP 129/85   Pulse (!) 113   Temp 98.5 F (36.9 C) (Oral)   Wt 227 lb 9.6 oz (103.2 kg)   LMP 02/25/2009 (Approximate)   SpO2 96%   BMI 41.62 kg/m    Subjective:    Patient ID: Holly French, female    DOB: March 14, 1976, 46 y.o.   MRN: [id number]  HPI: Holly French is a 46 y.o. female  Chief Complaint  Patient presents with   Medication Problem    Pt states she is in between pain management providers, would like a referral to [facility]. The other provider is offering injections but she is not able to afford it the patient also states she is having increased lower back pain down the extremity.    CHRONIC PAIN  Present dose: 22.5 Morphine equivalents Pain control status: exacerbated Duration: chronic Location: low back into R leg Quality: throbbing and shooting, burning Current Pain Level: 8/10 Previous Pain Level: 5/10 Breakthrough pain: yes Benefit from narcotic medications: yes What Activities task can be accomplished with current medication? Interested in weaning off narcotics:no   Stool softners/OTC fiber: no  Previous pain specialty evaluation: yes Non-narcotic analgesic meds: yes Narcotic contract: not currently  Relevant past medical, surgical, family and social history reviewed and updated as indicated. Interim medical history since our last visit reviewed. Allergies and medications reviewed and updated.  Review of Systems  Constitutional: Negative.   Respiratory: Negative.    Cardiovascular: Negative.   Gastrointestinal: Negative.   Musculoskeletal:  Positive for back pain and myalgias. Negative for arthralgias, gait problem, joint swelling, neck pain and neck stiffness.  Skin: Negative.   Psychiatric/Behavioral: Negative.      Per HPI unless specifically indicated above     Objective:    BP 129/85   Pulse (!) 113   Temp 98.5 F (36.9 C) (Oral)   Wt 227 lb 9.6 oz (103.2 kg)   LMP 02/25/2009 (Approximate)    SpO2 96%   BMI 41.62 kg/m   Wt Readings from Last 3 Encounters:  11/27/21 227 lb 9.6 oz (103.2 kg)  11/17/21 226 lb 6.4 oz (102.7 kg)  10/06/21 222 lb 9.6 oz (101 kg)    Physical Exam Vitals and nursing note reviewed.  Constitutional:      General: She is not in acute distress.    Appearance: Normal appearance. She is obese. She is not ill-appearing, toxic-appearing or diaphoretic.  HENT:     Head: Normocephalic and atraumatic.     Right Ear: External ear normal.     Left Ear: External ear normal.     Nose: Nose normal.     Mouth/Throat:     Mouth: Mucous membranes are moist.     Pharynx: Oropharynx is clear.  Eyes:     General: No scleral icterus.       Right eye: No discharge.        Left eye: No discharge.     Extraocular Movements: Extraocular movements intact.     Conjunctiva/sclera: Conjunctivae normal.     Pupils: Pupils are equal, round, and reactive to light.  Cardiovascular:     Rate and Rhythm: Normal rate and regular rhythm.     Pulses: Normal pulses.     Heart sounds: Normal heart sounds. No murmur heard.    No friction rub. No gallop.  Pulmonary:     Effort: Pulmonary effort is normal. No respiratory distress.     Breath sounds: Normal breath  sounds. No stridor. No wheezing, rhonchi or rales.  Chest:     Chest wall: No tenderness.  Musculoskeletal:        General: Normal range of motion.     Cervical back: Normal range of motion and neck supple.  Skin:    General: Skin is warm and dry.     Capillary Refill: Capillary refill takes less than 2 seconds.     Coloration: Skin is not jaundiced or pale.     Findings: No bruising, erythema, lesion or rash.  Neurological:     General: No focal deficit present.     Mental Status: She is alert and oriented to person, place, and time. Mental status is at baseline.  Psychiatric:        Mood and Affect: Mood normal.        Behavior: Behavior normal.        Thought Content: Thought content normal.        Judgment:  Judgment normal.     Results for orders placed or performed in visit on 08/11/21  Novel Coronavirus, NAA ([facility])   Specimen: Nasopharyngeal(NP) swabs in vial transport medium  Result Value Ref Range   SARS-CoV-2, NAA Not Detected Not Detected  Rapid Strep screen([facility])   Specimen: Other   Other  Result Value Ref Range   Strep Gp A Ag, IA W/Reflex Negative Negative  Culture, Group A Strep   Other  Result Value Ref Range   Strep A Culture Negative   Veritor Flu A/B Waived  Result Value Ref Range   Influenza A Negative Negative   Influenza B Negative Negative      Assessment & Plan:   Problem List Items Addressed This Visit       Other   Continuous opioid dependence ([facility]) - Primary    Between pain clinics. New referral to pain management placed today. Discussed with PCP. She is OK with bridging her pain medicine. Refill for 1 month given today. Follow up 1 month.       Relevant Orders   Ambulatory referral to Pain Clinic   Lumbar post-laminectomy syndrome    Last MRI done in 2015. Will obtain x-ray with goal of getting repeat MRI. Await results. Treat as needed.       Relevant Orders   Ambulatory referral to Pain Clinic   DG Lumbar Spine Complete     Follow up plan: Return in about 4 weeks (around 12/25/2021) for before 7/16 so she doesn't run out of her medicine with PCP.

## 2021-11-27 NOTE — Assessment & Plan Note (Signed)
Between pain clinics. New referral to pain management placed today. Discussed with PCP. She is OK with bridging her pain medicine. Refill for 1 month given today. Follow up 1 month.

## 2021-12-02 ENCOUNTER — Telehealth: Payer: Self-pay

## 2021-12-02 NOTE — Telephone Encounter (Signed)
-----   Message from Pablo Ledger, [place] sent at 12/02/2021  1:19 PM EDT -----  ----- Message ----- From: Marjie Skiff, NP Sent: 12/02/2021  12:59 PM EDT To: [facility]  Please let patient know that we have a note from Dr. Welton Flakes who recent pain management referral was placed to.  He would like a new MRI of lumbar back which I will order, but I do recommend she obtain x-ray imaging Dr. Laural Benes ordered to, she has not obtained these yet.  We also need all recent pain management notes -- please see who she was seeing and see if we can obtain recent notes from them (may need her to sign release of records) then once notes obtained fax these to Dr. Welton Flakes at [phone].  He will not schedule her until her has all of this information sent to him.

## 2021-12-02 NOTE — Addendum Note (Signed)
Addended by: Aura Dials T on: 12/02/2021 01:00 PM   Modules accepted: Orders

## 2021-12-02 NOTE — Telephone Encounter (Signed)
Pt understood message listed below, plans to provide all necessary information by Friday.

## 2021-12-02 NOTE — Telephone Encounter (Signed)
Left message for patient to give our office a call to discuss recent recommendations per Jolene.   OK for [facility] to give note below if patient calls back.   "Please let patient know that we have a note from Dr. Welton Flakes who recent pain management referral was placed to.  He would like a new MRI of lumbar back which I will order, but I do recommend she obtain x-ray imaging Dr. Laural Benes ordered to, she has not obtained these yet.  We also need all recent pain management notes -- please see who she was seeing and see if we can obtain recent notes from them (may need her to sign release of records) then once notes obtained fax these to Dr. Welton Flakes at [phone].  He will not schedule her until her has all of this information sent to him."

## 2021-12-07 ENCOUNTER — Ambulatory Visit
Admission: RE | Admit: 2021-12-07 | Discharge: 2021-12-07 | Disposition: A | Discharge status: Home / Self Care | Payer: No Typology Code available for payment source | Source: Ambulatory Visit | Attending: Family Medicine | Admitting: Family Medicine

## 2021-12-07 ENCOUNTER — Ambulatory Visit
Admission: RE | Admit: 2021-12-07 | Discharge: 2021-12-07 | Disposition: A | Discharge status: Home / Self Care | Payer: No Typology Code available for payment source | Attending: Family Medicine | Admitting: Family Medicine

## 2021-12-07 DIAGNOSIS — M961 Postlaminectomy syndrome, not elsewhere classified: Secondary | ICD-10-CM

## 2021-12-14 ENCOUNTER — Ambulatory Visit
Admission: RE | Admit: 2021-12-14 | Discharge: 2021-12-14 | Disposition: A | Discharge status: Home / Self Care | Payer: No Typology Code available for payment source | Source: Ambulatory Visit | Attending: Nurse Practitioner | Admitting: Nurse Practitioner

## 2021-12-14 DIAGNOSIS — F1120 Opioid dependence, uncomplicated: Secondary | ICD-10-CM | POA: Diagnosis present

## 2021-12-14 DIAGNOSIS — M961 Postlaminectomy syndrome, not elsewhere classified: Secondary | ICD-10-CM | POA: Insufficient documentation

## 2021-12-16 NOTE — Progress Notes (Signed)
Contacted via MyChart -- Destiny can you fax this over to Dr. Milta Deiters office with pain management, he had wanted updated MRI.   Good morning Danahi, you imaging has returned.  Have you seen a neurosurgeon recently?  Let me know, you may benefit from seeing one if not.  Your lower back is showing a disc bulge to L4-5 and moderate arthritic changes.  I will have staff fax this to Dr. Welton Flakes with pain management and recommend you reach out to him to see if he can schedule you since he has your records and now new imaging.  Any questions? Keep being amazing!!  Thank you for allowing me to participate in your care.  I appreciate you. Kindest regards, Capone Schwinn

## 2021-12-17 ENCOUNTER — Other Ambulatory Visit: Payer: Self-pay | Admitting: Physician Assistant

## 2021-12-17 DIAGNOSIS — F419 Anxiety disorder, unspecified: Secondary | ICD-10-CM

## 2021-12-17 DIAGNOSIS — F1120 Opioid dependence, uncomplicated: Secondary | ICD-10-CM

## 2021-12-17 DIAGNOSIS — M961 Postlaminectomy syndrome, not elsewhere classified: Secondary | ICD-10-CM

## 2021-12-17 MED ORDER — AMITRIPTYLINE HCL 50 MG PO TABS: 50.0000 mg | ORAL_TABLET | Freq: Every day | ORAL | 0 refills | Status: DC

## 2021-12-17 MED ORDER — DULOXETINE HCL 30 MG PO CPEP: 30.0000 mg | ORAL_CAPSULE | Freq: Two times a day (BID) | ORAL | 0 refills | Status: DC

## 2021-12-20 NOTE — Patient Instructions (Signed)
Chronic Back Pain When back pain lasts longer than 3 months, it is called chronic back pain. Pain may get worse at certain times (flare-ups). There are things you can do at home to manage your pain. Follow these instructions at home: Pay attention to any changes in your symptoms. Take these actions to help with your pain: Managing pain and stiffness     If told, put ice on the painful area. Your doctor may tell you to use ice for 24-48 hours after the flare-up starts. To do this: Put ice in a plastic bag. Place a towel between your skin and the bag. Leave the ice on for 20 minutes, 2-3 times a day. If told, put heat on the painful area. Do this as often as told by your doctor. Use the heat source that your doctor recommends, such as a moist heat pack or a heating pad. Place a towel between your skin and the heat source. Leave the heat on for 20-30 minutes. Take off the heat if your skin turns bright red. This is especially important if you are unable to feel pain, heat, or cold. You may have a greater risk of getting burned. Soak in a warm bath. This can help relieve pain. Activity  Avoid bending and other activities that make pain worse. When standing: Keep your upper back and neck straight. Keep your shoulders pulled back. Avoid slouching. When sitting: Keep your back straight. Relax your shoulders. Do not round your shoulders or pull them backward. Do not sit or stand in one place for long periods of time. Take short rest breaks during the day. Lying down or standing is usually better than sitting. Resting can help relieve pain. When sitting or lying down for a long time, do some mild activity or stretching. This will help to prevent stiffness and pain. Get regular exercise. Ask your doctor what activities are safe for you. Do not lift anything that is heavier than 10 lb (4.5 kg) or the limit that you are told, until your doctor says that it is safe. To prevent injury when you lift  things: Bend your knees. Keep the weight close to your body. Avoid twisting. Sleep on a firm mattress. Try lying on your side with your knees slightly bent. If you lie on your back, put a pillow under your knees. Medicines Treatment may include medicines for pain and swelling taken by mouth or put on the skin, prescription pain medicine, or muscle relaxants. Take over-the-counter and prescription medicines only as told by your doctor. Ask your doctor if the medicine prescribed to you: Requires you to avoid driving or using machinery. Can cause trouble pooping (constipation). You may need to take these actions to prevent or treat trouble pooping: Drink enough fluid to keep your pee (urine) pale yellow. Take over-the-counter or prescription medicines. Eat foods that are high in fiber. These include beans, whole grains, and fresh fruits and vegetables. Limit foods that are high in fat and sugars. These include fried or sweet foods. General instructions Do not use any products that contain nicotine or tobacco, such as cigarettes, e-cigarettes, and chewing tobacco. If you need help quitting, ask your doctor. Keep all follow-up visits as told by your doctor. This is important. Contact a doctor if: Your pain does not get better with rest or medicine. Your pain gets worse, or you have new pain. You have a high fever. You lose weight very quickly. You have trouble doing your normal activities. Get help right away   if: One or both of your legs or feet feel weak. One or both of your legs or feet lose feeling (have numbness). You have trouble controlling when you poop (have a bowel movement) or pee (urinate). You have bad back pain and: You feel like you may vomit (nauseous), or you vomit. You have pain in your belly (abdomen). You have shortness of breath. You faint. Summary When back pain lasts longer than 3 months, it is called chronic back pain. Pain may get worse at certain times  (flare-ups). Use ice and heat as told by your doctor. Your doctor may tell you to use ice after flare-ups. This information is not intended to replace advice given to you by your health care provider. Make sure you discuss any questions you have with your health care provider. Document Revised: 07/11/2019 Document Reviewed: 07/11/2019 Elsevier Patient Education  2023 Elsevier Inc.  

## 2021-12-25 ENCOUNTER — Ambulatory Visit (INDEPENDENT_AMBULATORY_CARE_PROVIDER_SITE_OTHER): Payer: No Typology Code available for payment source | Admitting: Nurse Practitioner

## 2021-12-25 ENCOUNTER — Encounter: Payer: Self-pay | Admitting: Nurse Practitioner

## 2021-12-25 VITALS — BP 119/79 | HR 90 | Temp 98.1°F | Ht 62.01 in | Wt 226.0 lb

## 2021-12-25 DIAGNOSIS — I10 Essential (primary) hypertension: Secondary | ICD-10-CM

## 2021-12-25 DIAGNOSIS — M961 Postlaminectomy syndrome, not elsewhere classified: Secondary | ICD-10-CM

## 2021-12-25 DIAGNOSIS — F1120 Opioid dependence, uncomplicated: Secondary | ICD-10-CM | POA: Diagnosis not present

## 2021-12-25 DIAGNOSIS — Z6841 Body Mass Index (BMI) 40.0 and over, adult: Secondary | ICD-10-CM

## 2021-12-25 MED ORDER — HYDROCODONE-ACETAMINOPHEN 7.5-325 MG PO TABS: 1.0000 | ORAL_TABLET | Freq: Three times a day (TID) | ORAL | 0 refills | Status: DC

## 2021-12-25 NOTE — Assessment & Plan Note (Signed)
Chronic, stable with BP at goal today.  Recommend she monitor BP at least a few mornings a week at home and document.  DASH diet at home.  Continue current medication regimen and adjust as needed.  LABS: up to date.

## 2021-12-25 NOTE — Assessment & Plan Note (Signed)
Refer to chronic opioid plan of care. 

## 2021-12-25 NOTE — Assessment & Plan Note (Signed)
BMI 41.32.  Recommended eating smaller high protein, low fat meals more frequently and exercising 30 mins a day 5 times a week with a goal of 10-15lb weight loss in the next 3 months. Patient voiced their understanding and motivation to adhere to these recommendations.

## 2021-12-25 NOTE — Progress Notes (Signed)
BP 119/79   Pulse 90   Temp 98.1 F (36.7 C) (Oral)   Ht 5' 2.01" (1.575 m)   Wt 226 lb (102.5 kg)   LMP 02/25/2009 (Approximate)   SpO2 97%   BMI 41.32 kg/m    Subjective:    Patient ID: Holly French, female    DOB: 1975-10-22, 46 y.o.   MRN: [id number]  HPI: Holly French is a 46 y.o. female  Chief Complaint  Patient presents with   Back Pain    Patient says her back within the past couple of weeks as started to go numb across the lower back. Patient says she is waiting to hear back from the pain management doctor to get an appointment.    CHRONIC PAIN  Currently between pain clinics -- her pain management provider went to part-time only [facility]) and was doing injections only, so they advised her to find a pain management provider.  Referral was sent to Dr. Welton Flakes -- she took records to him and MRI was done per request.  History of lumbar fusion L5-S1 -- 2010.    She had new symptom start 2 1/2 weeks ago, like someone put something numbing in lower back and top of tailbone (pins and needles).  Pain is at baseline 5-6/10.  She is seeing [facility] on the 24th.   Present dose:  22.5 Morphine equivalents Pain control status: stable Duration: chronic Location: lower back and down right leg Quality: dull, aching, and throbbing Current Pain Level: 6/10 Previous Pain Level: 10/10 Breakthrough pain: no Benefit from narcotic medications: yes What Activities task can be accomplished with current medication? Can do all her ADLs and work full-time Interested in Social research officer, government off narcotics:no   Stool softners/OTC fiber: no  Previous pain specialty evaluation: yes Non-narcotic analgesic meds: yes Narcotic contract:  waiting on pain management    HYPERTENSION Continues on Losartan. Hypertension status: stable  Satisfied with current treatment? yes Duration of hypertension: chronic BP monitoring frequency:  not checking BP range:  BP medication side effects:   no Medication compliance Aspirin: no Recurrent headaches: no Visual changes: no Palpitations: no Dyspnea: no Chest pain: no Lower extremity edema: no Dizzy/lightheaded: no   Relevant past medical, surgical, family and social history reviewed and updated as indicated. Interim medical history since our last visit reviewed. Allergies and medications reviewed and updated.  Review of Systems  Constitutional:  Negative for activity change, appetite change, diaphoresis, fatigue and fever.  Respiratory:  Negative for cough, chest tightness and shortness of breath.   Cardiovascular:  Negative for chest pain, palpitations and leg swelling.  Gastrointestinal: Negative.   Neurological: Negative.   Psychiatric/Behavioral: Negative.      Per HPI unless specifically indicated above     Objective:    BP 119/79   Pulse 90   Temp 98.1 F (36.7 C) (Oral)   Ht 5' 2.01" (1.575 m)   Wt 226 lb (102.5 kg)   LMP 02/25/2009 (Approximate)   SpO2 97%   BMI 41.32 kg/m   Wt Readings from Last 3 Encounters:  12/25/21 226 lb (102.5 kg)  11/27/21 227 lb 9.6 oz (103.2 kg)  11/17/21 226 lb 6.4 oz (102.7 kg)    Physical Exam Vitals and nursing note reviewed.  Constitutional:      General: She is awake. She is not in acute distress.    Appearance: She is well-developed and well-groomed. She is obese. She is not ill-appearing or toxic-appearing.  HENT:  Head: Normocephalic.     Right Ear: Hearing normal.     Left Ear: Hearing normal.  Eyes:     General: Lids are normal.        Right eye: No discharge.        Left eye: No discharge.     Conjunctiva/sclera: Conjunctivae normal.     Pupils: Pupils are equal, round, and reactive to light.  Neck:     Thyroid: No thyromegaly.     Vascular: No carotid bruit.  Cardiovascular:     Rate and Rhythm: Normal rate and regular rhythm.     Heart sounds: Normal heart sounds. No murmur heard.    No gallop.  Pulmonary:     Effort: Pulmonary effort is  normal. No accessory muscle usage or respiratory distress.     Breath sounds: Normal breath sounds.  Abdominal:     General: Bowel sounds are normal.     Palpations: Abdomen is soft. There is no hepatomegaly or splenomegaly.  Musculoskeletal:     Cervical back: Normal range of motion and neck supple.     Right lower leg: No edema.     Left lower leg: No edema.  Skin:    General: Skin is warm and dry.  Neurological:     Mental Status: She is alert and oriented to person, place, and time.  Psychiatric:        Attention and Perception: Attention normal.        Mood and Affect: Mood normal.        Speech: Speech normal.        Behavior: Behavior normal. Behavior is cooperative.        Thought Content: Thought content normal.     Results for orders placed or performed in visit on 08/11/21  Novel Coronavirus, NAA ([facility])   Specimen: Nasopharyngeal(NP) swabs in vial transport medium  Result Value Ref Range   SARS-CoV-2, NAA Not Detected Not Detected  Rapid Strep screen([facility])   Specimen: Other   Other  Result Value Ref Range   Strep Gp A Ag, IA W/Reflex Negative Negative  Culture, Group A Strep   Other  Result Value Ref Range   Strep A Culture Negative   Veritor Flu A/B Waived  Result Value Ref Range   Influenza A Negative Negative   Influenza B Negative Negative      Assessment & Plan:   Problem List Items Addressed This Visit       Cardiovascular and Mediastinum   Hypertension    Chronic, stable with BP at goal today.  Recommend she monitor BP at least a few mornings a week at home and document.  DASH diet at home.  Continue current medication regimen and adjust as needed.  LABS: up to date.         Other   Continuous opioid dependence (HCC) - Primary    Chronic.  She is between pain clinics, a new referral was placed last visit but she has not heard from them (we repeated MRI and she took all records to them) -- had referral coordinator check and this  is still in review.  May need to send this elsewhere.  At this time will continue to bridge he pain medication -- one month refill today and return in one month until we can get into pain clinic.      Lumbar post-laminectomy syndrome    Refer to chronic opioid plan of care.      Obesity  BMI 41.32.  Recommended eating smaller high protein, low fat meals more frequently and exercising 30 mins a day 5 times a week with a goal of 10-15lb weight loss in the next 3 months. Patient voiced their understanding and motivation to adhere to these recommendations.         Follow up plan: Return in about 4 weeks (around 01/22/2022) for CHRONIC PAIN.

## 2021-12-25 NOTE — Assessment & Plan Note (Signed)
Chronic.  She is between pain clinics, a new referral was placed last visit but she has not heard from them (we repeated MRI and she took all records to them) -- had referral coordinator check and this is still in review.  May need to send this elsewhere.  At this time will continue to bridge he pain medication -- one month refill today and return in one month until we can get into pain clinic.

## 2022-01-02 IMAGING — DX DG CHEST 2V
2 series · 2 of 2 positions shown · non-contrast
Comparison: 06/19/2012

CLINICAL DATA: Cough

EXAM:
CHEST - 2 VIEW

[chest pa]
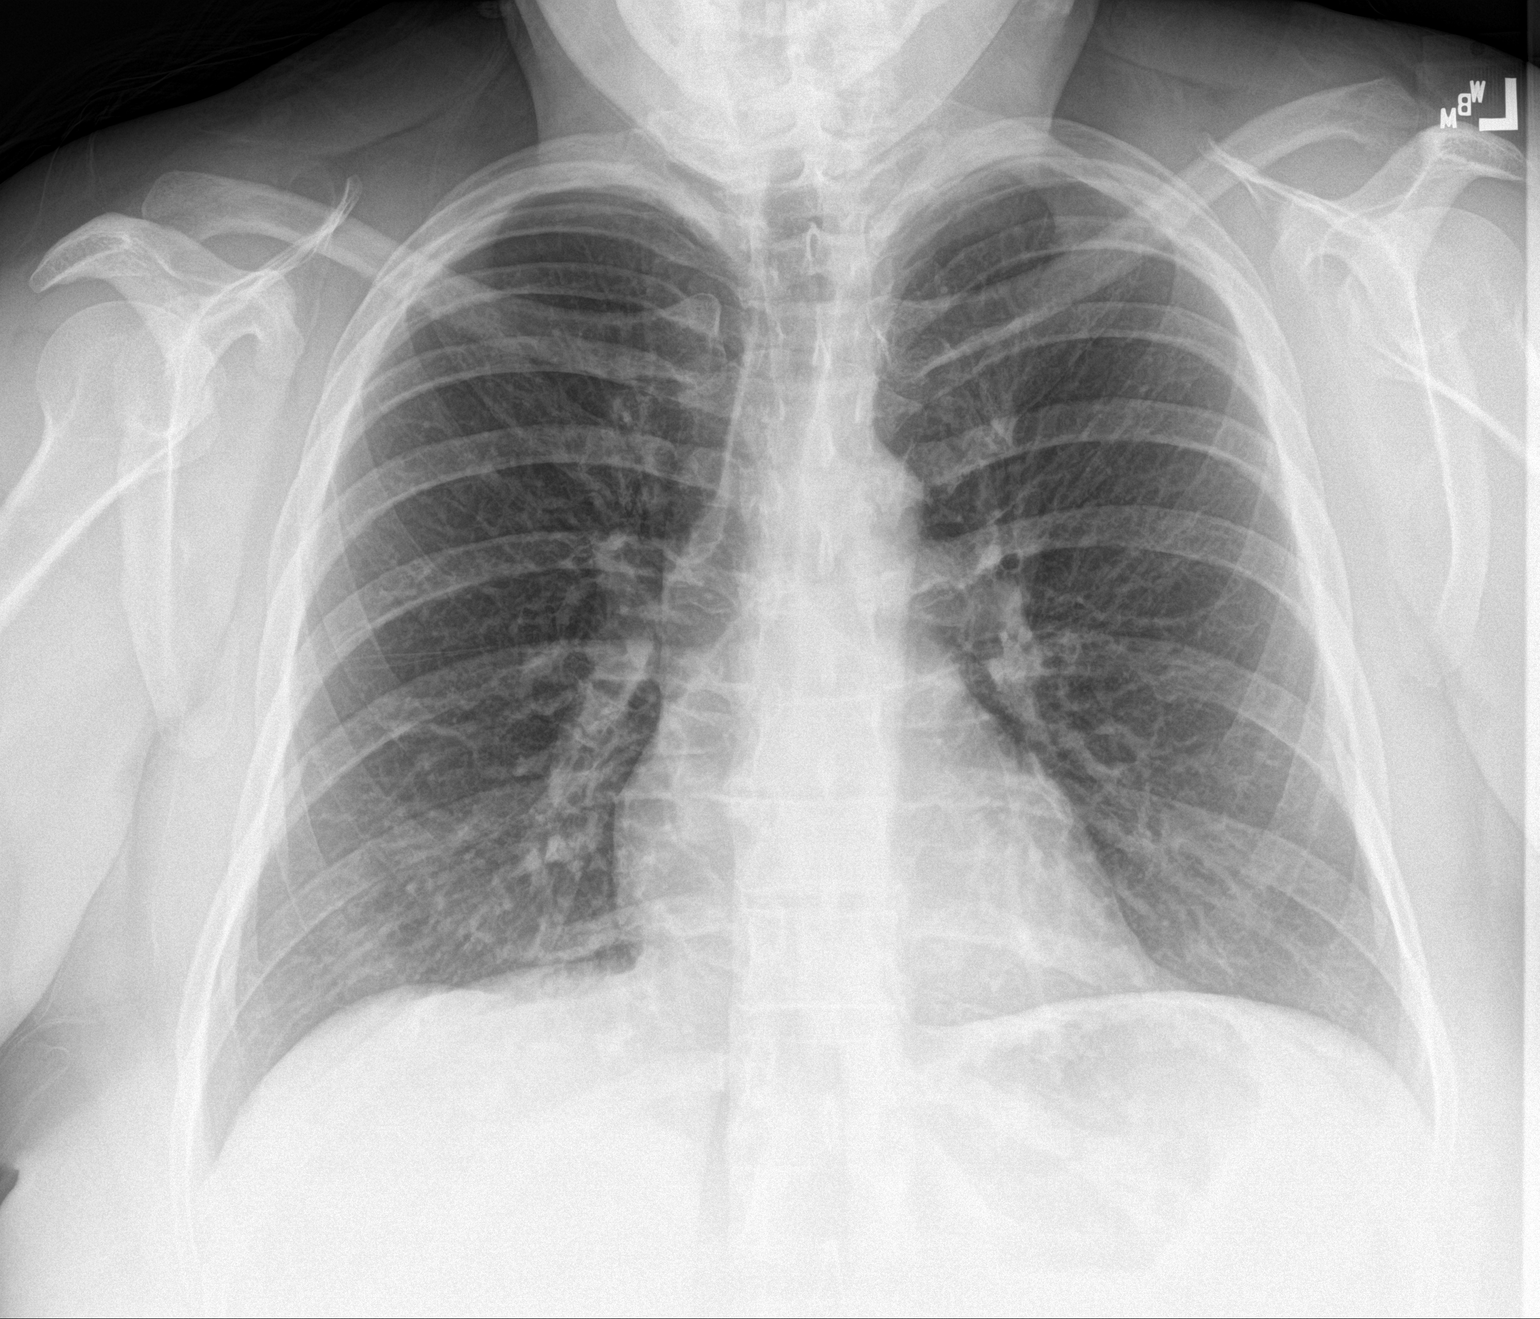

[chest lat]
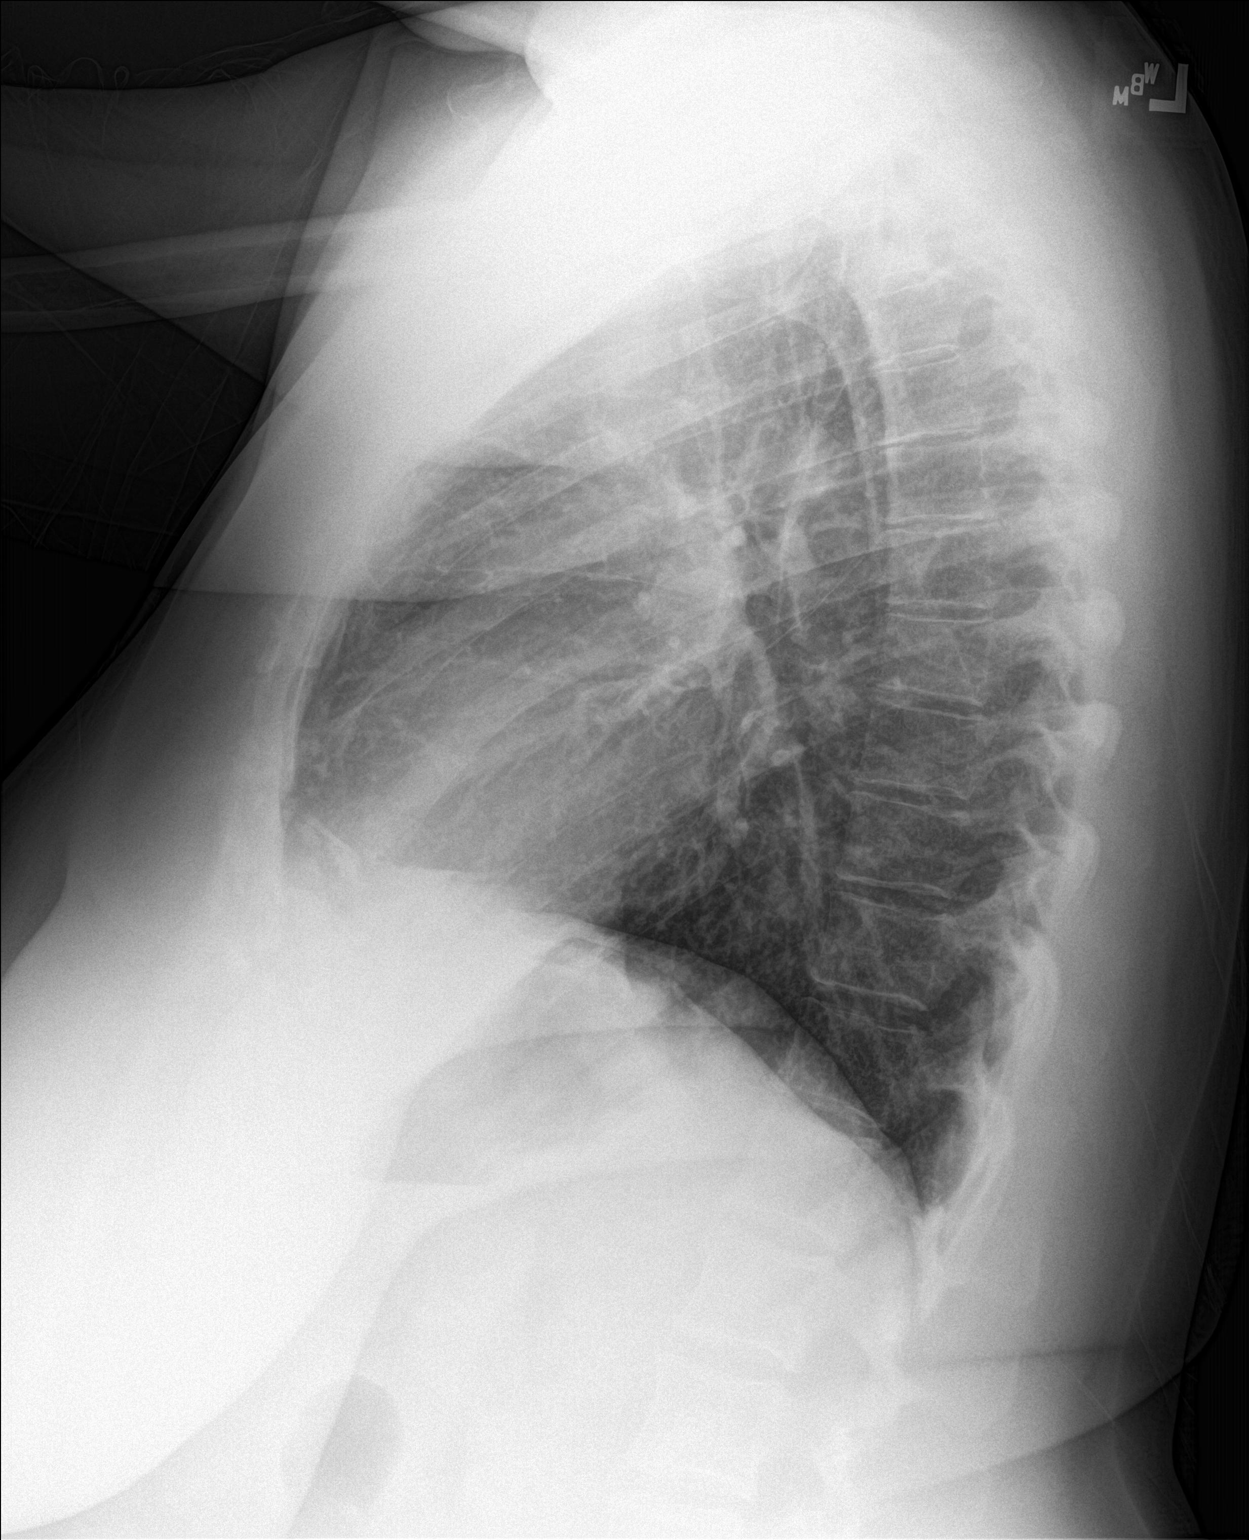

[2 of 2 positions shown; findings below may reference images not displayed]

FINDINGS: The heart size and mediastinal contours are within normal limits.
Both lungs are clear. The visualized skeletal structures are
unremarkable.
IMPRESSION: No active cardiopulmonary disease.

## 2022-01-11 ENCOUNTER — Ambulatory Visit: Payer: No Typology Code available for payment source | Admitting: Nurse Practitioner

## 2022-01-13 ENCOUNTER — Other Ambulatory Visit: Payer: Self-pay | Admitting: Nurse Practitioner

## 2022-01-13 DIAGNOSIS — F419 Anxiety disorder, unspecified: Secondary | ICD-10-CM

## 2022-01-13 DIAGNOSIS — F1120 Opioid dependence, uncomplicated: Secondary | ICD-10-CM

## 2022-01-13 DIAGNOSIS — M961 Postlaminectomy syndrome, not elsewhere classified: Secondary | ICD-10-CM

## 2022-01-14 NOTE — Telephone Encounter (Signed)
Requested Prescriptions  Pending Prescriptions Disp Refills  . amitriptyline (ELAVIL) 50 MG tablet [Pharmacy Med Name: AMITRIPTYLINE HCL 50 MG TAB] 90 tablet 0    Sig: TAKE 1 TABLET BY MOUTH AT BEDTIME     Psychiatry:  Antidepressants - Heterocyclics (TCAs) Passed - 01/13/2022  6:22 AM      Passed - Valid encounter within last 6 months    Recent Outpatient Visits          2 weeks ago Continuous opioid dependence ([place])   [place], [place] T, NP   1 month ago Continuous opioid dependence ([place])   [place], [place], DO   1 month ago [place], PA-C   3 months ago Nasal congestion   [place], [place], DO   3 months ago Acute non-recurrent maxillary sinusitis   [place], DO      Future Appointments            In 1 week Cannady, Barbaraann Faster, NP [facility], [facility]           . DULoxetine (CYMBALTA) 30 MG capsule [Pharmacy Med Name: DULoxetine HCL DR 30 MG CAPSULE] 180 capsule 0    Sig: TAKE 1 CAPSULE BY MOUTH TWICE A DAY     Psychiatry: Antidepressants - SNRI - duloxetine Passed - 01/13/2022  6:22 AM      Passed - Cr in normal range and within 360 days    Creatinine, Ser  Date Value Ref Range Status  07/13/2021 0.76 0.57 - 1.00 mg/dL Final         Passed - eGFR is 30 or above and within 360 days    GFR calc Af Amer  Date Value Ref Range Status  04/09/2020 104 >59 mL/min/1.73 Final    Comment:    **In accordance with recommendations from the NKF-ASN Task force,**   [facility] is in the process of updating its eGFR calculation to the   2021 CKD-EPI creatinine equation that estimates kidney function   without a race variable.    GFR calc non Af Amer  Date Value Ref Range Status  04/09/2020 90 >59 mL/min/1.73 Final   eGFR  Date Value Ref Range Status  07/13/2021 100 >59 mL/min/1.73 Final         Passed - Completed PHQ-2 or  PHQ-9 in the last 360 days      Passed - Last BP in normal range    BP Readings from Last 1 Encounters:  12/25/21 119/79         Passed - Valid encounter within last 6 months    Recent Outpatient Visits          2 weeks ago Continuous opioid dependence ([place])   [place], [place] T, NP   1 month ago Continuous opioid dependence ([place])   [place], [place], DO   1 month ago [place], PA-C   3 months ago Nasal congestion   [place], [place], DO   3 months ago Acute non-recurrent maxillary sinusitis   [place], Megan P, DO      Future Appointments            In 1 week Cannady, Barbaraann Faster, NP [facility], [facility]

## 2022-01-23 NOTE — Patient Instructions (Incomplete)
Dolor de espalda crnico Chronic Back Pain Cuando el dolor en la espalda dura ms de 3 meses, se denomina dolor de espalda crnico. Es posible que se desconozca la causa de esta afeccin. Algunas causas frecuentes son las siguientes: Desgaste (enfermedad degenerativa) de los huesos, los ligamentos o los discos de la espalda. Inflamacin y rigidez en la espalda (artritis). Las personas que sufren dolor de espalda crnico generalmente atraviesan determinados perodos en los que este es ms intenso (episodios de exacerbacin del dolor). Muchas personas pueden aprender a controlar el dolor de espalda con el cuidado en el hogar. Siga estas instrucciones en su casa: Est atento a cualquier cambio en los sntomas. Tome estas medidas para aliviar el dolor: Control del dolor y de la rigidez     Si se lo indican, aplique hielo sobre la zona dolorida. El mdico puede recomendarle que se aplique hielo durante las primeras 24 a 48 horas despus del comienzo de un episodio de exacerbacin del dolor. Para hacer esto: Ponga el hielo en una bolsa plstica. Coloque una toalla entre la piel y la bolsa. Coloque el hielo durante 20 minutos, 2 a 3 veces al da. Si se lo indican, aplique calor en la zona afectada con la frecuencia que le haya indicado el mdico. Use la fuente de calor que el mdico le recomiende, como una compresa de calor hmedo o una almohadilla trmica. Coloque una toalla entre la piel y la fuente de calor. Aplique calor durante 20 a 30 minutos. Retire la fuente de calor si la piel se pone de color rojo brillante. Esto es especialmente importante si no puede sentir dolor, calor o fro. Puede correr un riesgo mayor de sufrir quemaduras. Intente tomar un bao de inmersin con agua caliente. Actividad  Evite agacharse y realizar otras actividades que agraven el problema. Mantenga una postura correcta mientras est de pie o sentado: Cuando est de pie, mantenga la parte alta de la espalda y el  cuello rectos, con los hombros hacia atrs. Evite encorvarse. Cuando est sentado, mantenga la espalda recta y relaje los hombros. No curve los hombros ni los eche hacia atrs. No permanezca sentado o de pie en el mismo lugar durante mucho tiempo. Durante el da, descanse durante lapsos breves. Esto le aliviar el dolor. Descansar recostado o de pie suele ser mejor que hacerlo sentado. Cuando descanse durante perodos ms largos, incorpore alguna actividad suave o ejercicios de elongacin entre uno y otro. Esto ayudar a evitar la rigidez y el dolor. Haga ejercicio con regularidad. Pregntele al mdico qu actividades son seguras para usted. No levante ningn objeto que pese ms de 10 libras (4.5 kg) o que supere el lmite de peso que le hayan indicado, hasta que el mdico le diga que puede hacerlo. Siempre use las tcnicas correctas para levantar objetos, entre ellas: Flexionar las rodillas. Mantener la carga cerca del cuerpo. No torcerse. Duerma sobre un colchn firme en una posicin cmoda. Intente acostarse de costado, con las rodillas ligeramente flexionadas. Si se recuesta sobre la espalda, coloque una almohada debajo de las rodillas. Medicamentos El tratamiento puede incluir medicamentos para el dolor y la inflamacin que se toman por boca o que se aplican sobre la piel, analgsicos recetados o relajantes musculares. Use los medicamentos de venta libre y los recetados solamente como se lo haya indicado el mdico. Pregntele al mdico si el medicamento recetado: Hace necesario que evite conducir o usar maquinaria. Puede causarle estreimiento. Es posible que tenga que tomar estas medidas para prevenir o tratar   el estreimiento: Beber suficiente lquido como para mantener la orina de color amarillo plido. Usar medicamentos recetados o de venta libre. Consumir alimentos ricos en fibra, como frijoles, cereales integrales, y frutas y verduras frescas. Limitar el consumo de alimentos ricos en  grasa y azcares procesados, como los alimentos fritos o dulces. Instrucciones generales No consuma ningn producto que contenga nicotina o tabaco, como cigarrillos, cigarrillos electrnicos y tabaco de mascar. Si necesita ayuda para dejar de consumir estos productos, consulte al mdico. Concurra a todas las visitas de seguimiento como se lo haya indicado el mdico. Esto es importante. Comunquese con un mdico si: Siente un dolor que no se alivia con reposo o medicamentos. Su dolor empeora o tiene un dolor nuevo. Tiene fiebre alta. Pierde de peso con rapidez. Tiene dificultad para realizar las actividades cotidianas. Solicite ayuda de inmediato si: Siente debilidad o adormecimiento en una o ambas piernas, o en uno o ambos pies. Tiene dificultad para controlar la miccin o la defecacin. Siente un dolor intenso en la espalda y tiene alguno de los siguientes sntomas: Nuseas o vmitos. Dolor en el abdomen. Le falta el aire o se desmaya. Resumen El dolor de espalda crnico es aquel que dura ms de 3 meses. Cuando comienza un episodio de exacerbacin del dolor, aplique hielo en la zona dolorida durante las primeras 24 a 48 horas. Aplquese una compresa de calor hmedo o una almohadilla trmica como se lo haya indicado el mdico. Cuando descanse durante perodos ms largos, incorpore alguna actividad suave o ejercicios de elongacin entre uno y otro. Esto ayudar a evitar la rigidez y el dolor. Esta informacin no tiene como fin reemplazar el consejo del mdico. Asegrese de hacerle al mdico cualquier pregunta que tenga. Document Revised: 09/25/2019 Document Reviewed: 09/25/2019 Elsevier Patient Education  2023 Elsevier Inc.  

## 2022-01-25 ENCOUNTER — Ambulatory Visit (INDEPENDENT_AMBULATORY_CARE_PROVIDER_SITE_OTHER): Payer: No Typology Code available for payment source | Admitting: Nurse Practitioner

## 2022-01-25 ENCOUNTER — Encounter: Payer: Self-pay | Admitting: Nurse Practitioner

## 2022-01-25 VITALS — BP 121/82 | HR 88 | Temp 97.7°F | Wt 225.8 lb

## 2022-01-25 DIAGNOSIS — M961 Postlaminectomy syndrome, not elsewhere classified: Secondary | ICD-10-CM | POA: Diagnosis not present

## 2022-01-25 DIAGNOSIS — F1120 Opioid dependence, uncomplicated: Secondary | ICD-10-CM

## 2022-01-25 MED ORDER — TIZANIDINE HCL 4 MG PO CAPS: 4.0000 mg | ORAL_CAPSULE | Freq: Three times a day (TID) | ORAL | 1 refills | Status: DC

## 2022-01-25 MED ORDER — HYDROCODONE-ACETAMINOPHEN 7.5-325 MG PO TABS: 1.0000 | ORAL_TABLET | Freq: Three times a day (TID) | ORAL | 0 refills | Status: DC

## 2022-01-25 NOTE — Assessment & Plan Note (Signed)
Chronic.  She is between pain clinics, has visit with [facility] in September (we repeated MRI and she has all records).  At this time will continue to bridge he pain medication -- one month refill today and return in one month until we can get into pain clinic.  CT scan lumbar spine ordered per neurosurgery request.

## 2022-01-25 NOTE — Assessment & Plan Note (Signed)
Refer to chronic opioid plan of care. 

## 2022-01-25 NOTE — Progress Notes (Signed)
BP 121/82   Pulse 88   Temp 97.7 F (36.5 C) (Oral)   Wt 225 lb 12.8 oz (102.4 kg)   LMP 02/25/2009 (Approximate)   SpO2 99%   BMI 41.29 kg/m    Subjective:    Patient ID: Holly French, female    DOB: 01/21/1976, 46 y.o.   MRN: [id number]  HPI: Holly French is a 46 y.o. female  Chief Complaint  Patient presents with   Pain    Follow up on chronic pain    CHRONIC PAIN  Currently between pain clinics -- her pain management provider at [facility] went to part-time only and was injections only, so they advised her to find a pain management provider to takeover oral medications.  Referral was sent to Dr. Welton Flakes who she sent all records to and did not hear from -- is scheduled with [facility] at this time.  History of lumbar fusion L5-S1 -- 2010, [facility] feels there is possibility this did not fully fuse and they are requesting PCP to order CT Lumbar spine -- had recent MRI, but they want CT.  Last Noro refill was 12/26/21.  Pain is at baseline 5-6/10.   Present dose:  22.5 Morphine equivalents Pain control status: stable Duration: chronic Location: lower back and down right leg Quality: dull, aching, and throbbing Current Pain Level: 6/10 Previous Pain Level: 10/10 Breakthrough pain: no Benefit from narcotic medications: yes What Activities task can be accomplished with current medication? Can do all her ADLs and work full-time Interested in Social research officer, government off narcotics:no   Stool softners/OTC fiber: no  Previous pain specialty evaluation: yes Non-narcotic analgesic meds: yes Narcotic contract: awaiting pain management visit  Relevant past medical, surgical, family and social history reviewed and updated as indicated. Interim medical history since our last visit reviewed. Allergies and medications reviewed and updated.  Review of Systems  Constitutional:  Negative for activity change, appetite change, diaphoresis, fatigue and fever.  Respiratory:   Negative for cough, chest tightness and shortness of breath.   Cardiovascular:  Negative for chest pain, palpitations and leg swelling.  Gastrointestinal: Negative.   Neurological: Negative.   Psychiatric/Behavioral: Negative.     Per HPI unless specifically indicated above     Objective:    BP 121/82   Pulse 88   Temp 97.7 F (36.5 C) (Oral)   Wt 225 lb 12.8 oz (102.4 kg)   LMP 02/25/2009 (Approximate)   SpO2 99%   BMI 41.29 kg/m   Wt Readings from Last 3 Encounters:  01/25/22 225 lb 12.8 oz (102.4 kg)  12/25/21 226 lb (102.5 kg)  11/27/21 227 lb 9.6 oz (103.2 kg)    Physical Exam Vitals and nursing note reviewed.  Constitutional:      General: She is awake. She is not in acute distress.    Appearance: She is well-developed and well-groomed. She is obese. She is not ill-appearing or toxic-appearing.  HENT:     Head: Normocephalic.     Right Ear: Hearing normal.     Left Ear: Hearing normal.  Eyes:     General: Lids are normal.        Right eye: No discharge.        Left eye: No discharge.     Conjunctiva/sclera: Conjunctivae normal.     Pupils: Pupils are equal, round, and reactive to light.  Neck:     Thyroid: No thyromegaly.     Vascular: No carotid bruit.  Cardiovascular:     Rate and Rhythm: Normal rate and regular rhythm.     Heart sounds: Normal heart sounds. No murmur heard.    No gallop.  Pulmonary:     Effort: Pulmonary effort is normal. No accessory muscle usage or respiratory distress.     Breath sounds: Normal breath sounds.  Abdominal:     General: Bowel sounds are normal.     Palpations: Abdomen is soft.  Musculoskeletal:     Cervical back: Normal range of motion and neck supple.     Right lower leg: No edema.     Left lower leg: No edema.  Skin:    General: Skin is warm and dry.  Neurological:     Mental Status: She is alert and oriented to person, place, and time.  Psychiatric:        Attention and Perception: Attention normal.         Mood and Affect: Mood normal.        Speech: Speech normal.        Behavior: Behavior normal. Behavior is cooperative.        Thought Content: Thought content normal.    Results for orders placed or performed in visit on 08/11/21  Novel Coronavirus, NAA ([facility])   Specimen: Nasopharyngeal(NP) swabs in vial transport medium  Result Value Ref Range   SARS-CoV-2, NAA Not Detected Not Detected  Rapid Strep screen([facility])   Specimen: Other   Other  Result Value Ref Range   Strep Gp A Ag, IA W/Reflex Negative Negative  Culture, Group A Strep   Other  Result Value Ref Range   Strep A Culture Negative   Veritor Flu A/B Waived  Result Value Ref Range   Influenza A Negative Negative   Influenza B Negative Negative      Assessment & Plan:   Problem List Items Addressed This Visit       Other   Continuous opioid dependence (HCC) - Primary    Chronic.  She is between pain clinics, has visit with [facility] in September (we repeated MRI and she has all records).  At this time will continue to bridge he pain medication -- one month refill today and return in one month until we can get into pain clinic.  CT scan lumbar spine ordered per neurosurgery request.      Postlaminectomy syndrome, lumbar region    Refer to chronic opioid plan of care.      Relevant Orders   CT Lumbar Spine W Contrast     Follow up plan: Return in about 4 weeks (around 02/22/2022) for CHRONIC PAIN .

## 2022-02-18 ENCOUNTER — Ambulatory Visit
Admission: RE | Admit: 2022-02-18 | Discharge: 2022-02-18 | Disposition: A | Discharge status: Home / Self Care | Payer: No Typology Code available for payment source | Source: Ambulatory Visit | Attending: Nurse Practitioner | Admitting: Nurse Practitioner

## 2022-02-18 DIAGNOSIS — M961 Postlaminectomy syndrome, not elsewhere classified: Secondary | ICD-10-CM | POA: Insufficient documentation

## 2022-02-20 NOTE — Progress Notes (Signed)
Contacted via MyChart   Good evening Holly French, your imaging has returned.  There are arthritic changes noted, but it appears past surgical fusion is intact.  Please let neurosurgery see these, as I know it was requested by them.  Thank you:) Keep being amazing!!  Thank you for allowing me to participate in your care.  I appreciate you. Kindest regards, Melani Brisbane

## 2022-02-21 NOTE — Patient Instructions (Signed)
Managing Chronic Back Pain Chronic back pain is back pain that lasts for 12 weeks or longer. It often affects the lower back. Back pain may feel like a muscle ache or a sharp, stabbing pain. It can be mild, moderate, or severe. If you have been diagnosed with chronic back pain, there are things you can do to manage your symptoms. You may have to try different things to see what works best for you. Your health care provider may also give you specific instructions. How to manage lifestyle changes Treating chronic back pain often starts with rest and pain relief, followed by exercises to restore movement and strength to your back (physical therapy). You may need surgery if other treatments do not help, or if your pain is caused by a condition or an injury. Follow your treatment plan as told by your health care provider. This may include: Relaxation techniques. Talk therapy or counseling with a mental health specialist. A form of talk therapy called cognitive behavioral therapy (CBT) can be especially helpful. This therapy helps you set goals and follow up on the changes that you make. Acupuncture or massage therapy. Local electrical stimulation. Injections. These deliver numbing or pain-relieving medicines into your spine or the area of pain. How to recognize changes in your chronic back pain Your condition may improve with treatment. However, back pain may not go away or may get worse over time. Watch your symptoms carefully and let your health care provider know if your symptoms get worse or do not improve. Your back pain may be getting worse if you have: Pain that begins to cause problems with posture. Pain that gets worse when you are sitting, standing, walking, bending, or lifting. Pain that affects you while you are active, or at rest, or both. Pain that eventually makes it hard to move around (limits mobility). Pain that occurs with fever, weight loss, or difficulty urinating. Pain that causes  numbness and tingling. How to use body mechanics and posture to help with pain Healthy body mechanics and good posture can help to relieve stress on your back. Body mechanics refers to the movements and positions of your body during your daily activities. Posture is part of body mechanics. Good posture means: Your spine is in its natural S-curve, or neutral, position. Your shoulders are pulled back slightly. Your head is not tipped forward. Follow these guidelines to improve your posture and body mechanics in your everyday activities. Standing  When standing, keep your spine neutral and your feet about hip-width apart. Keep your knees slightly bent. Your ears, shoulders, and hips should line up. When you do a task in which you stand in one place for a long time, place one foot on a stable object that is 2-4 inches (5-10 cm) high, such as a footstool. This helps keep your spine neutral. Sitting  When sitting, keep your spine neutral and your feet flat on the floor. Use a footrest, if necessary, and keep your thighs parallel to the floor. Avoid rounding your shoulders, and avoid tilting your head forward. When working at a desk or a computer, keep your desk at a height where your hands are slightly lower than your elbows. Slide your chair under your desk so you are close enough to maintain good posture. When working at a computer, place your monitor at a height where you are looking straight ahead and you do not have to tilt your head forward or downward to view the screen. Lifting  Keep your feet at   least shoulder-width apart and tighten the muscles of your abdomen. Bend your knees and hips and keep your spine neutral. Be sure to lift using the strength of your legs, not your back. Do not lock your knees straight out. Always ask for help to lift heavy or awkward objects. Resting  When lying down and resting, avoid positions that are most painful. If you have pain with activities such as  sitting, bending, stooping, or squatting, lie in a position in which your body does not bend very much. For example, avoid curling up on your side with your arms and knees near your chest (fetal position). If you have pain with activities such as standing for a long time or reaching with your arms, lie with your spine in a neutral position and bend your knees slightly. Try: Lying on your side with a pillow between your knees. Lying on your back with a pillow under your knees. Follow these instructions at home: Medicines Treatment may include over-the-counter or prescription medicines for pain and inflammation that are taken by mouth or applied to the skin. Another treatment may include muscle relaxants. Take over-the-counter and prescription medicines only as told by your health care provider. Ask your health care provider if the medicine prescribed to you: Requires you to avoid driving or using machinery. Can cause constipation. You may need to take these actions to prevent or treat constipation: Drink enough fluid to keep your urine pale yellow. Take over-the-counter or prescription medicines. Eat foods that are high in fiber, such as beans, whole grains, and fresh fruits and vegetables. Limit foods that are high in fat and processed sugars, such as fried or sweet foods. Lifestyle Do not use any products that contain nicotine or tobacco, such as cigarettes, e-cigarettes, and chewing tobacco. If you need help quitting, ask your health care provider. Eat a healthy diet that includes foods such as vegetables, fruits, fish, and lean meats. Work with your health care provider to achieve or maintain a healthy weight. General instructions Get regular exercise as told. Exercise improves flexibility and strength. If physical therapy was prescribed, do exercises as told by your health care provider. Use ice or heat therapy as told by your health care provider. Keep all follow-up visits as told by your  health care provider. This is important. Where can I get support? Consider joining a support group for people managing chronic back pain. Ask your health care provider about support groups in your area. You can also find online and in-person support groups through: The American Chronic Pain Association: theacpa.org Pain Connection Program: painconnection.org Contact a health care provider if: You have pain that is not relieved with rest or medicine. Your pain gets worse, or you have new pain. You have a fever. You have rapid weight loss. You have trouble doing your normal activities. Get help right away if: You have weakness or numbness in one or both of your legs or feet. You have trouble controlling your bladder or your bowels. You have severe back pain and have any of the following: Nausea or vomiting. Abdominal pain. Shortness of breath or you faint. Summary Chronic back pain is often treated with rest, pain relief, and physical therapy. Talk therapy, acupuncture, massage, and local electrical stimulation may help. Follow your treatment plan as told by your health care provider. Joining a support group may help you manage chronic back pain. This information is not intended to replace advice given to you by your health care provider. Make sure you   discuss any questions you have with your health care provider. Document Revised: 07/12/2019 Document Reviewed: 03/20/2019 Elsevier Patient Education  2023 Elsevier Inc.  

## 2022-02-22 ENCOUNTER — Encounter: Payer: Self-pay | Admitting: Nurse Practitioner

## 2022-02-22 ENCOUNTER — Ambulatory Visit (INDEPENDENT_AMBULATORY_CARE_PROVIDER_SITE_OTHER): Payer: No Typology Code available for payment source | Admitting: Nurse Practitioner

## 2022-02-22 VITALS — BP 121/81 | HR 96 | Temp 98.7°F | Ht 62.0 in | Wt 222.0 lb

## 2022-02-22 DIAGNOSIS — F1120 Opioid dependence, uncomplicated: Secondary | ICD-10-CM | POA: Diagnosis not present

## 2022-02-22 DIAGNOSIS — M961 Postlaminectomy syndrome, not elsewhere classified: Secondary | ICD-10-CM | POA: Diagnosis not present

## 2022-02-22 DIAGNOSIS — Z79899 Other long term (current) drug therapy: Secondary | ICD-10-CM | POA: Insufficient documentation

## 2022-02-22 MED ORDER — HYDROCODONE-ACETAMINOPHEN 7.5-325 MG PO TABS: 1.0000 | ORAL_TABLET | Freq: Three times a day (TID) | ORAL | 0 refills | Status: DC

## 2022-02-22 NOTE — Assessment & Plan Note (Signed)
Refer to chronic opioid plan of care. 

## 2022-02-22 NOTE — Assessment & Plan Note (Signed)
Chronic.  She is between pain clinics, has visit with [facility] upcoming for initial consult.  At this time will continue to bridge her pain medication -- one month refill today and return in one month until we can get into pain clinic.  Contract signed today and UDS obtained.

## 2022-02-22 NOTE — Assessment & Plan Note (Signed)
Signed with patient today -- discussed at length with her.

## 2022-02-22 NOTE — Progress Notes (Signed)
BP 121/81   Pulse 96   Temp 98.7 F (37.1 C) (Oral)   Ht 5\' 2"  (1.575 m)   Wt 222 lb (100.7 kg)   LMP 02/25/2009 (Approximate)   SpO2 97%   BMI 40.60 kg/m    Subjective:    Patient ID: Holly French, female    DOB: 08-05-1975, 46 y.o.   MRN: [id number]  HPI: Holly French is a 46 y.o. female  Chief Complaint  Patient presents with   Pain    4 week f/up   CHRONIC PAIN  Between pain clinics at this time -- her pain management provider at [facility] went to part-time and injections only, so they advised her to find a pain management provider to takeover oral medications.  Referral was [facility] to [facility] who she sent all records to and did not hear from.  [facility] at this time on 03/08/22.  History of lumbar fusion L5-S1 -- 2010, [facility] Neuro feels there is possibility this did not fully fuse == she had recent MRI and CT performed.  Last Noro refill was 01/25/22.  Pain is at baseline 5-6/10.   Present dose:  22.5 Morphine equivalents Pain control status: stable Duration: chronic Location: lower back and down right leg Quality: dull, aching, and throbbing -- numbness across bottom of back Current Pain Level: 6/10 Previous Pain Level: weekend 9/10 with weather changes Breakthrough pain: no Benefit from narcotic medications: yes What Activities task can be accomplished with current medication? Can do all her ADLs and work full-time -- can work overtime Interested in 11/10 off narcotics:no   Stool softners/OTC fiber: no  Previous pain specialty evaluation: yes Non-narcotic analgesic meds: yes Narcotic contract: awaiting pain management visit  Relevant past medical, surgical, family and social history reviewed and updated as indicated. Interim medical history since our last visit reviewed. Allergies and medications reviewed and updated.  Review of Systems  Constitutional:  Negative for activity change, appetite change,  diaphoresis, fatigue and fever.  Respiratory:  Negative for cough, chest tightness and shortness of breath.   Cardiovascular:  Negative for chest pain, palpitations and leg swelling.  Gastrointestinal: Negative.   Neurological: Negative.   Psychiatric/Behavioral: Negative.     Per HPI unless specifically indicated above     Objective:    BP 121/81   Pulse 96   Temp 98.7 F (37.1 C) (Oral)   Ht 5\' 2"  (1.575 m)   Wt 222 lb (100.7 kg)   LMP 02/25/2009 (Approximate)   SpO2 97%   BMI 40.60 kg/m   Wt Readings from Last 3 Encounters:  02/22/22 222 lb (100.7 kg)  01/25/22 225 lb 12.8 oz (102.4 kg)  12/25/21 226 lb (102.5 kg)    Physical Exam Vitals and nursing note reviewed.  Constitutional:      General: She is awake. She is not in acute distress.    Appearance: She is well-developed and well-groomed. She is obese. She is not ill-appearing or toxic-appearing.  HENT:     Head: Normocephalic.     Right Ear: Hearing normal.     Left Ear: Hearing normal.  Eyes:     General: Lids are normal.        Right eye: No discharge.        Left eye: No discharge.     Conjunctiva/sclera: Conjunctivae normal.     Pupils: Pupils are equal, round, and reactive to light.  Neck:  Thyroid: No thyromegaly.     Vascular: No carotid bruit.  Cardiovascular:     Rate and Rhythm: Normal rate and regular rhythm.     Heart sounds: Normal heart sounds. No murmur heard.    No gallop.  Pulmonary:     Effort: Pulmonary effort is normal. No accessory muscle usage or respiratory distress.     Breath sounds: Normal breath sounds.  Abdominal:     General: Bowel sounds are normal.     Palpations: Abdomen is soft.  Musculoskeletal:     Cervical back: Normal range of motion and neck supple.     Right lower leg: No edema.     Left lower leg: No edema.  Skin:    General: Skin is warm and dry.  Neurological:     Mental Status: She is alert and oriented to person, place, and time.  Psychiatric:         Attention and Perception: Attention normal.        Mood and Affect: Mood normal.        Speech: Speech normal.        Behavior: Behavior normal. Behavior is cooperative.        Thought Content: Thought content normal.    Results for orders placed or performed in visit on 08/11/21  Novel Coronavirus, NAA ([facility])   Specimen: Nasopharyngeal(NP) swabs in vial transport medium  Result Value Ref Range   SARS-CoV-2, NAA Not Detected Not Detected  Rapid Strep screen([facility])   Specimen: Other   Other  Result Value Ref Range   Strep Gp A Ag, IA W/Reflex Negative Negative  Culture, Group A Strep   Other  Result Value Ref Range   Strep A Culture Negative   Veritor Flu A/B Waived  Result Value Ref Range   Influenza A Negative Negative   Influenza B Negative Negative      Assessment & Plan:   Problem List Items Addressed This Visit       Other   Continuous opioid dependence (HCC) - Primary    Chronic.  She is between pain clinics, has visit with [facility] upcoming for initial consult.  At this time will continue to bridge her pain medication -- one month refill today and return in one month until we can get into pain clinic.  Contract signed today and UDS obtained.      Relevant Orders   [id number]+Oxyco+Alc+Crt-Bund   Controlled substance agreement signed    Signed with patient today -- discussed at length with her.      Postlaminectomy syndrome, lumbar region    Refer to chronic opioid plan of care.        Follow up plan: Return in about 4 weeks (around 03/22/2022).

## 2022-02-27 LAB — DRUG SCREEN 764883 11+OXYCO+ALC+CRT-BUND
Amphetamines, Urine: NEGATIVE ng/mL
BENZODIAZ UR QL: NEGATIVE ng/mL
Barbiturate: NEGATIVE ng/mL
Cannabinoid Quant, Ur: NEGATIVE ng/mL
Cocaine (Metabolite): NEGATIVE ng/mL
Creatinine: 27.8 mg/dL (ref 20.0–300.0)
Ethanol: NEGATIVE %
Meperidine: NEGATIVE ng/mL
Methadone Screen, Urine: NEGATIVE ng/mL
Oxycodone/Oxymorphone, Urine: NEGATIVE ng/mL
Phencyclidine: NEGATIVE ng/mL
Propoxyphene: NEGATIVE ng/mL
Tramadol: NEGATIVE ng/mL
pH, Urine: 6.1 (ref 4.5–8.9)

## 2022-02-27 LAB — OPIATES CONFIRMATION, URINE: Opiates: NEGATIVE ng/mL

## 2022-03-04 ENCOUNTER — Other Ambulatory Visit: Payer: Self-pay | Admitting: Nurse Practitioner

## 2022-03-04 DIAGNOSIS — M899 Disorder of bone, unspecified: Secondary | ICD-10-CM | POA: Insufficient documentation

## 2022-03-04 DIAGNOSIS — G894 Chronic pain syndrome: Secondary | ICD-10-CM | POA: Insufficient documentation

## 2022-03-04 DIAGNOSIS — Z789 Other specified health status: Secondary | ICD-10-CM | POA: Insufficient documentation

## 2022-03-04 DIAGNOSIS — Z79899 Other long term (current) drug therapy: Secondary | ICD-10-CM | POA: Insufficient documentation

## 2022-03-04 NOTE — Progress Notes (Unsigned)
Patient: Holly French  Service Category: E/M  Provider: Gaspar Cola, MD  DOB: 1975/11/15  DOS: 03/08/2022  Referring Provider: Valerie Roys, DO  MRN: [id number]  Setting: Ambulatory outpatient  PCP: Venita Lick, NP  Type: New Patient  Specialty: Interventional Pain Management    Location: Office  Delivery: Face-to-face     Primary Reason(s) for Visit: Encounter for initial evaluation of one or more chronic problems (new to examiner) potentially causing chronic pain, and posing a threat to normal musculoskeletal function. (Level of risk: High) CC: Back Pain (lower)  HPI  [name] is a 46 y.o. year old, female patient, who comes for the first time to our practice referred by Valerie Roys, DO for our initial evaluation of her chronic pain. She has Endometriosis; Asthma; Chronic low back pain (1ry area of Pain) (Bilateral) (R>L) w/o sciatica; Hypertension; Allergic rhinitis; Vitamin D deficiency; Nicotine dependence, cigarettes, uncomplicated; Obesity; Chronic sinusitis; Anxiety; Continuous opioid dependence ([place]); Postlaminectomy syndrome, lumbar region; Controlled substance agreement signed; Chronic pain syndrome; Pharmacologic therapy; Disorder of skeletal system; Problems influencing health status; Chronic lower extremity pain (2ry area of Pain) (Right); Chronic sacroiliac joint pain (Right); and Chronic hip pain (Left) on their problem list. Today she comes in for evaluation of her Back Pain (lower)  Pain Assessment: Location: Lower Back Radiating: right leg to the foot Onset: More than a month ago Duration: Chronic pain Quality: Burning, Aching, Stabbing, Tightness, Throbbing Severity: 7 /10 (subjective, self-reported pain score)  Effect on ADL: difficulty performing daily activities Timing: Constant Modifying factors: Hydrocodone, Amitriptyline, Cymbalta, Tizanidine BP: (!) 141/85  HR: (!) 114  Onset and Duration: Sudden and Present longer than 3 months Cause of  pain: Work related accident or event Severity: Getting worse, NAS-11 at its worse: 8/10, NAS-11 at its best: 5/10, NAS-11 now: 7/10, and NAS-11 on the average: 6/10 Timing: Not influenced by the time of the day, During activity or exercise, and After activity or exercise Aggravating Factors: Bending, Climbing, Kneeling, Lifiting, Prolonged sitting, Prolonged standing, Squatting, Stooping , Twisting, Walking, Walking uphill, Walking downhill, and Working Alleviating Factors: Hot packs, Medications, Resting, Sitting, and Walking Associated Problems: Fatigue, Numbness, Personality changes, Spasms, Tingling, Weakness, and Pain that wakes patient up Quality of Pain: Aching, Constant, Cramping, Distressing, Exhausting, Heavy, Nagging, Pulsating, Sharp, Stabbing, Throbbing, Tingling, Tiring, and Uncomfortable Previous Examinations or Tests: CT scan, Ct-Myelogram, MRI scan, Myelogram, X-rays, Nerve conduction test, Neurosurgical evaluation, and Orthopedic evaluation Previous Treatments: Epidural steroid injections, Narcotic medications, and Physical Therapy  According to the patient the primary area of pain is that of the lower back (Bilateral) (R>L).  The patient does indicate having had a prior back surgery around 2010 by Dr. Hal Neer.  Currently she refers her neurosurgeon to be Dr. Marcello Moores at [facility].  She refers recently having had imaging studies of her lumbar spine and she also indicates having had physical therapy after her fusion.  The patient indicates having had multiple nerve blocks by Dr. Marlaine Hind at the [facility].  According to the patient Dr. Marlaine Hind has decided to semiretire and he is no longer managing any medications however, he has decided to go and work 3 times per week at an [employer].  The patient is being sent to [place] for medication management.  Today we have explained to the patient that we do not take patients  for medication management only.  According to the patient she has had problems  with her lower back since 2009 secondary to a work-related event.  The patient's secondary area pain is that of the lower extremity (Right).  According to the patient the pattern of the pain is one that runs from her buttocks area all the way down to her heel through the back of the leg but does not get into her foot.  She denies any current numbness but refers that occasionally she will experience some in the bottom of her foot and what appears to be an S1 dermatomal distribution.  According to the patient she has a history of a grade 1 spondylolisthesis of L5 over S1 with a radiculopathy, with a recent for which she had her back surgery and fusion.  She describes having had an EMG/PNCV of the lower extremity done in 2017 at [facility].  In terms of her right leg, she refers having had surgery in 2017 for plantar fasciitis.  Pharmacotherapy the patient indicates taking Elavil, tizanidine, Cymbalta, and hydrocodone 7.5 p.o. 3 times daily.  Physical exam: Positive pain reproduction during Lumbar hyperextension with referring of the pain towards the right lower back.  Hyperextension and rotation as well as Lynelle Smoke maneuver were both positive bilaterally for ipsilateral reproduction of the patient's low back pain and facet arthralgia.  Toe walk and heel walk were both negative for radicular weakness.  Patrick maneuver was positive on the right side for sacroiliac joint arthralgia and on the left side for hip joint arthralgia.  Straight leg raise was negative bilaterally.  Today I took the time to provide the patient with information regarding my pain practice. The patient was informed that my practice is divided into two sections: an interventional pain management section, as well as a completely separate and distinct medication management section. I explained that I have procedure days for my  interventional therapies, and evaluation days for follow-ups and medication management. Because of the amount of documentation required during both, they are kept separated. This means that there is the possibility that she may be scheduled for a procedure on one day, and medication management the next. I have also informed her that because of staffing and facility limitations, I no longer take patients for medication management only. To illustrate the reasons for this, I gave the patient the example of surgeons, and how inappropriate it would be to refer a patient to his/her care, just to write for the post-surgical antibiotics on a surgery done by a different surgeon.   Because interventional pain management is my board-certified specialty, the patient was informed that joining my practice means that they are open to any and all interventional therapies. I made it clear that this does not mean that they will be forced to have any procedures done. What this means is that I believe interventional therapies to be essential part of the diagnosis and proper management of chronic pain conditions. Therefore, patients not interested in these interventional alternatives will be better served under the care of a different practitioner.  The patient was also made aware of my Comprehensive Pain Management Safety Guidelines where by joining my practice, they limit all of their nerve blocks and joint injections to those done by our practice, for as long as we are retained to manage their care.   Historic Controlled Substance Pharmacotherapy Review  PMP and historical list of controlled substances: Hydrocodone/APAP 7.5/325, 1 tab p.o. 3 times daily (# 90) (last filled on 02/25/2022); eszopiclone 3 mg tablet, 1 tab p.o. at bedtime (#30) (  last filled on 01/29/2022); diazepam 5 mg (# 2) (last filled on 08/11/2021) Current opioid analgesics: Hydrocodone/APAP 7.5/325, 1 tab p.o. 3 times daily (# 90) (last filled on  02/25/2022) MME/day: 22.5 mg/day  Historical Monitoring: The patient  reports no history of drug use. List of prior UDS Testing: Lab Results  Component Value Date   PCPQUANT Negative 02/22/2022   CANNABQUANT Negative 02/22/2022   Historical Background Evaluation: [place] PMP: PDMP reviewed during this encounter. Review of the past 51-month conducted. Regular, uninterrupted pattern of monthly opioid refills detected.  According to the review of the PMP, this patient has an uninterrupted use of opioid analgesics and sedative hypnotics that goes back to 11/20/2019.  It is entirely possible that this goes further back, but my review of the PMP was limited to going back as far as 11/20/2019. PMP NARX Score Report:  Narcotic: 471 Sedative: 390 Stimulant: 000 [place] Department of public safety, offender search: (Editor, commissioningInformation) Non-contributory Risk Assessment Profile: Aberrant behavior: None observed or detected today Risk factors for fatal opioid overdose: None identified today PMP NARX Overdose Risk Score: 170 Fatal overdose hazard ratio (HR): Calculation deferred Non-fatal overdose hazard ratio (HR): Calculation deferred Risk of opioid abuse or dependence: 0.7-3.0% with doses ? 36 MME/day and 6.1-26% with doses ? 120 MME/day. Substance use disorder (SUD) risk level: See below Personal History of Substance Abuse (SUD-Substance use disorder):  Alcohol: Negative  Illegal Drugs: Negative  Rx Drugs: Negative  ORT Risk Level calculation: Low Risk  Opioid Risk Tool - 03/08/22 0912       Family History of Substance Abuse   Alcohol Negative    Illegal Drugs Positive Female    Rx Drugs Negative      Personal History of Substance Abuse   Alcohol Negative    Illegal Drugs Negative    Rx Drugs Negative      Age   Age between 192-45years  Yes      History of Preadolescent Sexual Abuse   History of Preadolescent Sexual Abuse Negative or Female      Psychological Disease   Psychological Disease  Negative    Depression Negative      Total Score   Opioid Risk Tool Scoring 3    Opioid Risk Interpretation Low Risk            ORT Scoring interpretation table:  Score <3 = Low Risk for SUD  Score between 4-7 = Moderate Risk for SUD  Score >8 = High Risk for Opioid Abuse   PHQ-2 Depression Scale:  Total score: 0  PHQ-2 Scoring interpretation table: (Score and probability of major depressive disorder)  Score 0 = No depression  Score 1 = 15.4% Probability  Score 2 = 21.1% Probability  Score 3 = 38.4% Probability  Score 4 = 45.5% Probability  Score 5 = 56.4% Probability  Score 6 = 78.6% Probability   PHQ-9 Depression Scale:  Total score: 0  PHQ-9 Scoring interpretation table:  Score 0-4 = No depression  Score 5-9 = Mild depression  Score 10-14 = Moderate depression  Score 15-19 = Moderately severe depression  Score 20-27 = Severe depression (2.4 times higher risk of SUD and 2.89 times higher risk of overuse)   Pharmacologic Plan: As per protocol, I have not taken over any controlled substance management, pending the results of ordered tests and/or consults.            Initial impression: Pending review of available data and ordered tests.  Meds  Current Outpatient Medications:    albuterol (VENTOLIN HFA) 108 (90 Base) MCG/ACT inhaler, INHALE 2 PUFFS INTO THE LUNGS EVERY 6 HOURS AS NEEDED FOR WHEEZING OR SHORTNESS OF BREATH, Disp: 18 g, Rfl: 4   amitriptyline (ELAVIL) 50 MG tablet, TAKE 1 TABLET BY MOUTH AT BEDTIME, Disp: 90 tablet, Rfl: 0   DULoxetine (CYMBALTA) 30 MG capsule, TAKE 1 CAPSULE BY MOUTH TWICE A DAY, Disp: 180 capsule, Rfl: 0   Eszopiclone 3 MG TABS, Take 3 mg by mouth at bedtime as needed., Disp: , Rfl:    fexofenadine (ALLEGRA ALLERGY) 180 MG tablet, Take 1 tablet (180 mg total) by mouth daily., Disp: 10 tablet, Rfl: 1   HYDROcodone-acetaminophen (NORCO) 7.5-325 MG tablet, Take 1 tablet by mouth in the morning, at noon, and at bedtime., Disp: 90 tablet,  Rfl: 0   losartan (COZAAR) 50 MG tablet, Take 2 tablets (100 mg total) by mouth daily., Disp: 180 tablet, Rfl: 4   Melatonin 10 MG CAPS, Take by mouth at bedtime as needed. , Disp: , Rfl:    tiZANidine (ZANAFLEX) 4 MG capsule, Take 1 capsule (4 mg total) by mouth 3 (three) times daily., Disp: 90 capsule, Rfl: 1   triamcinolone ointment (KENALOG) 0.5 %, Apply 1 Application topically 2 (two) times daily., Disp: 90 g, Rfl: 0  Current Facility-Administered Medications:    triamcinolone acetonide (KENALOG-40) injection 40 mg, 40 mg, Intramuscular, Once, Valerie Roys, DO  Imaging Review  Lumbosacral Imaging: Lumbar MR wo contrast: Results for orders placed during the hospital encounter of 12/14/21 MR Lumbar Spine Wo Contrast  Narrative CLINICAL DATA:  46 year old female with persistent low back pain radiating to the right leg. Prior fusion.  EXAM: MRI LUMBAR SPINE WITHOUT CONTRAST  TECHNIQUE: Multiplanar, multisequence MR imaging of the lumbar spine was performed. No intravenous contrast was administered.  COMPARISON:  Lumbar radiographs 12/07/2021.  Lumbar MRI 11/03/2013.  FINDINGS: Segmentation: There appear to be hypoplastic ribs at T12 resulting in otherwise normal lumbar segmentation, the same numbering system was used on the 2015 MRI.  Alignment: Chronically exaggerated lower lumbar lordosis and mild grade 1 anterolisthesis at L5-S1 are stable since 2015.  Vertebrae: Mild hardware susceptibility artifact at L5-S1. No marrow edema or evidence of acute osseous abnormality. Background bone marrow signal is within normal limits. Intact visible sacrum and SI joints.  Conus medullaris and cauda equina: Conus extends to the T12-L1 level. No lower spinal cord or conus signal abnormality. Capacious spinal canal. Cauda equina nerve roots appear normal.  Paraspinal and other soft tissues: Postoperative changes to the lower lumbar paraspinal soft tissues, otherwise  negative.  Disc levels:  Visible lower thoracic levels through L2-L3 remain negative.  L3-L4: Negative disc. Mild to moderate facet hypertrophy greater on the right. No stenosis.  L4-L5: Disc desiccation and mild disc space loss. Increased broad-based posterior disc bulge or protrusion since 2015 with small annular fissure of the disc visible (series 7, image 10). Mild to moderate facet hypertrophy. But no spinal or lateral recess stenosis. No convincing foraminal stenosis.  L5-S1: Chronic decompression and fusion, although questionable arthrodesis here. Capacious spinal canal the disc space level. Sacral epidural lipomatosis. No stenosis.  IMPRESSION: 1. Chronic decompression and fusion at L5-S1 with possible chronic pseudoarthrosis (noncontrast lumbar spine CT would best evaluate further). But no progression of degeneration there since 2015. And no stenosis.  2. Adjacent segment disease at L4-L5 with increased broad-based posterior disc bulge or protrusion, mild to moderate facet hypertrophy. But capacious spinal canal, with no  spinal stenosis or neural impingement.  3. Other visible spinal levels are largely negative. Mild to moderate facet hypertrophy at L3-L4.   Electronically Signed By: Genevie Ann M.D. On: 12/16/2021 08:37  Lumbar MR w/wo contrast: Results for orders placed during the hospital encounter of 11/03/13 MR Lumbar Spine W Wo Contrast  Narrative CLINICAL DATA:  Low back pain.  Right leg pain.  EXAM: MRI LUMBAR SPINE WITHOUT AND WITH CONTRAST  TECHNIQUE: Multiplanar and multiecho pulse sequences of the lumbar spine were obtained without and with intravenous contrast.  CONTRAST:  52m MULTIHANCE GADOBENATE DIMEGLUMINE 529 MG/ML IV SOLN  BUN and creatinine were obtained on site at [facility]  [address]  Results:  BUN 9.0 mg/dL,  Creatinine 0.8 mg/dL.  COMPARISON:  CT lumbar spine 10/06/2009.  FINDINGS: The patient has  undergone posterior and interbody fusion at L5-S1. There is 5 mm anterolisthesis at this level. Bilateral pars defects are present. A pseudarthrosis is present. There is no solid bony bridging across the interspace. There is mild endplate edema with slight postcontrast enhancement of the disc space, but no worrisome signs for infection. Integrity of the hardware not established with MR.  Trace retrolisthesis at L4-5. Otherwise anatomic alignment. Normal conus. Normal paravertebral soft tissues.  The individual disc spaces were examined with axial images as follows:  L1-L2:  Normal.  L2-L3:  Normal.  L3-L4:  Normal.  L4-L5:  Mild bulge.  Mild facet arthropathy.  No impingement.  L5-S1:  Pseudarthrosis.  No residual impingement.  IMPRESSION: L5-S1 pseudarthrosis.  No residual impingement.  Mild bulge L4-5 with mild facet arthropathy.   Electronically Signed By: JRolla FlattenM.D. On: 11/03/2013 16:21  Lumbar CT wo contrast: Results for orders placed during the hospital encounter of 02/18/22 CT LUMBAR SPINE WO CONTRAST  Narrative CLINICAL DATA:  Low back pain with numbness  EXAM: CT LUMBAR SPINE WITHOUT CONTRAST  TECHNIQUE: Multidetector CT imaging of the lumbar spine was performed without intravenous contrast administration. Multiplanar CT image reconstructions were also generated.  RADIATION DOSE REDUCTION: This exam was performed according to the departmental dose-optimization program which includes automated exposure control, adjustment of the mA and/or kV according to patient size and/or use of iterative reconstruction technique.  COMPARISON:  Lumbar MRI 12/14/2021  FINDINGS: Segmentation: 5 lumbar type vertebrae  Alignment: Fused grade 1 anterolisthesis at L5-S1  Vertebrae: L5-S1 PLIF. Some bridging bone is present posteriorly across the narrow disc space. No hardware displacement or fracture. Laminectomy with patent spinal canal. No acute fracture or  bony erosion.  Paraspinal and other soft tissues: Negative for perispinal mass or inflammation.  Disc levels: Degenerative facet spurring with vacuum phenomenon at L4-5 where there is also mild disc bulging. Patent foramina.  IMPRESSION: 1. L5-S1 PLIF with solid fusion. 2. L4-5 facet osteoarthritis. 3. No acute finding or bony impingement.   Electronically Signed By: JJorje GuildM.D. On: 02/20/2022 09:36  Lumbar CT w contrast: Results for orders placed during the hospital encounter of 08/07/14 CT Lumbar Spine W Contrast  Addendum 08/15/2014 11:09 AM ADDENDUM REPORT: 08/15/2014 11:03  ADDENDUM: Correction: Lumbar puncture was performed at L2-3.   Electronically Signed By: ALogan BoresOn: 08/15/2014 11:03  Narrative CLINICAL DATA:  Lumbar intervertebral disc displacement without myelopathy. Posterior right lower extremity pain. Occasional left lower extremity pain.  EXAM: LUMBAR MYELOGRAM  FLUOROSCOPY TIME:  Radiation Exposure Index (as provided by the fluoroscopic device): 327.28 microGray*m2  Fluoroscopy Time (in minutes and seconds):  0 minutes 37  seconds  PROCEDURE: After thorough discussion of risks and benefits of the procedure including bleeding, infection, injury to nerves, blood vessels, adjacent structures as well as headache and CSF leak, written and oral informed consent was obtained. Consent was obtained by Dr. Logan Bores. Time out form was completed.  Patient was positioned prone on the fluoroscopy table. Local anesthesia was provided with 1% lidocaine without epinephrine after prepped and draped in the usual sterile fashion. Puncture was performed at L3-4 using a 3 1/2 inch 22-gauge spinal needle via a right paramedian approach. Using a single pass through the dura, the needle was placed within the thecal sac, with return of clear CSF. 15 mL of Omnipaque-180 was injected into the thecal sac, with normal opacification of the nerve roots and  cauda equina consistent with free flow within the subarachnoid space.  I personally performed the lumbar puncture and administered the intrathecal contrast. I also personally supervised acquisition of the myelogram images.  TECHNIQUE: Contiguous axial images were obtained through the Lumbar spine after the intrathecal infusion of contrast. Coronal and sagittal reconstructions were obtained of the axial image sets.  COMPARISON:  Lumbar spine MRI 11/03/2013  FINDINGS: LUMBAR MYELOGRAM FINDINGS:  Sequelae of prior L5-S1 posterior fusion are again identified. There is minimal retrolisthesis of L3 on L4 and approximately 5-6 mm of anterolisthesis of L5 on S1 without change during flexion or extension. The spinal canal appears widely patent.  CT LUMBAR MYELOGRAM FINDINGS:  There is trace retrolisthesis of L2 on L3, L3 and L4, and L4 on L5, and there is 5 mm anterolisthesis of L5 on S1. Vertebral body heights are preserved. Sequelae of prior posterior and interbody fusion are again identified at L5-S1. Persistent horizontal lucency through the L5-S1 disc space is consistent with pseudoarthrosis. Solid posterolateral fusion masses are also not clearly identified. There is mild disc space narrowing at L4-5, unchanged. Conus medullaris terminates at T12-L1. Paraspinal soft tissues are unremarkable.  L1-2:  Negative.  L2-3:  Minimal disc uncovering.  No stenosis.  L3-4:  Minimal disc uncovering.  No stenosis.  L4-5: Prior partial laminectomies. Mild disc bulging and mild bilateral facet arthrosis without stenosis, unchanged.  L5-S1: Prior wide laminectomies and fusion. No residual stenosis identified.  IMPRESSION: 1. Prior L5-S1 posterior decompression and fusion with pseudoarthrosis. No residual stenosis. 2. Mild disc bulging at L4-5 without stenosis.  Electronically Signed: By: Logan Bores On: 08/07/2014 12:35  Lumbar DG 2-3 views: Results for orders placed during the  hospital encounter of 03/04/09 DG Lumbar Spine 2-3 Views  Narrative Clinical Data: L5-S1 PLIF  LUMBAR SPINE - 2-3 VIEW  Comparison: None  Findings: Two views are received revealing satisfactory appearance of the pedicle screws and interbody fusion material/spacers at L5 and S1.  IMPRESSION: Satisfactory appearance of PLIF at L5-S1.  Provider: Mila Palmer  Lumbar DG (Complete) 4+V: Results for orders placed during the hospital encounter of 12/07/21 DG Lumbar Spine Complete  Narrative CLINICAL DATA:  Chronic low back pain.  Prior fusion  EXAM: LUMBAR SPINE - COMPLETE 4+ VIEW  COMPARISON:  Lumbar spine CT 08/07/2014  FINDINGS: L5-S1 spinal fusion. Preservation of the vertebral body heights. No acute fracture. Hardware intact. SI joints are unremarkable.  IMPRESSION: Lumbar spinal fusion hardware is intact. L5-S1 degenerative changes. No acute process.   Electronically Signed By: Lovey Newcomer M.D. On: 12/07/2021 22:08  Lumbar DG Myelogram Lumbosacral: Results for orders placed during the hospital encounter of 08/07/14 DG MYELOGRAPHY LUMBAR INJ LUMBOSACRAL  Addendum 08/15/2014 11:09 AM ADDENDUM  REPORT: 08/15/2014 11:03  ADDENDUM: Correction: Lumbar puncture was performed at L2-3.   Electronically Signed By: Logan Bores On: 08/15/2014 11:03  Narrative CLINICAL DATA:  Lumbar intervertebral disc displacement without myelopathy. Posterior right lower extremity pain. Occasional left lower extremity pain.  EXAM: LUMBAR MYELOGRAM  FLUOROSCOPY TIME:  Radiation Exposure Index (as provided by the fluoroscopic device): 327.28 microGray*m2  Fluoroscopy Time (in minutes and seconds):  0 minutes 37 seconds  PROCEDURE: After thorough discussion of risks and benefits of the procedure including bleeding, infection, injury to nerves, blood vessels, adjacent structures as well as headache and CSF leak, written and oral informed consent was obtained. Consent was  obtained by Dr. Logan Bores. Time out form was completed.  Patient was positioned prone on the fluoroscopy table. Local anesthesia was provided with 1% lidocaine without epinephrine after prepped and draped in the usual sterile fashion. Puncture was performed at L3-4 using a 3 1/2 inch 22-gauge spinal needle via a right paramedian approach. Using a single pass through the dura, the needle was placed within the thecal sac, with return of clear CSF. 15 mL of Omnipaque-180 was injected into the thecal sac, with normal opacification of the nerve roots and cauda equina consistent with free flow within the subarachnoid space.  I personally performed the lumbar puncture and administered the intrathecal contrast. I also personally supervised acquisition of the myelogram images.  TECHNIQUE: Contiguous axial images were obtained through the Lumbar spine after the intrathecal infusion of contrast. Coronal and sagittal reconstructions were obtained of the axial image sets.  COMPARISON:  Lumbar spine MRI 11/03/2013  FINDINGS: LUMBAR MYELOGRAM FINDINGS:  Sequelae of prior L5-S1 posterior fusion are again identified. There is minimal retrolisthesis of L3 on L4 and approximately 5-6 mm of anterolisthesis of L5 on S1 without change during flexion or extension. The spinal canal appears widely patent.  CT LUMBAR MYELOGRAM FINDINGS:  There is trace retrolisthesis of L2 on L3, L3 and L4, and L4 on L5, and there is 5 mm anterolisthesis of L5 on S1. Vertebral body heights are preserved. Sequelae of prior posterior and interbody fusion are again identified at L5-S1. Persistent horizontal lucency through the L5-S1 disc space is consistent with pseudoarthrosis. Solid posterolateral fusion masses are also not clearly identified. There is mild disc space narrowing at L4-5, unchanged. Conus medullaris terminates at T12-L1. Paraspinal soft tissues are unremarkable.  L1-2:  Negative.  L2-3:  Minimal  disc uncovering.  No stenosis.  L3-4:  Minimal disc uncovering.  No stenosis.  L4-5: Prior partial laminectomies. Mild disc bulging and mild bilateral facet arthrosis without stenosis, unchanged.  L5-S1: Prior wide laminectomies and fusion. No residual stenosis identified.  IMPRESSION: 1. Prior L5-S1 posterior decompression and fusion with pseudoarthrosis. No residual stenosis. 2. Mild disc bulging at L4-5 without stenosis.  Electronically Signed: By: Logan Bores On: 08/07/2014 12:35  Lumbar DG Epidurogram OP: Results for orders placed during the hospital encounter of 08/15/14 DG Epidurography  Narrative CLINICAL DATA:  Postmyelogram positional headache.  FLUOROSCOPY TIME:  Radiation Exposure Index (as provided by the fluoroscopic device): 43.72 microGray*m2  Fluoroscopy Time (in minutes and seconds):  0 minutes 25 seconds  PROCEDURE: LUMBAR EPIDURAL BLOOD PATCH INJECTION  After a thorough discussion of risks and benefits of the procedure, written and verbal consent was obtained. Specific risks included puncture of the thecal sac and dura as well as nontherapeutic injection with general risks of bleeding, infection, injury to nerves, blood vessels, and adjacent structures. Verbal consent was obtained by Dr.  Jeralyn Ruths. We discussed the medium to high probability of achievement of therapeutic goal,  Prior to the procedure, 20 ml of the patient's blood was harvested using stringent sterile technique.  An interlaminar approach was performed at L2-3 on the right. Under stringent sterile technique, overlying skin was cleansed with Betadine soap and anesthetized with 1% lidocaine without epinephrine. A 3.5 inch, 20 gauge needle was advanced using loss-of-resistance technique.  DIAGNOSTIC EPIDURAL INJECTION  Injection of Omnipaque 180 shows a good epidural pattern with spread above and below the level of needle placement, primarily on the side of needle placement. No  vascular or subarachnoid opacification is seen.  THERAPEUTIC EPIDURAL INJECTION  17 ml of the patient's blood was injected into the epidural space at the site of prior lumbar puncture.  IMPRESSION: Technically successful lumbar blood patch at L2-3.   Electronically Signed By: Logan Bores On: 08/15/2014 10:59  Ankle Imaging: Ankle-R DG Complete: Results for orders placed in visit on 03/18/16 DG Ankle Complete Right  Narrative No evidence of acute fracture. Joint spaces maintained.  Ankle-L DG Complete: No results found for this or any previous visit.  Foot Imaging: Foot-R DG Complete: Results for orders placed in visit on 05/01/15 DG Foot Complete Right  Narrative 3 views of a skeletally mature individual were obtained of the right foot.  Study includes AP, oblique, lateral projections.  There is no definitive evidence of acute fracture or stress fracture identified this time. Joint space is maintained. Postop within normal limits.  Complexity Note: Imaging results reviewed.                         ROS  Cardiovascular: High blood pressure Pulmonary or Respiratory: Lung problems, Wheezing and difficulty taking a deep full breath (Asthma), and Smoking Neurological: No reported neurological signs or symptoms such as seizures, abnormal skin sensations, urinary and/or fecal incontinence, being born with an abnormal open spine and/or a tethered spinal cord Psychological-Psychiatric: No reported psychological or psychiatric signs or symptoms such as difficulty sleeping, anxiety, depression, delusions or hallucinations (schizophrenial), mood swings (bipolar disorders) or suicidal ideations or attempts Gastrointestinal: No reported gastrointestinal signs or symptoms such as vomiting or evacuating blood, reflux, heartburn, alternating episodes of diarrhea and constipation, inflamed or scarred liver, or pancreas or irrregular and/or infrequent bowel movements Genitourinary: No  reported renal or genitourinary signs or symptoms such as difficulty voiding or producing urine, peeing blood, non-functioning kidney, kidney stones, difficulty emptying the bladder, difficulty controlling the flow of urine, or chronic kidney disease Hematological: No reported hematological signs or symptoms such as prolonged bleeding, low or poor functioning platelets, bruising or bleeding easily, hereditary bleeding problems, low energy levels due to low hemoglobin or being anemic Endocrine: No reported endocrine signs or symptoms such as high or low blood sugar, rapid heart rate due to high thyroid levels, obesity or weight gain due to slow thyroid or thyroid disease Rheumatologic: No reported rheumatological signs and symptoms such as fatigue, joint pain, tenderness, swelling, redness, heat, stiffness, decreased range of motion, with or without associated rash Musculoskeletal: Negative for myasthenia gravis, muscular dystrophy, multiple sclerosis or malignant hyperthermia Work History: Working full time  Allergies  Ms. Kazmierczak is allergic to covid-19 (mrna) vaccine (pfizer) [covid-19 (mrna) vaccine], neosporin [neomycin-bacitracin zn-polymyx], and latex.  Laboratory Chemistry Profile   Renal Lab Results  Component Value Date   BUN 10 07/13/2021   CREATININE 0.76 07/13/2021   LABCREA 27.8 02/22/2022   BCR 13 07/13/2021  GFRAA 104 04/09/2020   GFRNONAA 90 04/09/2020     Electrolytes Lab Results  Component Value Date   NA 137 07/13/2021   K 4.0 07/13/2021   CL 101 07/13/2021   CALCIUM 9.2 07/13/2021     Hepatic Lab Results  Component Value Date   AST 18 07/13/2021   ALT 18 07/13/2021   ALBUMIN 4.5 07/13/2021   ALKPHOS 95 07/13/2021     ID Lab Results  Component Value Date   HIV Non Reactive 07/13/2021   SARSCOV2NAA Not Detected 08/11/2021     Bone Lab Results  Component Value Date   VD25OH 17.5 (L) 07/13/2021     Endocrine Lab Results  Component Value Date    GLUCOSE 110 (H) 07/13/2021   TSH 1.480 07/13/2021     Neuropathy Lab Results  Component Value Date   HIV Non Reactive 07/13/2021     CNS No results found for: "COLORCSF", "APPEARCSF", "RBCCOUNTCSF", "WBCCSF", "POLYSCSF", "LYMPHSCSF", "EOSCSF", "PROTEINCSF", "GLUCCSF", "JCVIRUS", "CSFOLI", "IGGCSF", "LABACHR", "ACETBL"   Inflammation (CRP: Acute  ESR: Chronic) No results found for: "CRP", "ESRSEDRATE", "LATICACIDVEN"   Rheumatology Lab Results  Component Value Date   LABURIC 3.6 04/11/2015     Coagulation Lab Results  Component Value Date   PLT 331 07/13/2021     Cardiovascular Lab Results  Component Value Date   HGB 13.6 07/13/2021   HCT 40.0 07/13/2021     Screening Lab Results  Component Value Date   SARSCOV2NAA Not Detected 08/11/2021   HIV Non Reactive 07/13/2021     Cancer No results found for: "CEA", "CA125", "LABCA2"   Allergens No results found for: "ALMOND", "APPLE", "ASPARAGUS", "AVOCADO", "BANANA", "BARLEY", "BASIL", "BAYLEAF", "GREENBEAN", "LIMABEAN", "WHITEBEAN", "BEEFIGE", "REDBEET", "BLUEBERRY", "BROCCOLI", "CABBAGE", "MELON", "CARROT", "CASEIN", "CASHEWNUT", "CAULIFLOWER", "CELERY"     Note: Lab results reviewed.  PFSH  Drug: Ms. Leland  reports no history of drug use. Alcohol:  reports no history of alcohol use. Tobacco:  reports that she has been smoking cigarettes. She has a 9.00 pack-year smoking history. She has never used smokeless tobacco. Medical:  has a past medical history of Allergy, Asthma, Chronic urticaria, Dermatitis, Eczema, Hayfever, Hypertension, and Vitamin D deficiency. Family: family history includes Allergies in her daughter; COPD in her maternal grandmother; Cancer in her maternal grandfather; Diabetes in her mother; Hyperlipidemia in her father; Lupus in her maternal grandmother.  Past Surgical History:  Procedure Laterality Date   ABDOMINAL HYSTERECTOMY     EPF and gastroc recession Right 01/14/2016   Per MD notes    laparoscopies     x3   NASAL SINUS SURGERY     x3   PLANTAR FASCIA RELEASE     SPINAL FUSION     lumbar   Active Ambulatory Problems    Diagnosis Date Noted   Endometriosis 01/16/2015   Asthma 01/16/2015   Chronic low back pain (1ry area of Pain) (Bilateral) (R>L) w/o sciatica 01/16/2015   Hypertension 01/16/2015   Allergic rhinitis 01/16/2015   Vitamin D deficiency 01/16/2015   Nicotine dependence, cigarettes, uncomplicated 96/29/5284   Obesity 05/02/2015   Chronic sinusitis 07/17/2020   Anxiety 02/01/2019   Continuous opioid dependence ([place]) 10/22/2013   Postlaminectomy syndrome, lumbar region 12/12/2012   Controlled substance agreement signed 02/22/2022   Chronic pain syndrome 03/04/2022   Pharmacologic therapy 03/04/2022   Disorder of skeletal system 03/04/2022   Problems influencing health status 03/04/2022   Chronic lower extremity pain (2ry area of Pain) (Right) 03/08/2022   Chronic sacroiliac joint pain (  Right) 03/08/2022   Chronic hip pain (Left) 03/08/2022   Resolved Ambulatory Problems    Diagnosis Date Noted   Eczema 01/16/2015   Contact dermatitis 01/16/2015   Urticaria 01/16/2015   Plantar fasciitis 05/22/2015   PTSD (post-traumatic stress disorder) 02/02/2019   Maxillary sinusitis, acute 03/08/2019   Rash 04/09/2020   Insomnia disorder related to known organic factor 12/12/2012   Cough in adult 04/27/2021   Sore throat 04/27/2021   Chest congestion 04/27/2021   Nasal congestion 08/11/2021   Sore throat 08/11/2021   Headache above the eye region 08/11/2021   Sinusitis 08/11/2021   Past Medical History:  Diagnosis Date   Allergy    Chronic urticaria    Dermatitis    Hayfever    Constitutional Exam  General appearance: Well nourished, well developed, and well hydrated. In no apparent acute distress Vitals:   03/08/22 0907  BP: (!) 141/85  Pulse: (!) 114  Resp: 18  Temp: 98.2 F (36.8 C)  TempSrc: Temporal  SpO2: 98%  Weight: 223 lb (101.2  kg)  Height: '5\' 2"'  (1.575 m)   BMI Assessment: Estimated body mass index is 40.79 kg/m as calculated from the following:   Height as of this encounter: '5\' 2"'  (1.575 m).   Weight as of this encounter: 223 lb (101.2 kg).  BMI interpretation table: BMI level Category Range association with higher incidence of chronic pain  <18 kg/m2 Underweight   18.5-24.9 kg/m2 Ideal body weight   25-29.9 kg/m2 Overweight Increased incidence by 20%  30-34.9 kg/m2 Obese (Class I) Increased incidence by 68%  35-39.9 kg/m2 Severe obesity (Class II) Increased incidence by 136%  >40 kg/m2 Extreme obesity (Class III) Increased incidence by 254%   Patient's current BMI Ideal Body weight  Body mass index is 40.79 kg/m. Ideal body weight: 50.1 kg (110 lb 7.2 oz) Adjusted ideal body weight: 70.5 kg (155 lb 7.5 oz)   BMI Readings from Last 4 Encounters:  03/08/22 40.79 kg/m  02/22/22 40.60 kg/m  01/25/22 41.29 kg/m  12/25/21 41.32 kg/m   Wt Readings from Last 4 Encounters:  03/08/22 223 lb (101.2 kg)  02/22/22 222 lb (100.7 kg)  01/25/22 225 lb 12.8 oz (102.4 kg)  12/25/21 226 lb (102.5 kg)    Psych/Mental status: Alert, oriented x 3 (person, place, & time)       Eyes: PERLA Respiratory: No evidence of acute respiratory distress  Assessment  Primary Diagnosis & Pertinent Problem List: The primary encounter diagnosis was Chronic pain syndrome. Diagnoses of Pharmacologic therapy, Disorder of skeletal system, Problems influencing health status, Chronic low back pain (1ry area of Pain) (Bilateral) (R>L) w/o sciatica, Chronic lower extremity pain (2ry area of Pain) (Right), Chronic sacroiliac joint pain (Right), and Chronic hip pain (Left) were also pertinent to this visit.  Visit Diagnosis (New problems to examiner): 1. Chronic pain syndrome   2. Pharmacologic therapy   3. Disorder of skeletal system   4. Problems influencing health status   5. Chronic low back pain (1ry area of Pain) (Bilateral)  (R>L) w/o sciatica   6. Chronic lower extremity pain (2ry area of Pain) (Right)   7. Chronic sacroiliac joint pain (Right)   8. Chronic hip pain (Left)    Plan of Care (Initial workup plan)  Note: Ms. Kauer was reminded that as per protocol, today's visit has been an evaluation only. We have not taken over the patient's controlled substance management.  Problem-specific plan: No problem-specific Assessment & Plan notes found for this  encounter.  Lab Orders         Compliance Drug Analysis, Ur         Comp. Metabolic Panel (12)         Magnesium         Vitamin B12         Sedimentation rate         25-Hydroxy vitamin D Lcms D2+D3         C-reactive protein     Imaging Orders         DG Si Joints         DG HIP UNILAT W OR W/O PELVIS 2-3 VIEWS LEFT         DG Lumbar Spine Complete W/Bend     Referral Orders  No referral(s) requested today   Procedure Orders    No procedure(s) ordered today   Pharmacotherapy (current): Medications ordered:  No orders of the defined types were placed in this encounter.  Medications administered during this visit: Gracin Soohoo. Weesner had no medications administered during this visit.   Pharmacological management options:  Opioid Analgesics: The patient was informed that there is no guarantee that she would be a candidate for opioid analgesics. The decision will be made following CDC guidelines. This decision will be based on the results of diagnostic studies, as well as Ms. Armas's risk profile.   Membrane stabilizer: To be determined at a later time  Muscle relaxant: To be determined at a later time  NSAID: To be determined at a later time  Other analgesic(s): To be determined at a later time   Interventional management options: Ms. Sieloff was informed that there is no guarantee that she would be a candidate for interventional therapies. The decision will be based on the results of diagnostic studies, as well as Ms. Finchum's risk  profile.  Procedure(s) under consideration:  Pending results of ordered studies      Interventional Therapies  Risk  Complexity Considerations:   Estimated body mass index is 40.79 kg/m as calculated from the following:   Height as of this encounter: '5\' 2"'  (1.575 m).   Weight as of this encounter: 223 lb (101.2 kg). WNL   Planned  Pending:      Under consideration:   Pending further evaluation   Completed:   None at this time   Completed by other providers:   History of multiple nerve blocks and interventional therapies by Dr. Marlaine Hind from [facility].   Therapeutic  Palliative (PRN) options:   None established    Provider-requested follow-up: Return for (29mn), Eval-day (M,W), (F2F), 2nd Visit, for review of ordered tests.  Future Appointments  Date Time Provider DLucas 03/22/2022  3:40 PM [facility]ita Lick NP [facility] 05/03/2022  2:00 PM NMilinda Pointer MD [facility] None    Note by: FGaspar Cola MD Date: 03/08/2022; Time: 7:19 AM

## 2022-03-05 MED ORDER — TRIAMCINOLONE ACETONIDE 0.5 % EX OINT: 1.0000 | TOPICAL_OINTMENT | Freq: Two times a day (BID) | CUTANEOUS | 0 refills | Status: DC

## 2022-03-08 ENCOUNTER — Ambulatory Visit
Admission: RE | Admit: 2022-03-08 | Discharge: 2022-03-08 | Disposition: A | Discharge status: Home / Self Care | Payer: No Typology Code available for payment source | Source: Ambulatory Visit | Attending: Pain Medicine | Admitting: Pain Medicine

## 2022-03-08 ENCOUNTER — Encounter: Payer: Self-pay | Admitting: Pain Medicine

## 2022-03-08 ENCOUNTER — Ambulatory Visit (HOSPITAL_BASED_OUTPATIENT_CLINIC_OR_DEPARTMENT_OTHER): Payer: No Typology Code available for payment source | Admitting: Pain Medicine

## 2022-03-08 VITALS — BP 141/85 | HR 114 | Temp 98.2°F | Resp 18 | Ht 62.0 in | Wt 223.0 lb

## 2022-03-08 DIAGNOSIS — M25552 Pain in left hip: Secondary | ICD-10-CM

## 2022-03-08 DIAGNOSIS — M533 Sacrococcygeal disorders, not elsewhere classified: Secondary | ICD-10-CM | POA: Insufficient documentation

## 2022-03-08 DIAGNOSIS — G8929 Other chronic pain: Secondary | ICD-10-CM

## 2022-03-08 DIAGNOSIS — Z789 Other specified health status: Secondary | ICD-10-CM

## 2022-03-08 DIAGNOSIS — G894 Chronic pain syndrome: Secondary | ICD-10-CM | POA: Insufficient documentation

## 2022-03-08 DIAGNOSIS — M899 Disorder of bone, unspecified: Secondary | ICD-10-CM

## 2022-03-08 DIAGNOSIS — M545 Low back pain, unspecified: Secondary | ICD-10-CM

## 2022-03-08 DIAGNOSIS — M79604 Pain in right leg: Secondary | ICD-10-CM | POA: Insufficient documentation

## 2022-03-08 DIAGNOSIS — Z79899 Other long term (current) drug therapy: Secondary | ICD-10-CM | POA: Insufficient documentation

## 2022-03-08 NOTE — Patient Instructions (Signed)
You will have Xrays completed prior to next appointment with pain clinic.

## 2022-03-14 LAB — 25-HYDROXY VITAMIN D LCMS D2+D3
25-Hydroxy, Vitamin D-2: <1 ng/mL
25-Hydroxy, Vitamin D-3: 29 ng/mL
25-Hydroxy, Vitamin D: 29 ng/mL — ABNORMAL LOW

## 2022-03-14 LAB — COMP. METABOLIC PANEL (12)
AST: 18 IU/L (ref 0–40)
Albumin/Globulin Ratio: 1.8 (ref 1.2–2.2)
Albumin: 4.4 g/dL (ref 3.9–4.9)
Alkaline Phosphatase: 83 IU/L (ref 44–121)
BUN/Creatinine Ratio: 10 (ref 9–23)
BUN: 7 mg/dL (ref 6–24)
Bilirubin Total: 0.3 mg/dL (ref 0.0–1.2)
Calcium: 9.4 mg/dL (ref 8.7–10.2)
Chloride: 103 mmol/L (ref 96–106)
Creatinine, Ser: 0.68 mg/dL (ref 0.57–1.00)
Globulin, Total: 2.5 g/dL (ref 1.5–4.5)
Glucose: 86 mg/dL (ref 70–99)
Potassium: 4.4 mmol/L (ref 3.5–5.2)
Sodium: 139 mmol/L (ref 134–144)
Total Protein: 6.9 g/dL (ref 6.0–8.5)
eGFR: 110 mL/min/{1.73_m2} (ref >59)

## 2022-03-14 LAB — COMPLIANCE DRUG ANALYSIS, UR

## 2022-03-14 LAB — MAGNESIUM: Magnesium: 2.1 mg/dL (ref 1.6–2.3)

## 2022-03-14 LAB — C-REACTIVE PROTEIN: CRP: 5 mg/L (ref 0–10)

## 2022-03-14 LAB — SEDIMENTATION RATE: Sed Rate: 11 mm/hr (ref 0–32)

## 2022-03-14 LAB — VITAMIN B12: Vitamin B-12: 373 pg/mL (ref 232–1245)

## 2022-03-21 NOTE — Patient Instructions (Signed)

## 2022-03-22 ENCOUNTER — Encounter: Payer: Self-pay | Admitting: Nurse Practitioner

## 2022-03-22 ENCOUNTER — Ambulatory Visit (INDEPENDENT_AMBULATORY_CARE_PROVIDER_SITE_OTHER): Payer: No Typology Code available for payment source | Admitting: Nurse Practitioner

## 2022-03-22 VITALS — BP 113/74 | HR 99 | Temp 98.1°F | Ht 62.0 in | Wt 219.9 lb

## 2022-03-22 DIAGNOSIS — M961 Postlaminectomy syndrome, not elsewhere classified: Secondary | ICD-10-CM

## 2022-03-22 DIAGNOSIS — F1120 Opioid dependence, uncomplicated: Secondary | ICD-10-CM

## 2022-03-22 MED ORDER — CHOLECALCIFEROL 1.25 MG (50000 UT) PO TABS: 1.0000 | ORAL_TABLET | ORAL | 4 refills | Status: DC

## 2022-03-22 MED ORDER — HYDROCODONE-ACETAMINOPHEN 7.5-325 MG PO TABS: 1.0000 | ORAL_TABLET | Freq: Three times a day (TID) | ORAL | 0 refills | Status: DC

## 2022-03-22 NOTE — Assessment & Plan Note (Signed)
Chronic.  She is between pain clinics, had initial visit with [facility] recently.  At this time will continue to bridge her pain medication -- one month refill today and return in one month until established with [facility] pain clinic fully.  Contract signed in office and UDS up to date.

## 2022-03-22 NOTE — Assessment & Plan Note (Signed)
Refer to chronic opioid plan of care. 

## 2022-03-22 NOTE — Progress Notes (Signed)
BP 113/74   Pulse 99   Temp 98.1 F (36.7 C) (Oral)   Ht '5\' 2"'  (1.575 m)   Wt 219 lb 14.4 oz (99.7 kg)   LMP 02/25/2009 (Approximate)   SpO2 98%   BMI 40.22 kg/m    Subjective:    Patient ID: Holly French, female    DOB: 30-Apr-1976, 46 y.o.   MRN: [id number]  HPI: BRADEE COMMON is a 46 y.o. female  Chief Complaint  Patient presents with   Pain    Patient is here for a four week follow up on Pain.    CHRONIC PAIN  Currently between pain clinics -- her pain management provider at [place] went to part-time and injections only, so they advised her to find a pain management.  Had initial visit with [facility] pain management clinic on 03/08/22, no refills on opioid performed at initial visit -- have not taken over regimen at this time. History of lumbar fusion L5-S1 -- 2010, [place] [facility] feels there is possibility this did not fully fuse == she had recent MRI and CT performed.  Last Norco refill was 02/25/22 for 30 day supply.  Pain is at baseline 5-6/10.  Saw neurosurgery last 03/12/22. Present dose:  22.5 Morphine equivalents Pain control status: stable Duration: chronic Location: lower back and down right leg Quality: dull, aching, and throbbing -- numbness across bottom of back Current Pain Level: 5-6/10 Previous Pain Level: weekend 8-9/10 with weather changes Breakthrough pain: no Benefit from narcotic medications: yes What Activities task can be accomplished with current medication? Can do all her ADLs and work full-time Interested in Careers adviser off narcotics:no   Stool softners/OTC fiber: no  Previous pain specialty evaluation: yes Non-narcotic analgesic meds: yes Narcotic contract: up to date  Relevant past medical, surgical, family and social history reviewed and updated as indicated. Interim medical history since our last visit reviewed. Allergies and medications reviewed and updated.  Review of Systems  Constitutional:  Negative for activity change,  appetite change, diaphoresis, fatigue and fever.  Respiratory:  Negative for cough, chest tightness and shortness of breath.   Cardiovascular:  Negative for chest pain, palpitations and leg swelling.  Gastrointestinal: Negative.   Neurological: Negative.   Psychiatric/Behavioral: Negative.     Per HPI unless specifically indicated above     Objective:    BP 113/74   Pulse 99   Temp 98.1 F (36.7 C) (Oral)   Ht '5\' 2"'  (1.575 m)   Wt 219 lb 14.4 oz (99.7 kg)   LMP 02/25/2009 (Approximate)   SpO2 98%   BMI 40.22 kg/m   Wt Readings from Last 3 Encounters:  03/22/22 219 lb 14.4 oz (99.7 kg)  03/08/22 223 lb (101.2 kg)  02/22/22 222 lb (100.7 kg)    Physical Exam Vitals and nursing note reviewed.  Constitutional:      General: She is awake. She is not in acute distress.    Appearance: She is well-developed and well-groomed. She is obese. She is not ill-appearing or toxic-appearing.  HENT:     Head: Normocephalic.     Right Ear: Hearing normal.     Left Ear: Hearing normal.  Eyes:     General: Lids are normal.        Right eye: No discharge.        Left eye: No discharge.     Conjunctiva/sclera: Conjunctivae normal.     Pupils: Pupils are equal, round, and reactive to light.  Neck:  Thyroid: No thyromegaly.     Vascular: No carotid bruit.  Cardiovascular:     Rate and Rhythm: Normal rate and regular rhythm.     Heart sounds: Normal heart sounds. No murmur heard.    No gallop.  Pulmonary:     Effort: Pulmonary effort is normal. No accessory muscle usage or respiratory distress.     Breath sounds: Normal breath sounds.  Abdominal:     General: Bowel sounds are normal.     Palpations: Abdomen is soft.  Musculoskeletal:     Cervical back: Normal range of motion and neck supple.     Right lower leg: No edema.     Left lower leg: No edema.  Skin:    General: Skin is warm and dry.  Neurological:     Mental Status: She is alert and oriented to person, place, and time.   Psychiatric:        Attention and Perception: Attention normal.        Mood and Affect: Mood normal.        Speech: Speech normal.        Behavior: Behavior normal. Behavior is cooperative.        Thought Content: Thought content normal.    Results for orders placed or performed in visit on 03/08/22  Compliance Drug Analysis, Ur  Result Value Ref Range   Summary Note   Comp. Metabolic Panel (12)  Result Value Ref Range   Glucose 86 70 - 99 mg/dL   BUN 7 6 - 24 mg/dL   Creatinine, Ser 0.68 0.57 - 1.00 mg/dL   eGFR 110 >59 mL/min/1.73   BUN/Creatinine Ratio 10 9 - 23   Sodium 139 134 - 144 mmol/L   Potassium 4.4 3.5 - 5.2 mmol/L   Chloride 103 96 - 106 mmol/L   Calcium 9.4 8.7 - 10.2 mg/dL   Total Protein 6.9 6.0 - 8.5 g/dL   Albumin 4.4 3.9 - 4.9 g/dL   Globulin, Total 2.5 1.5 - 4.5 g/dL   Albumin/Globulin Ratio 1.8 1.2 - 2.2   Bilirubin Total 0.3 0.0 - 1.2 mg/dL   Alkaline Phosphatase 83 44 - 121 IU/L   AST 18 0 - 40 IU/L  Magnesium  Result Value Ref Range   Magnesium 2.1 1.6 - 2.3 mg/dL  Vitamin B12  Result Value Ref Range   Vitamin B-12 373 232 - 1,245 pg/mL  Sedimentation rate  Result Value Ref Range   Sed Rate 11 0 - 32 mm/hr  25-Hydroxy vitamin D Lcms D2+D3  Result Value Ref Range   25-Hydroxy, Vitamin D 29 (L) ng/mL   25-Hydroxy, Vitamin D-2 <1.0 ng/mL   25-Hydroxy, Vitamin D-3 29 ng/mL  C-reactive protein  Result Value Ref Range   CRP 5 0 - 10 mg/L      Assessment & Plan:   Problem List Items Addressed This Visit       Other   Postlaminectomy syndrome, lumbar region (Chronic)    Refer to chronic opioid plan of care.      Continuous opioid dependence ([place]) - Primary    Chronic.  She is between pain clinics, had initial visit with [facility] recently.  At this time will continue to bridge her pain medication -- one month refill today and return in one month until established with [facility] pain clinic fully.  Contract signed in office and UDS up to date.         Follow up plan: Return in about 5 weeks (  around 04/27/2022) for Chronic Pain.

## 2022-04-18 ENCOUNTER — Other Ambulatory Visit: Payer: Self-pay | Admitting: Nurse Practitioner

## 2022-04-18 DIAGNOSIS — F1120 Opioid dependence, uncomplicated: Secondary | ICD-10-CM

## 2022-04-18 DIAGNOSIS — M961 Postlaminectomy syndrome, not elsewhere classified: Secondary | ICD-10-CM

## 2022-04-18 NOTE — Patient Instructions (Signed)

## 2022-04-19 NOTE — Telephone Encounter (Signed)
Requested Prescriptions  Pending Prescriptions Disp Refills   amitriptyline (ELAVIL) 50 MG tablet [Pharmacy Med Name: AMITRIPTYLINE HCL 50 MG TAB] 90 tablet 0    Sig: TAKE 1 TABLET BY MOUTH AT BEDTIME     Psychiatry:  Antidepressants - Heterocyclics (TCAs) Passed - 04/18/2022  6:51 AM      Passed - Valid encounter within last 6 months    Recent Outpatient Visits           4 weeks ago Continuous opioid dependence ([place])   [place], Jolene T, NP   1 month ago Continuous opioid dependence ([place])   [place], [place] T, NP   2 months ago Continuous opioid dependence ([place])   Jakin, [place] T, NP   3 months ago Continuous opioid dependence ([place])   [place], [place] T, NP   4 months ago Continuous opioid dependence ([place])   Rives, [place], DO       Future Appointments             In 4 days Cannady, Barbaraann Faster, NP [employer], [facility]

## 2022-04-21 ENCOUNTER — Other Ambulatory Visit: Payer: Self-pay | Admitting: Nurse Practitioner

## 2022-04-21 DIAGNOSIS — F1120 Opioid dependence, uncomplicated: Secondary | ICD-10-CM

## 2022-04-21 DIAGNOSIS — M961 Postlaminectomy syndrome, not elsewhere classified: Secondary | ICD-10-CM

## 2022-04-21 DIAGNOSIS — F419 Anxiety disorder, unspecified: Secondary | ICD-10-CM

## 2022-04-21 NOTE — Telephone Encounter (Signed)
Requested Prescriptions  Pending Prescriptions Disp Refills   DULoxetine (CYMBALTA) 30 MG capsule [Pharmacy Med Name: DULoxetine HCL DR 30 MG CAPSULE] 180 capsule 0    Sig: TAKE 1 CAPSULE BY MOUTH TWICE A DAY     Psychiatry: Antidepressants - SNRI - duloxetine Passed - 04/21/2022  6:22 AM      Passed - Cr in normal range and within 360 days    Creatinine  Date Value Ref Range Status  02/22/2022 27.8 20.0 - 300.0 mg/dL Final   Creatinine, Ser  Date Value Ref Range Status  03/08/2022 0.68 0.57 - 1.00 mg/dL Final         Passed - eGFR is 30 or above and within 360 days    GFR calc Af Amer  Date Value Ref Range Status  04/09/2020 104 >59 mL/min/1.73 Final    Comment:    **In accordance with recommendations from the NKF-ASN Task force,**   [facility] is in the process of updating its eGFR calculation to the   2021 CKD-EPI creatinine equation that estimates kidney function   without a race variable.    GFR calc non Af Amer  Date Value Ref Range Status  04/09/2020 90 >59 mL/min/1.73 Final   eGFR  Date Value Ref Range Status  03/08/2022 110 >59 mL/min/1.73 Final         Passed - Completed PHQ-2 or PHQ-9 in the last 360 days      Passed - Last BP in normal range    BP Readings from Last 1 Encounters:  03/22/22 113/74         Passed - Valid encounter within last 6 months    Recent Outpatient Visits           1 month ago Continuous opioid dependence ([place])   [place], [place] T, NP   1 month ago Continuous opioid dependence ([place])   [place], [place] T, NP   2 months ago Continuous opioid dependence ([place])   [place], [place] T, NP   3 months ago Continuous opioid dependence ([place])   [place], [place] T, NP   4 months ago Continuous opioid dependence ([place])   [place], [place], DO       Future Appointments             In 2 days Cannady, Barbaraann Faster, NP [employer]

## 2022-04-23 ENCOUNTER — Ambulatory Visit (INDEPENDENT_AMBULATORY_CARE_PROVIDER_SITE_OTHER): Payer: No Typology Code available for payment source | Admitting: Nurse Practitioner

## 2022-04-23 ENCOUNTER — Encounter: Payer: Self-pay | Admitting: Nurse Practitioner

## 2022-04-23 VITALS — BP 110/74 | HR 98 | Temp 98.0°F | Ht 62.01 in | Wt 217.5 lb

## 2022-04-23 DIAGNOSIS — M961 Postlaminectomy syndrome, not elsewhere classified: Secondary | ICD-10-CM | POA: Diagnosis not present

## 2022-04-23 DIAGNOSIS — F1120 Opioid dependence, uncomplicated: Secondary | ICD-10-CM | POA: Diagnosis not present

## 2022-04-23 MED ORDER — HYDROCODONE-ACETAMINOPHEN 7.5-325 MG PO TABS: 1.0000 | ORAL_TABLET | Freq: Three times a day (TID) | ORAL | 0 refills | Status: DC

## 2022-04-23 NOTE — Progress Notes (Signed)
BP 110/74   Pulse 98   Temp 98 F (36.7 C) (Oral)   Ht 5' 2.01" (1.575 m)   Wt 217 lb 8 oz (98.7 kg)   LMP 02/25/2009 (Approximate)   SpO2 98%   BMI 39.77 kg/m    Subjective:    Patient ID: Holly French, female    DOB: 08-Feb-1976, 46 y.o.   MRN: [id number]  HPI: Holly French is a 46 y.o. female  Chief Complaint  Patient presents with   Back Pain    Chronic, here for follow up   CHRONIC PAIN  Currently between pain clinics -- her pain management provider at [place] went to part-time and injections only, so they advised her to find a pain management.  Initial visit with [facility] pain management clinic on 03/08/22, no refills on opioid performed at initial visit -- have not taken over regimen at this time. History of lumbar fusion L5-S1 -- 2010, [place] [facility] feels there is possibility this did not fully fuse == she had recent MRI and CT performed.  Last Norco refill was 03/26/22 for 30 day supply.  Pain is at baseline 5-6/10.  Saw neurosurgery last 03/12/22, to return if pain gets worse. Present dose:  22.5 Morphine equivalents Pain control status: stable Duration: chronic Location: lower back and down right leg Quality: dull, aching, and throbbing -- numbness across bottom of back Current Pain Level: 5-6/10 Previous Pain Levels: 8-9/10 with weather changes Breakthrough pain: no Benefit from narcotic medications: yes What Activities task can be accomplished with current medication? Can do all her ADLs and work full-time Interested in Careers adviser off narcotics:no   Stool softners/OTC fiber: no  Previous pain specialty evaluation: yes Non-narcotic analgesic meds: yes Narcotic contract: up to date  Relevant past medical, surgical, family and social history reviewed and updated as indicated. Interim medical history since our last visit reviewed. Allergies and medications reviewed and updated.  Review of Systems  Constitutional:  Negative for activity change,  appetite change, diaphoresis, fatigue and fever.  Respiratory:  Negative for cough, chest tightness and shortness of breath.   Cardiovascular:  Negative for chest pain, palpitations and leg swelling.  Gastrointestinal: Negative.   Neurological: Negative.   Psychiatric/Behavioral: Negative.     Per HPI unless specifically indicated above     Objective:    BP 110/74   Pulse 98   Temp 98 F (36.7 C) (Oral)   Ht 5' 2.01" (1.575 m)   Wt 217 lb 8 oz (98.7 kg)   LMP 02/25/2009 (Approximate)   SpO2 98%   BMI 39.77 kg/m   Wt Readings from Last 3 Encounters:  04/23/22 217 lb 8 oz (98.7 kg)  03/22/22 219 lb 14.4 oz (99.7 kg)  03/08/22 223 lb (101.2 kg)    Physical Exam Vitals and nursing note reviewed.  Constitutional:      General: She is awake. She is not in acute distress.    Appearance: She is well-developed and well-groomed. She is obese. She is not ill-appearing or toxic-appearing.  HENT:     Head: Normocephalic.     Right Ear: Hearing normal.     Left Ear: Hearing normal.  Eyes:     General: Lids are normal.        Right eye: No discharge.        Left eye: No discharge.     Conjunctiva/sclera: Conjunctivae normal.     Pupils: Pupils are equal, round, and reactive to light.  Neck:  Thyroid: No thyromegaly.     Vascular: No carotid bruit.  Cardiovascular:     Rate and Rhythm: Normal rate and regular rhythm.     Heart sounds: Normal heart sounds. No murmur heard.    No gallop.  Pulmonary:     Effort: Pulmonary effort is normal. No accessory muscle usage or respiratory distress.     Breath sounds: Normal breath sounds.  Abdominal:     General: Bowel sounds are normal.     Palpations: Abdomen is soft.  Musculoskeletal:     Cervical back: Normal range of motion and neck supple.     Right lower leg: No edema.     Left lower leg: No edema.  Skin:    General: Skin is warm and dry.  Neurological:     Mental Status: She is alert and oriented to person, place, and  time.  Psychiatric:        Attention and Perception: Attention normal.        Mood and Affect: Mood normal.        Speech: Speech normal.        Behavior: Behavior normal. Behavior is cooperative.        Thought Content: Thought content normal.    Results for orders placed or performed in visit on 03/08/22  Compliance Drug Analysis, Ur  Result Value Ref Range   Summary Note   Comp. Metabolic Panel (12)  Result Value Ref Range   Glucose 86 70 - 99 mg/dL   BUN 7 6 - 24 mg/dL   Creatinine, Ser 0.68 0.57 - 1.00 mg/dL   eGFR 110 >59 mL/min/1.73   BUN/Creatinine Ratio 10 9 - 23   Sodium 139 134 - 144 mmol/L   Potassium 4.4 3.5 - 5.2 mmol/L   Chloride 103 96 - 106 mmol/L   Calcium 9.4 8.7 - 10.2 mg/dL   Total Protein 6.9 6.0 - 8.5 g/dL   Albumin 4.4 3.9 - 4.9 g/dL   Globulin, Total 2.5 1.5 - 4.5 g/dL   Albumin/Globulin Ratio 1.8 1.2 - 2.2   Bilirubin Total 0.3 0.0 - 1.2 mg/dL   Alkaline Phosphatase 83 44 - 121 IU/L   AST 18 0 - 40 IU/L  Magnesium  Result Value Ref Range   Magnesium 2.1 1.6 - 2.3 mg/dL  Vitamin B12  Result Value Ref Range   Vitamin B-12 373 232 - 1,245 pg/mL  Sedimentation rate  Result Value Ref Range   Sed Rate 11 0 - 32 mm/hr  25-Hydroxy vitamin D Lcms D2+D3  Result Value Ref Range   25-Hydroxy, Vitamin D 29 (L) ng/mL   25-Hydroxy, Vitamin D-2 <1.0 ng/mL   25-Hydroxy, Vitamin D-3 29 ng/mL  C-reactive protein  Result Value Ref Range   CRP 5 0 - 10 mg/L      Assessment & Plan:   Problem List Items Addressed This Visit       Other   Postlaminectomy syndrome, lumbar region (Chronic)    Refer to chronic opioid plan of care.      Continuous opioid dependence ([place]) - Primary    Chronic.  She is between pain clinics, had initial visit with [facility] recently.  At this time will continue to bridge her pain medication -- one month refill today and return in one month until established with [facility] pain clinic fully.  Contract signed in office and UDS up to  date.        Follow up plan: Return in about 3 months (  around 07/14/2022) for Annual physical.

## 2022-04-23 NOTE — Assessment & Plan Note (Signed)
Chronic.  She is between pain clinics, had initial visit with [facility] recently.  At this time will continue to bridge her pain medication -- one month refill today and return in one month until established with [facility] pain clinic fully.  Contract signed in office and UDS up to date.

## 2022-04-23 NOTE — Assessment & Plan Note (Signed)
Refer to chronic opioid plan of care. 

## 2022-05-02 NOTE — Progress Notes (Addendum)
PROVIDER NOTE: Information contained herein reflects review and annotations entered in association with encounter. Interpretation of such information and data should be left to medically-trained personnel. Information provided to patient can be located elsewhere in the medical record under "Patient Instructions". Document created using STT-dictation technology, any transcriptional errors that may result from process are unintentional.    Patient: Holly French  Service Category: E/M  Provider: Gaspar Cola, MD  DOB: [birth year]  DOS: 05/03/2022  Referring Provider: Venita Lick, NP  MRN: [id number]  Specialty: Interventional Pain Management  PCP: Venita Lick, NP  Type: Established Patient  Setting: Ambulatory outpatient    Location: Office  Delivery: Face-to-face     Primary Reason(s) for Visit: Encounter for evaluation before starting new chronic pain management plan of care (Level of risk: moderate) CC: Back Pain  HPI  Holly French is a 46 y.o. year old, female patient, who comes today for a follow-up evaluation to review the test results and decide on a treatment plan. She has Endometriosis; Asthma; Chronic low back pain (1ry area of Pain) (Bilateral) (R>L) w/o sciatica; Hypertension; Allergic rhinitis; Vitamin D deficiency; Nicotine dependence, cigarettes, uncomplicated; Obesity; Chronic sinusitis; Anxiety; Continuous opioid dependence (Jet); Postlaminectomy syndrome, lumbar region; Controlled substance agreement signed; Chronic pain syndrome; Pharmacologic therapy; Disorder of skeletal system; Problems influencing health status; Chronic lower extremity pain (2ry area of Pain) (Right); Chronic sacroiliac joint pain (Right); Chronic hip pain (Left); Latex precautions, history of latex allergy; History of allergy to latex; Failed back surgical syndrome; Lumbar facet arthropathy; Lumbar facet syndrome; Lumbar facet joint pain; and Pseudoarthrosis of lumbar spine (L5-S1) on their problem  list. Her primarily concern today is the Back Pain  Pain Assessment: Location: Lower, Right, Left Back Radiating: Radaites into right leg from hip into right foot Onset: More than a month ago Duration: Chronic pain Quality: Aching, Burning, Tightness, Throbbing, Stabbing Severity: 6 /10 (subjective, self-reported pain score)  Effect on ADL: 'Slows me down but I still try to work 40-50 hours a week" Timing: Constant Modifying factors: Medications: Hydrocodone, Amitriptyline, Cymbalta, Tizanidine BP: 134/78  HR: (!) 107  Holly French comes in today for a follow-up visit after her initial evaluation on 03/08/2022. Today we went over the results of her tests. These were explained in "Layman's terms". During today's appointment we went over my diagnostic impression, as well as the proposed treatment plan.  Review of initial evaluation (03/08/2022): "According to the patient the primary area of pain is that of the lower back (Bilateral) (R>L).  The patient does indicate having had a prior back surgery around 2010 by Dr. Hal Neer.  Currently she refers her neurosurgeon to be Dr. Marcello Moores at [facility].  She refers recently having had imaging studies of her lumbar spine and she also indicates having had physical therapy after her fusion.  The patient indicates having had multiple nerve blocks by Dr. Marlaine Hind at the [facility].  According to the patient Dr. Marlaine Hind has decided to semiretire and he is no longer managing any medications however, he has decided to go and work 3 times per week at an [employer].  The patient is being sent to [place] for medication management.  Today we have explained to the patient that we do not take patients for medication management only.  According to the patient she has had problems with her lower back since 2009 secondary to a work-related event.  The patient's secondary area pain is  that of the lower  extremity (Right).  According to the patient the pattern of the pain is one that runs from her buttocks area all the way down to her heel through the back of the leg but does not get into her foot.  She denies any current numbness but refers that occasionally she will experience some in the bottom of her foot and what appears to be an S1 dermatomal distribution.  According to the patient she has a history of a grade 1 spondylolisthesis of L5 over S1 with a radiculopathy, with a recent for which she had her back surgery and fusion.  She describes having had an EMG/PNCV of the lower extremity done in 2017 at [facility].  In terms of her right leg, she refers having had surgery in 2017 for plantar fasciitis.  Pharmacotherapy the patient indicates taking Elavil, tizanidine, Cymbalta, and hydrocodone 7.5 p.o. 3 times daily.  Initial physical exam: Positive pain reproduction during Lumbar hyperextension with referring of the pain towards the right lower back.  Hyperextension and rotation as well as Lynelle Smoke maneuver were both positive bilaterally for ipsilateral reproduction of the patient's low back pain and facet arthralgia.  Toe walk and heel walk were both negative for radicular weakness.  Patrick maneuver was positive on the right side for sacroiliac joint arthralgia and on the left side for hip joint arthralgia.  Straight leg raise was negative bilaterally."  Review of the electronic medical record shows that [facility] has not sent any of the procedure notes from Dr. Brien Few. Pending for [facility] to send all of the procedure notes from Dr. Brien Few.  EMG/PNCV done at [facility] is also still not available.  Review of the patient's lab work shows that she still has a vitamin D insufficiency.  X-rays of the left hip and pelvis were read as negative.  X-rays of the lumbar spine on flexion and extension show evidence of a posterior fusion at the L5-S1  level with no radiographical evidence of loosening.  There is also evidence of L4-5 DDD.  There is still a slight anterolisthesis of L5 over S1 that has not changed.  Review of the CT of the lumbar spine done on 02/18/2022 shows the L5-S1 PLIF with solid fusion as well as Lumbar facet osteoarthritis affecting the L4-5 facet joints.  Today's physical exam: The patient was able to toe walk and heel walk without any problems.  Straight leg raise was relatively within normal limits except for decreased range of motion on both sides.  In the case of the right side the patient did experience some discomfort in the area of the right low back as well as some referred pain down the back of the upper leg down to the knee.  Indicates of the left side the patient experienced some lower extremity discomfort/pain in the posterior aspect of the upper leg down to the knee.  No radicular pattern on either side.  Lumbar spine flexion showed decreased range of motion and positive low back pain, bilaterally.  No lower extremity pain.  Hyperextension of the lumbar spine was positive for rib duction of the low back pain across the lower back with the right side being worse than the left.  Hyperextension and rotation of the lumbar spine as well as the Kemp maneuver were positive bilaterally for ipsilateral low back pain exactly reproducing the patient's low back pain with the right side being worse than the left.  Provocative Patrick maneuver was positive  bilaterally for hip joint arthralgia with decreased range of motion on the left hip.  He was also positive on the right side for sacroiliac joint arthralgia.  My impression today is that what predominates today is a bilateral lumbar facet syndrome with the right side being worse than the left.  Currently the patient is not experiencing any type of radicular pain.  She denies any pain below the knees.  Denies any dermatomal pain, numbness, or any type of lower extremity  weakness.  Ms. Harten was informed that I am currently unable to take patients for medication management. If interested, she will be offered a pharmacotherapy evaluation, including recommendations. Treatment plan offered is in alignment with my interventional pain management specialty.   Controlled Substance Pharmacotherapy Assessment REMS (Risk Evaluation and Mitigation Strategy)  Opioid Analgesic: Hydrocodone/APAP 7.5/325, 1 tab p.o. 3 times daily (# 90) (last filled on 04/24/2022) MME/day: 22.5 mg/day  Pill Count: None expected due to no prior prescriptions written by our practice. Al Decant, RN  05/03/2022  2:17 PM  Sign when Signing Visit Safety precautions to be maintained throughout the outpatient stay will include: orient to surroundings, keep bed in low position, maintain call bell within reach at all times, provide assistance with transfer out of bed and ambulation.    Pharmacokinetics: Liberation and absorption (onset of action): WNL Distribution (time to peak effect): WNL Metabolism and excretion (duration of action): WNL         Pharmacodynamics: Desired effects: Analgesia: Ms. Hott reports >50% benefit. Functional ability: Patient reports that medication allows her to accomplish basic ADLs Clinically meaningful improvement in function (CMIF): Sustained CMIF goals met Perceived effectiveness: Described as relatively effective, allowing for increase in activities of daily living (ADL) Undesirable effects: Side-effects or Adverse reactions: None reported Monitoring: [place] PMP: PDMP reviewed during this encounter. Online review of the past 83-monthperiod previously conducted. Not applicable at this point since we have not taken over the patient's medication management yet. List of other Serum/Urine Drug Screening Test(s):  Lab Results  Component Value Date   CANNABQUANT Negative 02/22/2022   List of all UDS test(s) done:  Lab Results  Component Value Date   SUMMARY  Note 03/08/2022   Last UDS on record: Summary  Date Value Ref Range Status  03/08/2022 Note  Final    Comment:    ==================================================================== Compliance Drug Analysis, Ur ==================================================================== Test                             Result       Flag       Units  Drug Present and Declared for Prescription Verification   Hydrocodone                    628          EXPECTED   ng/mg creat   Hydromorphone                  183          EXPECTED   ng/mg creat   Dihydrocodeine                 334          EXPECTED   ng/mg creat   Norhydrocodone                 2641         EXPECTED   ng/mg creat  Sources of hydrocodone include scheduled prescription medications.    Hydromorphone, dihydrocodeine and norhydrocodone are expected    metabolites of hydrocodone. Hydromorphone and dihydrocodeine are    also available as scheduled prescription medications.    Nortriptyline                  PRESENT      EXPECTED    Nortriptyline may be administered as a prescription drug; it is also    an expected metabolite of amitriptyline.    Acetaminophen                  PRESENT      EXPECTED  Drug Present not Declared for Prescription Verification   Diphenhydramine                PRESENT      UNEXPECTED  Drug Absent but Declared for Prescription Verification   Tizanidine                     Not Detected UNEXPECTED    Tizanidine, as indicated in the declared medication list, is not    always detected even when used as directed.    Zopiclone/Eszopiclone          Not Detected UNEXPECTED    Eszopiclone, as indicated in the declared medication list, is not    always detected even when used as directed.    Amitriptyline                  Not Detected UNEXPECTED    Amitriptyline is almost always present in patients taking this drug    consistently. Absence of amitriptyline could be due to lapse of time    since the last dose  or unusual pharmacokinetics (rapid metabolism).    Duloxetine                     Not Detected UNEXPECTED ==================================================================== Test                      Result    Flag   Units      Ref Range   Creatinine              29               mg/dL      >=20 ==================================================================== Declared Medications:  The flagging and interpretation on this report are based on the  following declared medications.  Unexpected results may arise from  inaccuracies in the declared medications.   **Note: The testing scope of this panel includes these medications:   Amitriptyline (Elavil)  Duloxetine (Cymbalta)  Hydrocodone (Norco)   **Note: The testing scope of this panel does not include small to  moderate amounts of these reported medications:   Acetaminophen (Norco)  Eszopiclone  Tizanidine (Zanaflex)   **Note: The testing scope of this panel does not include the  following reported medications:   Albuterol (Ventolin HFA)  Fexofenadine (Allegra)  Losartan (Cozaar)  Melatonin  Triamcinolone (Kenalog) ==================================================================== For clinical consultation, please call [phone]. ====================================================================    UDS interpretation: No unexpected findings.          Medication Assessment Form: Not applicable. No opioids. Treatment compliance: Not applicable Risk Assessment Profile: Aberrant behavior: See initial evaluations. None observed or detected today Comorbid factors increasing risk of overdose: See initial evaluation. No additional risks detected today Opioid risk tool (ORT):     05/03/2022  2:16 PM  Opioid Risk   Alcohol 0  Illegal Drugs 2  Rx Drugs 0  Alcohol 0  Illegal Drugs 0  Rx Drugs 0  Age between 16-45 years  1  History of Preadolescent Sexual Abuse 0  Psychological Disease 0  Depression 0  Opioid  Risk Tool Scoring 3  Opioid Risk Interpretation Low Risk    ORT Scoring interpretation table:  Score <3 = Low Risk for SUD  Score between 4-7 = Moderate Risk for SUD  Score >8 = High Risk for Opioid Abuse   Risk of substance use disorder (SUD): Low  Risk Mitigation Strategies:  Patient opioid safety counseling: No controlled substances prescribed. Patient-Prescriber Agreement (PPA): No agreement signed.  Controlled substance notification to other providers: None required. No opioid therapy.  Pharmacologic Plan: Non-opioid analgesic therapy offered. Interventional alternatives discussed.             Laboratory Chemistry Profile   Renal Lab Results  Component Value Date   BUN 7 03/08/2022   CREATININE 0.68 03/08/2022   LABCREA 27.8 02/22/2022   BCR 10 03/08/2022   GFRAA 104 04/09/2020   GFRNONAA 90 04/09/2020     Electrolytes Lab Results  Component Value Date   NA 139 03/08/2022   K 4.4 03/08/2022   CL 103 03/08/2022   CALCIUM 9.4 03/08/2022   MG 2.1 03/08/2022     Hepatic Lab Results  Component Value Date   AST 18 03/08/2022   ALT 18 07/13/2021   ALBUMIN 4.4 03/08/2022   ALKPHOS 83 03/08/2022     ID Lab Results  Component Value Date   HIV Non Reactive 07/13/2021   SARSCOV2NAA Not Detected 08/11/2021     Bone Lab Results  Component Value Date   VD25OH 17.5 (L) 07/13/2021   25OHVITD1 29 (L) 03/08/2022   25OHVITD2 <1.0 03/08/2022   25OHVITD3 29 03/08/2022     Endocrine Lab Results  Component Value Date   GLUCOSE 86 03/08/2022   TSH 1.480 07/13/2021     Neuropathy Lab Results  Component Value Date   VITAMINB12 373 03/08/2022   HIV Non Reactive 07/13/2021     CNS No results found for: "COLORCSF", "APPEARCSF", "RBCCOUNTCSF", "WBCCSF", "POLYSCSF", "LYMPHSCSF", "EOSCSF", "PROTEINCSF", "GLUCCSF", "JCVIRUS", "CSFOLI", "IGGCSF", "LABACHR", "ACETBL"   Inflammation (CRP: Acute  ESR: Chronic) Lab Results  Component Value Date   CRP 5 03/08/2022    ESRSEDRATE 11 03/08/2022     Rheumatology Lab Results  Component Value Date   LABURIC 3.6 04/11/2015     Coagulation Lab Results  Component Value Date   PLT 331 07/13/2021     Cardiovascular Lab Results  Component Value Date   HGB 13.6 07/13/2021   HCT 40.0 07/13/2021     Screening Lab Results  Component Value Date   SARSCOV2NAA Not Detected 08/11/2021   HIV Non Reactive 07/13/2021     Cancer No results found for: "CEA", "CA125", "LABCA2"   Allergens No results found for: "ALMOND", "APPLE", "ASPARAGUS", "AVOCADO", "BANANA", "BARLEY", "BASIL", "BAYLEAF", "GREENBEAN", "LIMABEAN", "WHITEBEAN", "BEEFIGE", "REDBEET", "BLUEBERRY", "BROCCOLI", "CABBAGE", "MELON", "CARROT", "CASEIN", "CASHEWNUT", "CAULIFLOWER", "CELERY"     Note: Lab results reviewed.  Recent Diagnostic Imaging Review  Lumbosacral Imaging: Lumbar MR wo contrast: Results for orders placed during the hospital encounter of 12/14/21 MR Lumbar Spine Wo Contrast  Narrative CLINICAL DATA:  46 year old female with persistent low back pain radiating to the right leg. Prior fusion.  EXAM: MRI LUMBAR SPINE WITHOUT CONTRAST  TECHNIQUE: Multiplanar, multisequence MR imaging of the lumbar  spine was performed. No intravenous contrast was administered.  COMPARISON:  Lumbar radiographs 12/07/2021.  Lumbar MRI 11/03/2013.  FINDINGS: Segmentation: There appear to be hypoplastic ribs at T12 resulting in otherwise normal lumbar segmentation, the same numbering system was used on the 2015 MRI.  Alignment: Chronically exaggerated lower lumbar lordosis and mild grade 1 anterolisthesis at L5-S1 are stable since 2015.  Vertebrae: Mild hardware susceptibility artifact at L5-S1. No marrow edema or evidence of acute osseous abnormality. Background bone marrow signal is within normal limits. Intact visible sacrum and SI joints.  Conus medullaris and cauda equina: Conus extends to the T12-L1 level. No lower spinal cord or  conus signal abnormality. Capacious spinal canal. Cauda equina nerve roots appear normal.  Paraspinal and other soft tissues: Postoperative changes to the lower lumbar paraspinal soft tissues, otherwise negative.  Disc levels:  Visible lower thoracic levels through L2-L3 remain negative.  L3-L4: Negative disc. Mild to moderate facet hypertrophy greater on the right. No stenosis.  L4-L5: Disc desiccation and mild disc space loss. Increased broad-based posterior disc bulge or protrusion since 2015 with small annular fissure of the disc visible (series 7, image 10). Mild to moderate facet hypertrophy. But no spinal or lateral recess stenosis. No convincing foraminal stenosis.  L5-S1: Chronic decompression and fusion, although questionable arthrodesis here. Capacious spinal canal the disc space level. Sacral epidural lipomatosis. No stenosis.  IMPRESSION: 1. Chronic decompression and fusion at L5-S1 with possible chronic pseudoarthrosis (noncontrast lumbar spine CT would best evaluate further). But no progression of degeneration there since 2015. And no stenosis.  2. Adjacent segment disease at L4-L5 with increased broad-based posterior disc bulge or protrusion, mild to moderate facet hypertrophy. But capacious spinal canal, with no spinal stenosis or neural impingement.  3. Other visible spinal levels are largely negative. Mild to moderate facet hypertrophy at L3-L4.  Electronically Signed By: Genevie Ann M.D. On: 12/16/2021 08:37  Lumbar CT wo contrast: Results for orders placed during the hospital encounter of 02/18/22 CT LUMBAR SPINE WO CONTRAST  Narrative CLINICAL DATA:  Low back pain with numbness  EXAM: CT LUMBAR SPINE WITHOUT CONTRAST  TECHNIQUE: Multidetector CT imaging of the lumbar spine was performed without intravenous contrast administration. Multiplanar CT image reconstructions were also generated.  RADIATION DOSE REDUCTION: This exam was performed  according to the departmental dose-optimization program which includes automated exposure control, adjustment of the mA and/or kV according to patient size and/or use of iterative reconstruction technique.  COMPARISON:  Lumbar MRI 12/14/2021  FINDINGS: Segmentation: 5 lumbar type vertebrae  Alignment: Fused grade 1 anterolisthesis at L5-S1  Vertebrae: L5-S1 PLIF. Some bridging bone is present posteriorly across the narrow disc space. No hardware displacement or fracture. Laminectomy with patent spinal canal. No acute fracture or bony erosion.  Paraspinal and other soft tissues: Negative for perispinal mass or inflammation.  Disc levels: Degenerative facet spurring with vacuum phenomenon at L4-5 where there is also mild disc bulging. Patent foramina.  IMPRESSION: 1. L5-S1 PLIF with solid fusion. 2. L4-5 facet osteoarthritis. 3. No acute finding or bony impingement.   Electronically Signed By: Jorje Guild M.D. On: 02/20/2022 09:36  Lumbar DG Bending views: Results for orders placed during the hospital encounter of 03/08/22 DG Lumbar Spine Complete W/Bend  Narrative CLINICAL DATA:  Chronic pain to the right SI joint, left hip, and lumbar spine  EXAM: LUMBAR SPINE - COMPLETE WITH BENDING VIEWS  COMPARISON:  CT 02/18/2022  FINDINGS: No acute fracture or traumatic malalignment. Posterior fusion L5-S1 with interbody  spacer. No radiographic evidence of loosening. Slight anterolisthesis L5-S1 is unchanged. Mild disc space height loss at L4-L5. No instability on flexion or extension views.  IMPRESSION: No acute fracture or traumatic malalignment.  Posterior fusion L5-S1.  No radiographic evidence of loosening.  L4-L5 degenerative disc disease.  No evidence of instability on flexion or extension.  Electronically Signed By: Placido Sou M.D. On: 03/09/2022 00:02         Hip Imaging: Hip-L DG 2-3 views: Results for orders placed during the hospital  encounter of 03/08/22 DG HIP UNILAT W OR W/O PELVIS 2-3 VIEWS LEFT  Narrative CLINICAL DATA:  Sacroiliac joint pain  EXAM: DG HIP (WITH OR WITHOUT PELVIS) 2-3V LEFT  COMPARISON:  CT 02/18/2022  FINDINGS: There is no evidence of hip fracture or dislocation. There is no evidence of arthropathy or other focal bone abnormality. Unremarkable sacroiliac joints. Lumbar spine fusion hardware.  IMPRESSION: Negative.   Electronically Signed By: Placido Sou M.D. On: 03/08/2022 23:58  Ankle Imaging: Ankle-R DG Complete: Results for orders placed in visit on 03/18/16 DG Ankle Complete Right  Narrative No evidence of acute fracture. Joint spaces maintained.  Foot Imaging: Foot-R DG Complete: Results for orders placed in visit on 05/01/15 DG Foot Complete Right  Narrative 3 views of a skeletally mature individual were obtained of the right foot.  Study includes AP, oblique, lateral projections.  There is no definitive evidence of acute fracture or stress fracture identified this time. Joint space is maintained. Postop within normal limits.  Complexity Note: Imaging results reviewed.                        Meds   Current Outpatient Medications:    albuterol (VENTOLIN HFA) 108 (90 Base) MCG/ACT inhaler, INHALE 2 PUFFS INTO THE LUNGS EVERY 6 HOURS AS NEEDED FOR WHEEZING OR SHORTNESS OF BREATH, Disp: 18 g, Rfl: 4   amitriptyline (ELAVIL) 50 MG tablet, TAKE 1 TABLET BY MOUTH AT BEDTIME, Disp: 90 tablet, Rfl: 0   Cholecalciferol 1.25 MG (50000 UT) TABS, Take 1 tablet by mouth once a week., Disp: 12 tablet, Rfl: 4   DULoxetine (CYMBALTA) 30 MG capsule, TAKE 1 CAPSULE BY MOUTH TWICE A DAY, Disp: 180 capsule, Rfl: 0   Eszopiclone 3 MG TABS, Take 1 tablet (3 mg total) by mouth at bedtime as needed., Disp: 30 tablet, Rfl: 2   fexofenadine (ALLEGRA ALLERGY) 180 MG tablet, Take 1 tablet (180 mg total) by mouth daily., Disp: 10 tablet, Rfl: 1   HYDROcodone-acetaminophen (NORCO) 7.5-325 MG  tablet, Take 1 tablet by mouth in the morning, at noon, and at bedtime., Disp: 90 tablet, Rfl: 0   losartan (COZAAR) 50 MG tablet, Take 2 tablets (100 mg total) by mouth daily., Disp: 180 tablet, Rfl: 4   Melatonin 10 MG CAPS, Take by mouth at bedtime as needed. , Disp: , Rfl:    tiZANidine (ZANAFLEX) 4 MG capsule, Take 1 capsule (4 mg total) by mouth 3 (three) times daily., Disp: 90 capsule, Rfl: 1   triamcinolone ointment (KENALOG) 0.5 %, Apply 1 Application topically 2 (two) times daily., Disp: 90 g, Rfl: 0  Current Facility-Administered Medications:    triamcinolone acetonide (KENALOG-40) injection 40 mg, 40 mg, Intramuscular, Once, Johnson, Megan P, DO  ROS  Constitutional: Denies any fever or chills Gastrointestinal: No reported hemesis, hematochezia, vomiting, or acute GI distress Musculoskeletal: Denies any acute onset joint swelling, redness, loss of ROM, or weakness Neurological: No reported episodes  of acute onset apraxia, aphasia, dysarthria, agnosia, amnesia, paralysis, loss of coordination, or loss of consciousness  Allergies  Ms. Yarrow is allergic to covid-19 (mrna) vaccine (pfizer) [covid-19 (mrna) vaccine], neosporin [neomycin-bacitracin zn-polymyx], and latex.  PFSH  Drug: Ms. Such  reports no history of drug use. Alcohol:  reports no history of alcohol use. Tobacco:  reports that she has been smoking cigarettes. She has a 9.00 pack-year smoking history. She has never used smokeless tobacco. Medical:  has a past medical history of Allergy, Asthma, Chronic urticaria, Dermatitis, Eczema, Hayfever, Hypertension, and Vitamin D deficiency. Surgical: Ms. Sereno  has a past surgical history that includes Abdominal hysterectomy; Nasal sinus surgery; laparoscopies; Spinal fusion; EPF and gastroc recession (Right, 01/14/2016); and Plantar fascia release. Family: family history includes Allergies in her daughter; COPD in her maternal grandmother; Cancer in her maternal  grandfather; Diabetes in her mother; Hyperlipidemia in her father; Lupus in her maternal grandmother.  Constitutional Exam  General appearance: Well nourished, well developed, and well hydrated. In no apparent acute distress Vitals:   05/03/22 1412  BP: 134/78  Pulse: (!) 107  Resp: 18  Temp: 98.1 F (36.7 C)  TempSrc: Temporal  SpO2: 97%  Weight: 217 lb (98.4 kg)  Height: 5' 2" (1.575 m)   BMI Assessment: Estimated body mass index is 39.69 kg/m as calculated from the following:   Height as of this encounter: 5' 2" (1.575 m).   Weight as of this encounter: 217 lb (98.4 kg).  BMI interpretation table: BMI level Category Range association with higher incidence of chronic pain  <18 kg/m2 Underweight   18.5-24.9 kg/m2 Ideal body weight   25-29.9 kg/m2 Overweight Increased incidence by 20%  30-34.9 kg/m2 Obese (Class I) Increased incidence by 68%  35-39.9 kg/m2 Severe obesity (Class II) Increased incidence by 136%  >40 kg/m2 Extreme obesity (Class III) Increased incidence by 254%   Patient's current BMI Ideal Body weight  Body mass index is 39.69 kg/m. Ideal body weight: 50.1 kg (110 lb 7.2 oz) Adjusted ideal body weight: 69.4 kg (153 lb 1.1 oz)   BMI Readings from Last 4 Encounters:  05/03/22 39.69 kg/m  04/23/22 39.77 kg/m  03/22/22 40.22 kg/m  03/08/22 40.79 kg/m   Wt Readings from Last 4 Encounters:  05/03/22 217 lb (98.4 kg)  04/23/22 217 lb 8 oz (98.7 kg)  03/22/22 219 lb 14.4 oz (99.7 kg)  03/08/22 223 lb (101.2 kg)    Psych/Mental status: Alert, oriented x 3 (person, place, & time)       Eyes: PERLA Respiratory: No evidence of acute respiratory distress  Assessment & Plan  Primary Diagnosis & Pertinent Problem List: The primary encounter diagnosis was Chronic pain syndrome. Diagnoses of Chronic low back pain (1ry area of Pain) (Bilateral) (R>L) w/o sciatica, Chronic lower extremity pain (2ry area of Pain) (Right), Postlaminectomy syndrome, lumbar region,  Failed back surgical syndrome, Lumbar facet arthropathy, Lumbar facet syndrome, Lumbar facet joint pain, Pseudoarthrosis of lumbar spine (L5-S1), Chronic sacroiliac joint pain (Right), and History of allergy to latex were also pertinent to this visit.  Visit Diagnosis: 1. Chronic pain syndrome   2. Chronic low back pain (1ry area of Pain) (Bilateral) (R>L) w/o sciatica   3. Chronic lower extremity pain (2ry area of Pain) (Right)   4. Postlaminectomy syndrome, lumbar region   5. Failed back surgical syndrome   6. Lumbar facet arthropathy   7. Lumbar facet syndrome   8. Lumbar facet joint pain   9. Pseudoarthrosis of lumbar  spine (L5-S1)   10. Chronic sacroiliac joint pain (Right)   11. History of allergy to latex    Problems updated and reviewed during this visit: Problem  Failed Back Surgical Syndrome  Lumbar facet arthropathy  Lumbar facet syndrome  Lumbar Facet Joint Pain  Pseudoarthrosis of lumbar spine (L5-S1)  Latex Precautions, History of Latex Allergy  History of Allergy to Latex    Plan of Care  Pharmacotherapy (Medications Ordered): No orders of the defined types were placed in this encounter.  Procedure Orders         LUMBAR FACET(MEDIAL BRANCH NERVE BLOCK) MBNB     Lab Orders  No laboratory test(s) ordered today   Imaging Orders  No imaging studies ordered today   Referral Orders  No referral(s) requested today    Pharmacological management:  Opioid Analgesics: I will not be prescribing any opioids at this time Membrane stabilizer: I will not be prescribing any at this time Muscle relaxant: I will not be prescribing any at this time NSAID: I will not be prescribing any at this time Other analgesic(s): I will not be prescribing any at this time      Interventional Therapies  Risk  Complexity Considerations:   Estimated body mass index is 40.79 kg/m as calculated from the following:   Height as of this encounter: 5' 2" (1.575 m).   Weight as of this  encounter: 223 lb (101.2 kg).    WNL   Planned  Pending:   Diagnostic bilateral lumbar facet MBB #1  Pending for [place] [facility] to send all of the procedure notes from Dr. Brien Few.   Under consideration:   Diagnostic bilateral lumbar facet MBB #1  Possible bilateral lumbar facet RFA  Diagnostic caudal ESI #1 + epidurogram  Possible Racz procedure    Completed:   None at this time   Completed by other providers:   History of multiple nerve blocks and interventional therapies by Dr. Marlaine Hind from [facility]. Diagnostic/therapeutic right S1 TFESI    Therapeutic  Palliative (PRN) options:   None established    Provider-requested follow-up: Return for (ECT): (B) L-FCT Blk #1. Recent Visits Date Type Provider Dept  03/08/22 Office Visit Milinda Pointer, MD [facility]  Showing recent visits within past 90 days and meeting all other requirements Today's Visits Date Type Provider Dept  05/03/22 Office Visit Milinda Pointer, MD [facility]  Showing today's visits and meeting all other requirements Future Appointments No visits were found meeting these conditions. Showing future appointments within next 90 days and meeting all other requirements  Time spent: 71 minutes.  Primary Care Physician: Venita Lick, NP Note by: Gaspar Cola, MD Date: 05/03/2022; Time: 2:46 PM

## 2022-05-03 ENCOUNTER — Encounter: Payer: Self-pay | Admitting: Pain Medicine

## 2022-05-03 ENCOUNTER — Other Ambulatory Visit: Payer: Self-pay

## 2022-05-03 ENCOUNTER — Ambulatory Visit: Payer: No Typology Code available for payment source | Attending: Pain Medicine | Admitting: Pain Medicine

## 2022-05-03 VITALS — BP 134/78 | HR 107 | Temp 98.1°F | Resp 18 | Ht 62.0 in | Wt 217.0 lb

## 2022-05-03 DIAGNOSIS — Z91040 Latex allergy status: Secondary | ICD-10-CM | POA: Insufficient documentation

## 2022-05-03 DIAGNOSIS — M47816 Spondylosis without myelopathy or radiculopathy, lumbar region: Secondary | ICD-10-CM | POA: Diagnosis not present

## 2022-05-03 DIAGNOSIS — M533 Sacrococcygeal disorders, not elsewhere classified: Secondary | ICD-10-CM | POA: Insufficient documentation

## 2022-05-03 DIAGNOSIS — S32009K Unspecified fracture of unspecified lumbar vertebra, subsequent encounter for fracture with nonunion: Secondary | ICD-10-CM | POA: Diagnosis present

## 2022-05-03 DIAGNOSIS — M5459 Other low back pain: Secondary | ICD-10-CM | POA: Diagnosis not present

## 2022-05-03 DIAGNOSIS — M545 Low back pain, unspecified: Secondary | ICD-10-CM | POA: Insufficient documentation

## 2022-05-03 DIAGNOSIS — M961 Postlaminectomy syndrome, not elsewhere classified: Secondary | ICD-10-CM | POA: Diagnosis not present

## 2022-05-03 DIAGNOSIS — G894 Chronic pain syndrome: Secondary | ICD-10-CM | POA: Insufficient documentation

## 2022-05-03 DIAGNOSIS — M79604 Pain in right leg: Secondary | ICD-10-CM | POA: Diagnosis present

## 2022-05-03 DIAGNOSIS — G8929 Other chronic pain: Secondary | ICD-10-CM | POA: Diagnosis present

## 2022-05-03 MED ORDER — ESZOPICLONE 3 MG PO TABS: 3.0000 mg | ORAL_TABLET | Freq: Every evening | ORAL | 2 refills | Status: DC | PRN

## 2022-05-03 NOTE — Progress Notes (Signed)
Safety precautions to be maintained throughout the outpatient stay will include: orient to surroundings, keep bed in low position, maintain call bell within reach at all times, provide assistance with transfer out of bed and ambulation.  

## 2022-05-03 NOTE — Telephone Encounter (Signed)
Medication refill for Eszopiclone 3mg  last ov 04/23/22, upcoming ov 07/26/22 . Please advise. This was last prescribed my a historical provider, no you

## 2022-05-03 NOTE — Patient Instructions (Addendum)
Facet Blocks Patient Information  Description: The facets are joints in the spine between the vertebrae.  Like any joints in the body, facets can become irritated and painful.  Arthritis can also effect the facets.  By injecting steroids and local anesthetic in and around these joints, we can temporarily block the nerve supply to them.  Steroids act directly on irritated nerves and tissues to reduce selling and inflammation which often leads to decreased pain.  Facet blocks may be done anywhere along the spine from the neck to the low back depending upon the location of your pain.   After numbing the skin with local anesthetic (like Novocaine), a small needle is passed onto the facet joints under x-ray guidance.  You may experience a sensation of pressure while this is being done.  The entire block usually lasts about 15-25 minutes.   Conditions which may be treated by facet blocks:  Low back/buttock pain Neck/shoulder pain Certain types of headaches  Preparation for the injection:  Do not eat any solid food or dairy products within 8 hours of your appointment. You may drink clear liquid up to 3 hours before appointment.  Clear liquids include water, black coffee, juice or soda.  No milk or cream please. You may take your regular medication, including pain medications, with a sip of water before your appointment.  Diabetics should hold regular insulin (if taken separately) and take 1/2 normal NPH dose the morning of the procedure.  Carry some sugar containing items with you to your appointment. A driver must accompany you and be prepared to drive you home after your procedure. Bring all your current medications with you. An IV may be inserted and sedation may be given at the discretion of the physician. A blood pressure cuff, EKG and other monitors will often be applied during the procedure.  Some patients may need to have extra oxygen administered for a short period. You will be asked to  provide medical information, including your allergies and medications, prior to the procedure.  We must know immediately if you are taking blood thinners (like Coumadin/Warfarin) or if you are allergic to IV iodine contrast (dye).  We must know if you could possible be pregnant.  Possible side-effects:  Bleeding from needle site Infection (rare, may require surgery) Nerve injury (rare) Numbness & tingling (temporary) Difficulty urinating (rare, temporary) Spinal headache (a headache worse with upright posture) Light-headedness (temporary) Pain at injection site (serveral days) Decreased blood pressure (rare, temporary) Weakness in arm/leg (temporary) Pressure sensation in back/neck (temporary)   Call if you experience:  Fever/chills associated with headache or increased back/neck pain Headache worsened by an upright position New onset, weakness or numbness of an extremity below the injection site Hives or difficulty breathing (go to the emergency room) Inflammation or drainage at the injection site(s) Severe back/neck pain greater than usual New symptoms which are concerning to you  Please note:  Although the local anesthetic injected can often make your back or neck feel good for several hours after the injection, the pain will likely return. It takes 3-7 days for steroids to work.  You may not notice any pain relief for at least one week.  If effective, we will often do a series of 2-3 injections spaced 3-6 weeks apart to maximally decrease your pain.  After the initial series, you may be a candidate for a more permanent nerve block of the facets.  If you have any questions, please call [phone] [facility]   Pain Clinic ______________________________________________________________________  Preparing for your procedure  During your procedure appointment there will be: No Prescription Refills. No disability issues to discussed. No medication  changes or discussions.  Instructions: Food intake: Avoid eating anything solid for at least 8 hours prior to your procedure. Clear liquid intake: You may take clear liquids such as water up to 2 hours prior to your procedure. (No carbonated drinks. No soda.) Transportation: Unless otherwise stated by your physician, bring a driver. Morning Medicines: Except for blood thinners, take all of your other morning medications with a sip of water. Make sure to take your heart and blood pressure medicines. If your blood pressure's lower number is above 100, the case will be rescheduled. Blood thinners: If you take a blood thinner, but were not instructed to stop it, call our office [phone] and ask to talk to a nurse. Not stopping a blood thinner prior to certain procedures could lead to serious complications. Diabetics on insulin: Notify the staff so that you can be scheduled 1st case in the morning. If your diabetes requires high dose insulin, take only  of your normal insulin dose the morning of the procedure and notify the staff that you have done so. Preventing infections: Shower with an antibacterial soap the morning of your procedure.  Build-up your immune system: Take 1000 mg of Vitamin C with every meal (3 times a day) the day prior to your procedure. Antibiotics: Inform the nursing staff if you are taking any antibiotics or if you have any conditions that may require antibiotics prior to procedures. (Example: recent joint implants)   Pregnancy: If you are pregnant make sure to notify the nursing staff. Not doing so may result in injury to the fetus, including death.  Sickness: If you have a cold, fever, or any active infections, call and cancel or reschedule your procedure. Receiving steroids while having an infection may result in complications. Arrival: You must be in the facility at least 30 minutes prior to your scheduled procedure. Tardiness: Your scheduled time is also the cutoff time.  If you do not arrive at least 15 minutes prior to your procedure, you will be rescheduled.  Children: Do not bring any children with you. Make arrangements to keep them home. Dress appropriately: There is always a possibility that your clothing may get soiled. Avoid long dresses. Valuables: Do not bring any jewelry or valuables.  Reasons to call and reschedule or cancel your procedure: (Following these recommendations will minimize the risk of a serious complication.) Surgeries: Avoid having procedures within 2 weeks of any surgery. (Avoid for 2 weeks before or after any surgery). Flu Shots: Avoid having procedures within 2 weeks of a flu shots or . (Avoid for 2 weeks before or after immunizations). Barium: Avoid having a procedure within 7-10 days after having had a radiological study involving the use of radiological contrast. (Myelograms, Barium swallow or enema study). Heart attacks: Avoid any elective procedures or surgeries for the initial 6 months after a "Myocardial Infarction" (Heart Attack). Blood thinners: It is imperative that you stop these medications before procedures. Let us know if you if you take any blood thinner.  Infection: Avoid procedures during or within two weeks of an infection (including chest colds or gastrointestinal problems). Symptoms associated with infections include: Localized redness, fever, chills, night sweats or profuse sweating, burning sensation when voiding, cough, congestion, stuffiness, runny nose, sore throat, diarrhea, nausea, vomiting, cold or Flu symptoms, recent or current infections. It is specially important if   the infection is over the area that we intend to treat. Heart and lung problems: Symptoms that may suggest an active cardiopulmonary problem include: cough, chest pain, breathing difficulties or shortness of breath, dizziness, ankle swelling, uncontrolled high or unusually low blood pressure, and/or palpitations. If you are experiencing any of  these symptoms, cancel your procedure and contact your primary care physician for an evaluation.  Remember:  Regular Business hours are:  Monday to Thursday 8:00 AM to 4:00 PM  Provider's Schedule: Francisco Naveira, MD:  Procedure days: Tuesday and Thursday 7:30 AM to 4:00 PM  Bilal Lateef, MD:  Procedure days: Monday and Wednesday 7:30 AM to 4:00 PM  ______________________________________________________________________    ____________________________________________________________________________________________  General Risks and Possible Complications  Patient Responsibilities: It is important that you read this as it is part of your informed consent. It is our duty to inform you of the risks and possible complications associated with treatments offered to you. It is your responsibility as a patient to read this and to ask questions about anything that is not clear or that you believe was not covered in this document.  Patient's Rights: You have the right to refuse treatment. You also have the right to change your mind, even after initially having agreed to have the treatment done. However, under this last option, if you wait until the last second to change your mind, you may be charged for the materials used up to that point.  Introduction: Medicine is not an exact science. Everything in Medicine, including the lack of treatment(s), carries the potential for danger, harm, or loss (which is by definition: Risk). In Medicine, a complication is a secondary problem, condition, or disease that can aggravate an already existing one. All treatments carry the risk of possible complications. The fact that a side effects or complications occurs, does not imply that the treatment was conducted incorrectly. It must be clearly understood that these can happen even when everything is done following the highest safety standards.  No treatment: You can choose not to proceed with the proposed  treatment alternative. The "PRO(s)" would include: avoiding the risk of complications associated with the therapy. The "CON(s)" would include: not getting any of the treatment benefits. These benefits fall under one of three categories: diagnostic; therapeutic; and/or palliative. Diagnostic benefits include: getting information which can ultimately lead to improvement of the disease or symptom(s). Therapeutic benefits are those associated with the successful treatment of the disease. Finally, palliative benefits are those related to the decrease of the primary symptoms, without necessarily curing the condition (example: decreasing the pain from a flare-up of a chronic condition, such as incurable terminal cancer).  General Risks and Complications: These are associated to most interventional treatments. They can occur alone, or in combination. They fall under one of the following six (6) categories: no benefit or worsening of symptoms; bleeding; infection; nerve damage; allergic reactions; and/or death. No benefits or worsening of symptoms: In Medicine there are no guarantees, only probabilities. No healthcare provider can ever guarantee that a medical treatment will work, they can only state the probability that it may. Furthermore, there is always the possibility that the condition may worsen, either directly, or indirectly, as a consequence of the treatment. Bleeding: This is more common if the patient is taking a blood thinner, either prescription or over the counter (example: Goody Powders, Fish oil, Aspirin, Garlic, etc.), or if suffering a condition associated with impaired coagulation (example: Hemophilia, cirrhosis of the liver, low platelet counts, etc.).   However, even if you do not have one on these, it can still happen. If you have any of these conditions, or take one of these drugs, make sure to notify your treating physician. Infection: This is more common in patients with a compromised immune  system, either due to disease (example: diabetes, cancer, human immunodeficiency virus [HIV], etc.), or due to medications or treatments (example: therapies used to treat cancer and rheumatological diseases). However, even if you do not have one on these, it can still happen. If you have any of these conditions, or take one of these drugs, make sure to notify your treating physician. Nerve Damage: This is more common when the treatment is an invasive one, but it can also happen with the use of medications, such as those used in the treatment of cancer. The damage can occur to small secondary nerves, or to large primary ones, such as those in the spinal cord and brain. This damage may be temporary or permanent and it may lead to impairments that can range from temporary numbness to permanent paralysis and/or brain death. Allergic Reactions: Any time a substance or material comes in contact with our body, there is the possibility of an allergic reaction. These can range from a mild skin rash (contact dermatitis) to a severe systemic reaction (anaphylactic reaction), which can result in death. Death: In general, any medical intervention can result in death, most of the time due to an unforeseen complication. ____________________________________________________________________________________________    

## 2022-05-11 ENCOUNTER — Telehealth: Payer: Self-pay

## 2022-05-11 NOTE — Telephone Encounter (Signed)
Insurance Treatment Denial Note  Date order was entered:  Order entered by: Milinda Pointer, MD Requested treatment: lumbar facets Reason for denial:  doesn't meed guidelines Recommended for approval:  treatment plan for lumbar spondylosis has not been established   Medical records do not support or document ( The physician advisor concluded that based on the clinical information the requested medial branch blocks to bilateral L4-5, L5-S1, for treatment of lunbar spondylosis are not medically necessary for patient. No indication on provided records that the requested lumbar medial branch blocks are diagnostic in nature. Guidelines require that imaging studies and physical examination have ruled out other causes of spinal pain, ex herniated disc, spinal stenosis, fracture, tumor. This guideline was not met. MRI revealed broad based posterior disc bulge or protrusion at L4-5 and the treatment plan for lumbar spondylosis has not been established, therefore this is deemed not medically necessary.

## 2022-05-23 NOTE — Patient Instructions (Signed)

## 2022-05-25 ENCOUNTER — Ambulatory Visit (INDEPENDENT_AMBULATORY_CARE_PROVIDER_SITE_OTHER): Payer: No Typology Code available for payment source | Admitting: Nurse Practitioner

## 2022-05-25 ENCOUNTER — Encounter: Payer: Self-pay | Admitting: Nurse Practitioner

## 2022-05-25 ENCOUNTER — Telehealth: Payer: Self-pay | Admitting: Nurse Practitioner

## 2022-05-25 VITALS — BP 127/81 | HR 99 | Temp 97.9°F | Ht 62.01 in | Wt 221.4 lb

## 2022-05-25 DIAGNOSIS — M961 Postlaminectomy syndrome, not elsewhere classified: Secondary | ICD-10-CM

## 2022-05-25 DIAGNOSIS — F1120 Opioid dependence, uncomplicated: Secondary | ICD-10-CM | POA: Diagnosis not present

## 2022-05-25 NOTE — Telephone Encounter (Signed)
Medication Refill - Medication: HYDROcodone-acetaminophen (NORCO) 7.5-325 MG tablet   Has the patient contacted their pharmacy? Yes.   (Agent: If no, request that the patient contact the pharmacy for the refill. If patient does not wish to contact the pharmacy document the reason why and proceed with request.) (Agent: If yes, when and what did the pharmacy advise?)  Preferred Pharmacy (with phone number or street name):  [id number] [address], [place] - [address]  [address] [place] [address]  Phone: [phone] Fax: [phone]   Has the patient been seen for an appointment in the last year OR does the patient have an upcoming appointment? Yes.    Agent: Please be advised that RX refills may take up to 3 business days. We ask that you follow-up with your pharmacy.

## 2022-05-25 NOTE — Assessment & Plan Note (Signed)
Refer to chronic opioid plan of care. 

## 2022-05-25 NOTE — Assessment & Plan Note (Signed)
Chronic.  Currently establishing with pain clinic.  At this time will continue to bridge her pain medication -- one month refill today and return in one month until established with [facility] pain clinic fully.  Contract signed in office and UDS up to date.

## 2022-05-25 NOTE — Progress Notes (Signed)
BP 127/81   Pulse 99   Temp 97.9 F (36.6 C) (Oral)   Ht 5' 2.01" (1.575 m)   Wt 221 lb 6.4 oz (100.4 kg)   LMP 02/25/2009 (Approximate)   SpO2 99%   BMI 40.48 kg/m    Subjective:    Patient ID: Holly French, female    DOB: [birth year], 46 y.o.   MRN: [id number]  HPI: Holly French is a 46 y.o. female  Chief Complaint  Patient presents with   Back Pain   Continuous Opioid Dependence   CHRONIC PAIN  Currently between pain clinics -- her pain management provider at [facility] went to part-time and injections only, so they advised her to find a pain management.  Initial visit with [facility] pain management clinic on 03/08/22 and then returned on 05/03/22 -- they did not take over refills at that time, they have recommended facet blocks, but this has not been approved by insurance.   History of lumbar fusion L5-S1 -- 2010, [place] [facility] feels there is possibility this did not fully fuse == had MRI and CT performed.  Last Norco refill was 04/24/22 for 30 day supply.  Saw neurosurgery last 03/12/22, to return if pain gets worse. Present dose:  22.5 Morphine equivalents Pain control status: stable Duration: chronic Location: lower back and down right leg Quality: dull, aching, and throbbing -- numbness across bottom of back Current Pain Level: 8-9/10 due to colder weather Previous Pain Levels: 8-9/10 with weather changes Breakthrough pain: no Benefit from narcotic medications: yes What Activities task can be accomplished with current medication? Can do all her ADLs and work full-time Interested in Careers adviser off narcotics:no   Stool softners/OTC fiber: no  Previous pain specialty evaluation: yes Non-narcotic analgesic meds: yes Narcotic contract: up to date  Relevant past medical, surgical, family and social history reviewed and updated as indicated. Interim medical history since our last visit reviewed. Allergies and medications reviewed and updated.  Review of  Systems  Constitutional:  Negative for activity change, appetite change, diaphoresis, fatigue and fever.  Respiratory:  Negative for cough, chest tightness and shortness of breath.   Cardiovascular:  Negative for chest pain, palpitations and leg swelling.  Gastrointestinal: Negative.   Neurological: Negative.   Psychiatric/Behavioral: Negative.     Per HPI unless specifically indicated above     Objective:    BP 127/81   Pulse 99   Temp 97.9 F (36.6 C) (Oral)   Ht 5' 2.01" (1.575 m)   Wt 221 lb 6.4 oz (100.4 kg)   LMP 02/25/2009 (Approximate)   SpO2 99%   BMI 40.48 kg/m   Wt Readings from Last 3 Encounters:  05/25/22 221 lb 6.4 oz (100.4 kg)  05/03/22 217 lb (98.4 kg)  04/23/22 217 lb 8 oz (98.7 kg)    Physical Exam Vitals and nursing note reviewed.  Constitutional:      General: She is awake. She is not in acute distress.    Appearance: She is well-developed and well-groomed. She is obese. She is not ill-appearing or toxic-appearing.  HENT:     Head: Normocephalic.     Right Ear: Hearing normal.     Left Ear: Hearing normal.  Eyes:     General: Lids are normal.        Right eye: No discharge.        Left eye: No discharge.     Conjunctiva/sclera: Conjunctivae normal.     Pupils: Pupils are equal, round,  and reactive to light.  Neck:     Thyroid: No thyromegaly.     Vascular: No carotid bruit.  Cardiovascular:     Rate and Rhythm: Normal rate and regular rhythm.     Heart sounds: Normal heart sounds. No murmur heard.    No gallop.  Pulmonary:     Effort: Pulmonary effort is normal. No accessory muscle usage or respiratory distress.     Breath sounds: Normal breath sounds.  Abdominal:     General: Bowel sounds are normal.     Palpations: Abdomen is soft.  Musculoskeletal:     Cervical back: Normal range of motion and neck supple.     Right lower leg: No edema.     Left lower leg: No edema.  Skin:    General: Skin is warm and dry.  Neurological:      Mental Status: She is alert and oriented to person, place, and time.  Psychiatric:        Attention and Perception: Attention normal.        Mood and Affect: Mood normal.        Speech: Speech normal.        Behavior: Behavior normal. Behavior is cooperative.        Thought Content: Thought content normal.    Results for orders placed or performed in visit on 03/08/22  Compliance Drug Analysis, Ur  Result Value Ref Range   Summary Note   Comp. Metabolic Panel (12)  Result Value Ref Range   Glucose 86 70 - 99 mg/dL   BUN 7 6 - 24 mg/dL   Creatinine, Ser 0.68 0.57 - 1.00 mg/dL   eGFR 110 >59 mL/min/1.73   BUN/Creatinine Ratio 10 9 - 23   Sodium 139 134 - 144 mmol/L   Potassium 4.4 3.5 - 5.2 mmol/L   Chloride 103 96 - 106 mmol/L   Calcium 9.4 8.7 - 10.2 mg/dL   Total Protein 6.9 6.0 - 8.5 g/dL   Albumin 4.4 3.9 - 4.9 g/dL   Globulin, Total 2.5 1.5 - 4.5 g/dL   Albumin/Globulin Ratio 1.8 1.2 - 2.2   Bilirubin Total 0.3 0.0 - 1.2 mg/dL   Alkaline Phosphatase 83 44 - 121 IU/L   AST 18 0 - 40 IU/L  Magnesium  Result Value Ref Range   Magnesium 2.1 1.6 - 2.3 mg/dL  Vitamin B12  Result Value Ref Range   Vitamin B-12 373 232 - 1,245 pg/mL  Sedimentation rate  Result Value Ref Range   Sed Rate 11 0 - 32 mm/hr  25-Hydroxy vitamin D Lcms D2+D3  Result Value Ref Range   25-Hydroxy, Vitamin D 29 (L) ng/mL   25-Hydroxy, Vitamin D-2 <1.0 ng/mL   25-Hydroxy, Vitamin D-3 29 ng/mL  C-reactive protein  Result Value Ref Range   CRP 5 0 - 10 mg/L      Assessment & Plan:   Problem List Items Addressed This Visit       Other   Postlaminectomy syndrome, lumbar region (Chronic)    Refer to chronic opioid plan of care.      Continuous opioid dependence ([place]) - Primary    Chronic.  Currently establishing with pain clinic.  At this time will continue to bridge her pain medication -- one month refill today and return in one month until established with [facility] pain clinic fully.  Contract  signed in office and UDS up to date.        Follow up plan: Return  in about 4 weeks (around 06/22/2022) for CHRONIC BACK PAIN.

## 2022-05-26 MED ORDER — HYDROCODONE-ACETAMINOPHEN 7.5-325 MG PO TABS: 1.0000 | ORAL_TABLET | Freq: Three times a day (TID) | ORAL | 0 refills | Status: DC

## 2022-05-26 NOTE — Addendum Note (Signed)
Addended by: Aura Dials T on: 05/26/2022 10:07 AM   Modules accepted: Orders

## 2022-05-26 NOTE — Telephone Encounter (Signed)
Does not look like it was prescribed on 05/25/22, still listed as last prescribed by historical provider

## 2022-05-31 ENCOUNTER — Other Ambulatory Visit: Payer: Self-pay | Admitting: Nurse Practitioner

## 2022-06-01 NOTE — Telephone Encounter (Signed)
Requested medication (s) are due for refill today - yes  Requested medication (s) are on the active medication list -yes  Future visit scheduled -yes  Last refill: 03/05/22 90g  Notes to clinic: non delegated Rx  Requested Prescriptions  Pending Prescriptions Disp Refills   triamcinolone ointment (KENALOG) 0.5 % [Pharmacy Med Name: TRIAMCINOLONE 0.5% OINTMENT] 60 g     Sig: APPLY TO THE AFFECTED AREA(S) 2 TIMES A DAY     Not Delegated - Dermatology:  Corticosteroids Failed - 05/31/2022  5:20 PM      Failed - This refill cannot be delegated      Passed - Valid encounter within last 12 months    Recent Outpatient Visits           1 week ago Continuous opioid dependence (HCC)   [facility] Cannady, Jolene T, NP   1 month ago Continuous opioid dependence (HCC)   [facility] Bent, [place] T, NP   2 months ago Continuous opioid dependence (HCC)   [facility] Winnie, [place] T, NP   3 months ago Continuous opioid dependence (HCC)   [facility] Cannady, Jolene T, NP   4 months ago Continuous opioid dependence (HCC)   [facility] [place], Dorie Rank, NP       Future Appointments             In 3 weeks Cannady, Dorie Rank, NP [facility]   In 1 month [place], [place] T, NP [facility], [facility]               Requested Prescriptions  Pending Prescriptions Disp Refills   triamcinolone ointment (KENALOG) 0.5 % [Pharmacy Med Name: TRIAMCINOLONE 0.5% OINTMENT] 60 g     Sig: APPLY TO THE AFFECTED AREA(S) 2 TIMES A DAY     Not Delegated - Dermatology:  Corticosteroids Failed - 05/31/2022  5:20 PM      Failed - This refill cannot be delegated      Passed - Valid encounter within last 12 months    Recent Outpatient Visits           1 week ago Continuous opioid dependence (HCC)   [facility] Cannady, Jolene T, NP   1 month ago Continuous opioid dependence (HCC)   [facility] Ohlman, Vandiver T, NP   2 months ago Continuous opioid dependence (HCC)   [facility] Cannady, [place] T, NP   3 months ago Continuous opioid dependence (HCC)   [facility] Cannady, Jolene T, NP   4 months ago Continuous opioid dependence (HCC)   [facility] [place], Dorie Rank, NP       Future Appointments             In 3 weeks Cannady, Dorie Rank, NP [facility]   In 1 month [place], Dorie Rank, NP [facility], [facility]

## 2022-06-21 NOTE — Patient Instructions (Signed)

## 2022-06-22 ENCOUNTER — Ambulatory Visit: Payer: Commercial Managed Care - PPO | Admitting: Nurse Practitioner

## 2022-06-22 ENCOUNTER — Encounter: Payer: Self-pay | Admitting: Nurse Practitioner

## 2022-06-22 VITALS — BP 130/86 | HR 94 | Temp 97.9°F | Ht 62.01 in | Wt 215.8 lb

## 2022-06-22 DIAGNOSIS — G894 Chronic pain syndrome: Secondary | ICD-10-CM

## 2022-06-22 DIAGNOSIS — E6609 Other obesity due to excess calories: Secondary | ICD-10-CM | POA: Diagnosis not present

## 2022-06-22 DIAGNOSIS — F1120 Opioid dependence, uncomplicated: Secondary | ICD-10-CM | POA: Diagnosis not present

## 2022-06-22 DIAGNOSIS — J0100 Acute maxillary sinusitis, unspecified: Secondary | ICD-10-CM | POA: Diagnosis not present

## 2022-06-22 DIAGNOSIS — Z6839 Body mass index (BMI) 39.0-39.9, adult: Secondary | ICD-10-CM | POA: Diagnosis not present

## 2022-06-22 MED ORDER — AMITRIPTYLINE HCL 50 MG PO TABS: 50.0000 mg | ORAL_TABLET | Freq: Every day | ORAL | 4 refills | Status: DC

## 2022-06-22 MED ORDER — DULOXETINE HCL 30 MG PO CPEP: 30.0000 mg | ORAL_CAPSULE | Freq: Two times a day (BID) | ORAL | 4 refills | Status: DC

## 2022-06-22 MED ORDER — PREDNISONE 20 MG PO TABS: 40.0000 mg | ORAL_TABLET | Freq: Every day | ORAL | 0 refills | Status: AC

## 2022-06-22 MED ORDER — AMOXICILLIN-POT CLAVULANATE 875-125 MG PO TABS: 1.0000 | ORAL_TABLET | Freq: Two times a day (BID) | ORAL | 0 refills | Status: AC

## 2022-06-22 MED ORDER — LOSARTAN POTASSIUM 50 MG PO TABS: 100.0000 mg | ORAL_TABLET | Freq: Every day | ORAL | 4 refills | Status: DC

## 2022-06-22 MED ORDER — TIZANIDINE HCL 4 MG PO CAPS: 4.0000 mg | ORAL_CAPSULE | Freq: Three times a day (TID) | ORAL | 4 refills | Status: DC

## 2022-06-22 NOTE — Progress Notes (Signed)
BP 130/86   Pulse 94 Comment: apical  Temp 97.9 F (36.6 C) (Oral)   Ht 5' 2.01" (1.575 m)   Wt 215 lb 12.8 oz (97.9 kg)   LMP 02/25/2009 (Approximate)   SpO2 97%   BMI 39.46 kg/m    Subjective:    Patient ID: Holly French, female    DOB: [birth year], 47 y.o.   MRN: [id number]  HPI: Holly French is a 47 y.o. female  Chief Complaint  Patient presents with   Cough    With sinus pressure. Started about a month and 1/2 ago   Back Pain    Here for follow up   [facility] went to part-time and injections only, so they advised her to find a pain management.  Following with [facility] pain management clinic and last visit was 05/03/22 - did not take over refills at that time, they have recommended facet blocks, but this has not been approved by insurance.  Spoke to pain provider and they do interventional.    History of lumbar fusion L5-S1 -- 2010, [place] [facility] feels there is possibility this did not fully fuse == had MRI and CT performed.  Last Norco refill was 05/31/22 for 30 day supply.  Saw neurosurgery last 03/12/22, to return if pain gets worse. Present dose:  22.5 Morphine equivalents Pain control status: stable Duration: chronic Location: lower back and down right leg Quality: dull, aching, and throbbing -- numbness across bottom of back Current Pain Level: 6/10 due to colder weather Previous Pain Levels: 8-9/10 with weather changes Breakthrough pain: no Benefit from narcotic medications: yes What Activities task can be accomplished with current medication? Can do all her ADLs and work full-time Interested in Careers adviser off narcotics:no   Stool softners/OTC fiber: no  Previous pain specialty evaluation: yes Non-narcotic analgesic meds: yes Narcotic contract: up to date  UPPER RESPIRATORY TRACT INFECTION Progressively worsening sinus infection over past month.  Has Covid vaccine x 1 and flu vaccine this year. Fever: no Cough: yes Shortness of  breath: no Wheezing: yes Chest pain: no Chest tightness: yes Chest congestion: no Nasal congestion: yes Runny nose: no Post nasal drip: yes Sneezing: no Sore throat:  scratchy Swollen glands: no Sinus pressure: yes Headache: yes Face pain: yes Toothache: yes Ear pain: no none Ear pressure: none Eyes red/itching:no Eye drainage/crusting: no  Vomiting: no Rash: no Fatigue: yes Sick contacts: yes Strep contacts: no  Context: fluctuating Recurrent sinusitis: no Relief with OTC cold/cough medications: none Treatments attempted: Tylenol Cold and Sinus and Nyquil  Relevant past medical, surgical, family and social history reviewed and updated as indicated. Interim medical history since our last visit reviewed. Allergies and medications reviewed and updated.  Review of Systems  Constitutional:  Negative for activity change, appetite change, diaphoresis, fatigue and fever.  Respiratory:  Negative for cough, chest tightness and shortness of breath.   Cardiovascular:  Negative for chest pain, palpitations and leg swelling.  Gastrointestinal: Negative.   Neurological: Negative.   Psychiatric/Behavioral: Negative.     Per HPI unless specifically indicated above     Objective:    BP 130/86   Pulse 94 Comment: apical  Temp 97.9 F (36.6 C) (Oral)   Ht 5' 2.01" (1.575 m)   Wt 215 lb 12.8 oz (97.9 kg)   LMP 02/25/2009 (Approximate)   SpO2 97%   BMI 39.46 kg/m   Wt Readings from Last 3 Encounters:  06/22/22 215 lb 12.8 oz (97.9  kg)  05/25/22 221 lb 6.4 oz (100.4 kg)  05/03/22 217 lb (98.4 kg)    Physical Exam Vitals and nursing note reviewed.  Constitutional:      General: She is awake. She is not in acute distress.    Appearance: She is well-developed and well-groomed. She is obese. She is not ill-appearing or toxic-appearing.  HENT:     Head: Normocephalic.     Right Ear: Hearing, ear canal and external ear normal. A middle ear effusion is present. Tympanic membrane  is not injected.     Left Ear: Hearing, ear canal and external ear normal. A middle ear effusion is present. Tympanic membrane is not injected.     Nose: Rhinorrhea present. Rhinorrhea is clear.     Right Sinus: Maxillary sinus tenderness present. No frontal sinus tenderness.     Left Sinus: Maxillary sinus tenderness present. No frontal sinus tenderness.     Mouth/Throat:     Mouth: Mucous membranes are moist.     Pharynx: Posterior oropharyngeal erythema (mild) present. No pharyngeal swelling or oropharyngeal exudate.  Eyes:     General: Lids are normal.        Right eye: No discharge.        Left eye: No discharge.     Conjunctiva/sclera: Conjunctivae normal.     Pupils: Pupils are equal, round, and reactive to light.  Neck:     Thyroid: No thyromegaly.     Vascular: No carotid bruit.  Cardiovascular:     Rate and Rhythm: Normal rate and regular rhythm.     Heart sounds: Normal heart sounds. No murmur heard.    No gallop.  Pulmonary:     Effort: Pulmonary effort is normal. No accessory muscle usage or respiratory distress.     Breath sounds: Normal breath sounds.  Abdominal:     General: Bowel sounds are normal.     Palpations: Abdomen is soft.  Musculoskeletal:     Cervical back: Normal range of motion and neck supple.     Right lower leg: No edema.     Left lower leg: No edema.  Skin:    General: Skin is warm and dry.  Neurological:     Mental Status: She is alert and oriented to person, place, and time.  Psychiatric:        Attention and Perception: Attention normal.        Mood and Affect: Mood normal.        Speech: Speech normal.        Behavior: Behavior normal. Behavior is cooperative.        Thought Content: Thought content normal.    Results for orders placed or performed in visit on 03/08/22  Compliance Drug Analysis, Ur  Result Value Ref Range   Summary Note   Comp. Metabolic Panel (12)  Result Value Ref Range   Glucose 86 70 - 99 mg/dL   BUN 7 6 - 24  mg/dL   Creatinine, Ser 2.37 0.57 - 1.00 mg/dL   eGFR 628 >31 DV/VOH/6.07   BUN/Creatinine Ratio 10 9 - 23   Sodium 139 134 - 144 mmol/L   Potassium 4.4 3.5 - 5.2 mmol/L   Chloride 103 96 - 106 mmol/L   Calcium 9.4 8.7 - 10.2 mg/dL   Total Protein 6.9 6.0 - 8.5 g/dL   Albumin 4.4 3.9 - 4.9 g/dL   Globulin, Total 2.5 1.5 - 4.5 g/dL   Albumin/Globulin Ratio 1.8 1.2 - 2.2  Bilirubin Total 0.3 0.0 - 1.2 mg/dL   Alkaline Phosphatase 83 44 - 121 IU/L   AST 18 0 - 40 IU/L  Magnesium  Result Value Ref Range   Magnesium 2.1 1.6 - 2.3 mg/dL  Vitamin U31  Result Value Ref Range   Vitamin B-12 373 232 - 1,245 pg/mL  Sedimentation rate  Result Value Ref Range   Sed Rate 11 0 - 32 mm/hr  25-Hydroxy vitamin D Lcms D2+D3  Result Value Ref Range   25-Hydroxy, Vitamin D 29 (L) ng/mL   25-Hydroxy, Vitamin D-2 <1.0 ng/mL   25-Hydroxy, Vitamin D-3 29 ng/mL  C-reactive protein  Result Value Ref Range   CRP 5 0 - 10 mg/L      Assessment & Plan:   Problem List Items Addressed This Visit       Respiratory   Acute non-recurrent maxillary sinusitis    Acute with no improvement over several weeks.  At this time will start Augmentin BID for 7 days and Prednisone 40 MG daily for 5 days.  Recommend: - Increased rest - Increasing Fluids - Acetaminophen as needed for fever/pain.  - Salt water gargling, chloraseptic spray and throat lozenges - Mucinex.  - Humidifying the air.       Relevant Medications   predniSONE (DELTASONE) 20 MG tablet   amoxicillin-clavulanate (AUGMENTIN) 875-125 MG tablet     Other   Chronic pain syndrome (Chronic)    Refer to chronic opioid plan of care.      Relevant Medications   predniSONE (DELTASONE) 20 MG tablet   DULoxetine (CYMBALTA) 30 MG capsule   amitriptyline (ELAVIL) 50 MG tablet   tiZANidine (ZANAFLEX) 4 MG capsule   Other Relevant Orders   Ambulatory referral to Pain Clinic   Continuous opioid dependence (HCC) - Primary    Chronic.  Currently  established with pain clinic, but needs new one who will take over opioid therapy -- new referral placed to alternate location.  At this time will continue to bridge her pain medication -- one month refill today and return in one month until established with new provider.  Contract signed in office and UDS up to date.      Relevant Medications   DULoxetine (CYMBALTA) 30 MG capsule   amitriptyline (ELAVIL) 50 MG tablet   Other Relevant Orders   Ambulatory referral to Pain Clinic   Obesity    BMI 39.46.  Recommended eating smaller high protein, low fat meals more frequently and exercising 30 mins a day 5 times a week with a goal of 10-15lb weight loss in the next 3 months. Patient voiced their understanding and motivation to adhere to these recommendations.         Follow up plan: Return for as scheduled 07/19/22.

## 2022-06-22 NOTE — Assessment & Plan Note (Signed)
Acute with no improvement over several weeks.  At this time will start Augmentin BID for 7 days and Prednisone 40 MG daily for 5 days.  Recommend: - Increased rest - Increasing Fluids - Acetaminophen as needed for fever/pain.  - Salt water gargling, chloraseptic spray and throat lozenges - Mucinex.  - Humidifying the air.

## 2022-06-22 NOTE — Assessment & Plan Note (Signed)
Chronic.  Currently established with pain clinic, but needs new one who will take over opioid therapy -- new referral placed to alternate location.  At this time will continue to bridge her pain medication -- one month refill today and return in one month until established with new provider.  Contract signed in office and UDS up to date.

## 2022-06-22 NOTE — Assessment & Plan Note (Signed)
Refer to chronic opioid plan of care. 

## 2022-06-22 NOTE — Assessment & Plan Note (Signed)
BMI 39.46.  Recommended eating smaller high protein, low fat meals more frequently and exercising 30 mins a day 5 times a week with a goal of 10-15lb weight loss in the next 3 months. Patient voiced their understanding and motivation to adhere to these recommendations.

## 2022-06-24 ENCOUNTER — Other Ambulatory Visit: Payer: Self-pay | Admitting: Nurse Practitioner

## 2022-06-24 MED ORDER — HYDROCODONE-ACETAMINOPHEN 7.5-325 MG PO TABS: 1.0000 | ORAL_TABLET | Freq: Three times a day (TID) | ORAL | 0 refills | Status: DC

## 2022-06-24 NOTE — Addendum Note (Signed)
Addended by: Marnee Guarneri T on: 06/24/2022 04:40 PM   Modules accepted: Orders

## 2022-06-24 NOTE — Telephone Encounter (Signed)
Requested medication (s) are due for refill today: yes  Requested medication (s) are on the active medication list: yes  Last refill:  05/26/22 end date 06/25/22  Future visit scheduled: yes  Notes to clinic:  med not delegated to NT to RF   Requested Prescriptions  Pending Prescriptions Disp Refills   HYDROcodone-acetaminophen (NORCO) 7.5-325 MG tablet 90 tablet 0    Sig: Take 1 tablet by mouth in the morning, at noon, and at bedtime.     Not Delegated - Analgesics:  Opioid Agonist Combinations Failed - 06/24/2022 10:41 AM      Failed - This refill cannot be delegated      Passed - Urine Drug Screen completed in last 360 days      Passed - Valid encounter within last 3 months    Recent Outpatient Visits           2 days ago Continuous opioid dependence ([place])   [place], Jolene T, NP   1 month ago Continuous opioid dependence ([place])   [place], [place] T, NP   2 months ago Continuous opioid dependence ([place])   [place], [place] T, NP   3 months ago Continuous opioid dependence ([place])   [place], Jolene T, NP   4 months ago Continuous opioid dependence ([place])   [place], Barbaraann Faster, NP       Future Appointments             In 1 month Cannady, Barbaraann Faster, NP [facility], [facility]

## 2022-06-24 NOTE — Telephone Encounter (Signed)
Medication Refill - Medication: HYDROcodone-acetaminophen (NORCO) 7.5-325 MG tablet. Patient states she is due tomorrow for a refill  Has the patient contacted their pharmacy? Yes.    (Agent: If yes, when and what did the pharmacy advise Contact PCP   Preferred Pharmacy (with phone number or street name):   [facility] [id number], [place] Phone: [phone]  Fax: [phone]        Has the patient been seen for an appointment in the last year OR does the patient have an upcoming appointment? Yes.    Agent: Please be advised that RX refills may take up to 3 business days. We ask that you follow-up with your pharmacy.

## 2022-06-29 ENCOUNTER — Other Ambulatory Visit: Payer: Self-pay | Admitting: Nurse Practitioner

## 2022-06-29 DIAGNOSIS — Z1231 Encounter for screening mammogram for malignant neoplasm of breast: Secondary | ICD-10-CM

## 2022-07-09 ENCOUNTER — Ambulatory Visit
Admission: RE | Admit: 2022-07-09 | Discharge: 2022-07-09 | Disposition: A | Discharge status: Home / Self Care | Payer: Commercial Managed Care - PPO | Source: Ambulatory Visit | Attending: Nurse Practitioner | Admitting: Nurse Practitioner

## 2022-07-09 DIAGNOSIS — Z1231 Encounter for screening mammogram for malignant neoplasm of breast: Secondary | ICD-10-CM

## 2022-07-13 NOTE — Progress Notes (Signed)
Contacted via MyChart   Normal mammogram, may repeat in one year:)

## 2022-07-22 ENCOUNTER — Other Ambulatory Visit: Payer: Self-pay | Admitting: Nurse Practitioner

## 2022-07-22 MED ORDER — HYDROCODONE-ACETAMINOPHEN 7.5-325 MG PO TABS: 1.0000 | ORAL_TABLET | Freq: Three times a day (TID) | ORAL | 0 refills | Status: DC

## 2022-07-23 NOTE — Telephone Encounter (Signed)
Requested medications are due for refill today.  Provider to determine  Requested medications are on the active medications list.  yes  Last refill. 05/03/2022 #30 2 rf  Future visit scheduled.   yes  Notes to clinic.  Refill not delegated.    Requested Prescriptions  Pending Prescriptions Disp Refills   Eszopiclone 3 MG TABS [Pharmacy Med Name: ESZOPICLONE 3 MG TABLET] 27 tablet     Sig: TAKE ONE TABLET BY MOUTH AT BEDTIME AS NEEDED     Not Delegated - Psychiatry:  Anxiolytics/Hypnotics Failed - 07/22/2022  4:18 PM      Failed - This refill cannot be delegated      Passed - Urine Drug Screen completed in last 360 days      Passed - Valid encounter within last 6 months    Recent Outpatient Visits           1 month ago Continuous opioid dependence ([place])   [place], [place] T, NP   1 month ago Continuous opioid dependence ([place])   [place], [place] T, NP   3 months ago Continuous opioid dependence ([place])   [place], Henrine Screws T, NP   4 months ago Continuous opioid dependence ([place])   [place], Henrine Screws T, NP   5 months ago Continuous opioid dependence [facility])   [place], Barbaraann Faster, NP       Future Appointments             In 3 days Cannady, Barbaraann Faster, NP [place], [facility]

## 2022-07-25 DIAGNOSIS — E7800 Pure hypercholesterolemia, unspecified: Secondary | ICD-10-CM | POA: Insufficient documentation

## 2022-07-25 NOTE — Patient Instructions (Signed)

## 2022-07-26 ENCOUNTER — Ambulatory Visit (INDEPENDENT_AMBULATORY_CARE_PROVIDER_SITE_OTHER): Payer: Commercial Managed Care - PPO | Admitting: Nurse Practitioner

## 2022-07-26 ENCOUNTER — Encounter: Payer: Self-pay | Admitting: Nurse Practitioner

## 2022-07-26 VITALS — BP 128/83 | HR 96 | Temp 98.3°F | Ht 62.01 in | Wt 217.3 lb

## 2022-07-26 DIAGNOSIS — I10 Essential (primary) hypertension: Secondary | ICD-10-CM

## 2022-07-26 DIAGNOSIS — G894 Chronic pain syndrome: Secondary | ICD-10-CM

## 2022-07-26 DIAGNOSIS — F419 Anxiety disorder, unspecified: Secondary | ICD-10-CM

## 2022-07-26 DIAGNOSIS — J4520 Mild intermittent asthma, uncomplicated: Secondary | ICD-10-CM | POA: Diagnosis not present

## 2022-07-26 DIAGNOSIS — Z0000 Encounter for general adult medical examination without abnormal findings: Secondary | ICD-10-CM

## 2022-07-26 DIAGNOSIS — F5104 Psychophysiologic insomnia: Secondary | ICD-10-CM

## 2022-07-26 DIAGNOSIS — E7800 Pure hypercholesterolemia, unspecified: Secondary | ICD-10-CM

## 2022-07-26 DIAGNOSIS — F17210 Nicotine dependence, cigarettes, uncomplicated: Secondary | ICD-10-CM | POA: Diagnosis not present

## 2022-07-26 DIAGNOSIS — E6609 Other obesity due to excess calories: Secondary | ICD-10-CM

## 2022-07-26 DIAGNOSIS — E559 Vitamin D deficiency, unspecified: Secondary | ICD-10-CM | POA: Diagnosis not present

## 2022-07-26 DIAGNOSIS — F1120 Opioid dependence, uncomplicated: Secondary | ICD-10-CM | POA: Diagnosis not present

## 2022-07-26 DIAGNOSIS — Z6839 Body mass index (BMI) 39.0-39.9, adult: Secondary | ICD-10-CM

## 2022-07-26 NOTE — Assessment & Plan Note (Signed)
Chronic, stable with minimal Albuterol use.  Recommend complete cessation of smoking.  Notify provider if increased Albuterol use.  CBC today.

## 2022-07-26 NOTE — Assessment & Plan Note (Signed)
Chronic, stable.  BP at goal in office today.  Recommend she monitor BP at least a few mornings a week at home and document.  DASH diet at home.  Continue current medication regimen and adjust as needed.  LABS: CBC, CMP, TSH, urine ALB.  Refills up to date.

## 2022-07-26 NOTE — Assessment & Plan Note (Signed)
Refer to chronic opioid plan of care.

## 2022-07-26 NOTE — Assessment & Plan Note (Signed)
Noted past labs.  ASCVD 4.4%, LDL <190.  At this time continue diet and exercise focus.  Recheck lipid panel today.

## 2022-07-26 NOTE — Assessment & Plan Note (Signed)
BMI 39.73.  Recommended eating smaller high protein, low fat meals more frequently and exercising 30 mins a day 5 times a week with a goal of 10-15lb weight loss in the next 3 months. Patient voiced their understanding and motivation to adhere to these recommendations.

## 2022-07-26 NOTE — Assessment & Plan Note (Signed)
Chronic, ongoing.  Continue Lunesta as needed only and Amitriptyline at HS for sleep.

## 2022-07-26 NOTE — Assessment & Plan Note (Signed)
Chronic, ongoing.  Denies SI/HI.  Continue Duloxetine, which is beneficial to both pain and mood.  Refills up to date.

## 2022-07-26 NOTE — Assessment & Plan Note (Signed)
I have recommended complete cessation of tobacco use. I have discussed various options available for assistance with tobacco cessation including over the counter methods (Nicotine gum, patch and lozenges). We also discussed prescription options (Chantix, Nicotine Inhaler / Nasal Spray). The patient is not interested in pursuing any prescription tobacco cessation options at this time.  

## 2022-07-26 NOTE — Assessment & Plan Note (Signed)
Chronic.  Currently scheduled to see new pain clinic this week, she will alert provider if they are willing to take over pain management.  At this time will continue to bridge her pain medication -- one month refill up to date and return in one month until established with new provider.  Contract signed in office and UDS up to date.

## 2022-07-26 NOTE — Assessment & Plan Note (Signed)
Chronic, ongoing.  Check today and continue supplement as needed.

## 2022-07-26 NOTE — Progress Notes (Signed)
BP 128/83   Pulse 96   Temp 98.3 F (36.8 C) (Oral)   Ht 5' 2.01" (1.575 m)   Wt 217 lb 4.8 oz (98.6 kg)   LMP 02/25/2009 (Approximate)   SpO2 98%   BMI 39.73 kg/m    Subjective:    Patient ID: Holly French, female    DOB: 1975-12-06, 47 y.o.   MRN: [id number]  HPI: Holly French is a 47 y.o. female presenting on 07/26/2022 for comprehensive medical examination. Current medical complaints include:none  She currently lives with: husband Menopausal Symptoms: no  HYPERTENSION Continues on Losartan 100 MG.  Continues to smoke about 1/2 PPD, started smoking off and on since age 89.   Has asthma and minimally uses Albuterol -- only with weather changes. Hypertension status: stable  Satisfied with current treatment? yes Duration of hypertension: chronic BP monitoring frequency: none BP range:  BP medication side effects:  no Medication compliance: good compliance Aspirin: no Recurrent headaches: no Visual changes: no Palpitations: no Dyspnea: no Chest pain: no Lower extremity edema: no Dizzy/lightheaded: no  The 10-year ASCVD risk score (Arnett DK, et al., 2019) is: 4.4%   Values used to calculate the score:     Age: 32 years     Sex: Female     Is Non-Hispanic African American: No     Diabetic: No     Tobacco smoker: Yes     Systolic Blood Pressure: 0000000 mmHg     Is BP treated: Yes     HDL Cholesterol: 55 mg/dL     Total Cholesterol: 226 mg/dL  CHRONIC PAIN  [facility] went to part-time and injections only, so they advised her to find a pain management.  Following with [facility] pain management clinic and last visit was 05/03/22 - did not take over refills at that time, they have recommended facet blocks.  They will not be taking over refills, so she is scheduled to see [facility] on Wednesday to assess her pain and possibly take over pain management.   History of lumbar fusion L5-S1 -- 2010.  Last Norco refill was 07/23/22 for 30 day supply.   Saw  neurosurgery last 03/12/22, to return if pain gets worse. Present dose:  22.5 Morphine equivalents Pain control status: stable Duration: chronic Location: lower back and down right leg Quality: dull, aching, and throbbing -- numbness across bottom of back Current Pain Level:  6/10  Previous Pain Levels: 8-9/10  Breakthrough pain: no Benefit from narcotic medications: yes What Activities task can be accomplished with current medication? Can do all her ADLs and work full-time Interested in Careers adviser off narcotics:no   Stool softners/OTC fiber: no  Previous pain specialty evaluation: yes Non-narcotic analgesic meds: yes Narcotic contract: up to date  [place] on Duloxetine.  Takes Lunesta only as needed + Amitriptyline. Mood status: stable Satisfied with current treatment?: yes Symptom severity: moderate  Duration of current treatment : chronic Side effects: no Medication compliance: good compliance Psychotherapy/counseling: none Depressed mood: no Anxious mood: yes Anhedonia: no Significant weight loss or gain: no Insomnia: yes hard to fall asleep -- if has to go to work next day takes [place] Fatigue: no Feelings of worthlessness or guilt: no Impaired concentration/indecisiveness: no Suicidal ideations: no Hopelessness: no Crying spells: no    07/26/2022    3:40 PM 05/25/2022    3:52 PM 05/03/2022    2:16 PM 04/23/2022    3:16 PM 03/22/2022    3:51 PM  Depression  screen PHQ 2/9  Decreased Interest 0 1 0 1 0  Down, Depressed, Hopeless 0 0 0 0 0  PHQ - 2 Score 0 1 0 1 0  Altered sleeping 1 1  1 1  $ Tired, decreased energy 2 1  1 1  $ Change in appetite 0 0  0 0  Feeling bad or failure about yourself  1 0  0 0  Trouble concentrating 0 1  0 0  Moving slowly or fidgety/restless 0 0  0 0  Suicidal thoughts 0 0  0 0  PHQ-9 Score 4 4  3 2  $ Difficult doing work/chores  Somewhat difficult  Not difficult at all Not difficult at all       07/26/2022    3:40 PM  05/25/2022    3:52 PM 04/23/2022    3:17 PM 03/22/2022    3:51 PM  GAD 7 : Generalized Anxiety Score  Nervous, Anxious, on Edge 2 1 0 1  Control/stop worrying 1 0 0 0  Worry too much - different things 0 0 0 0  Trouble relaxing 2 1 2 1  $ Restless 0 0 0 0  Easily annoyed or irritable 1 0 1 1  Afraid - awful might happen 0 0 0 0  Total GAD 7 Score 6 2 3 3  $ Anxiety Difficulty Not difficult at all Not difficult at all Not difficult at all Not difficult at all      03/08/2022    9:12 AM 04/23/2022    3:16 PM 05/03/2022    2:16 PM 05/25/2022    3:52 PM 07/26/2022    3:40 PM  [place] in the past year? 0 0 0 0 0  Was there an injury with Fall?  0 0 0 0  Fall Risk Category Calculator  0 0 0 0  Fall Risk Category (Retired)  Low Low Low   (RETIRED) Patient Fall Risk Level   Low fall risk    Patient at Risk for Falls Due to  No Fall Risks  No Fall Risks No Fall Risks  Fall risk Follow up  Falls evaluation completed  Falls evaluation completed Falls evaluation completed    Functional Status Survey: Is the patient deaf or have difficulty hearing?: No Does the patient have difficulty seeing, even when wearing glasses/contacts?: No Does the patient have difficulty concentrating, remembering, or making decisions?: No Does the patient have difficulty walking or climbing stairs?: No Does the patient have difficulty dressing or bathing?: No Does the patient have difficulty doing errands alone such as visiting a doctor's office or shopping?: No    Past Medical History:  Past Medical History:  Diagnosis Date   Allergy    Asthma    Chronic urticaria    Dermatitis    Eczema    Hayfever    Hypertension    Vitamin D deficiency     Surgical History:  Past Surgical History:  Procedure Laterality Date   ABDOMINAL HYSTERECTOMY     EPF and gastroc recession Right 01/14/2016   Per MD notes   laparoscopies     x3   NASAL SINUS SURGERY     x3   PLANTAR FASCIA RELEASE     SPINAL  FUSION     lumbar    Medications:  Current Outpatient Medications on File Prior to Visit  Medication Sig   albuterol (VENTOLIN HFA) 108 (90 Base) MCG/ACT inhaler INHALE 2 PUFFS INTO THE LUNGS EVERY 6 HOURS AS NEEDED FOR WHEEZING  OR SHORTNESS OF BREATH   amitriptyline (ELAVIL) 50 MG tablet Take 1 tablet (50 mg total) by mouth at bedtime.   DULoxetine (CYMBALTA) 30 MG capsule Take 1 capsule (30 mg total) by mouth 2 (two) times daily.   Eszopiclone 3 MG TABS TAKE ONE TABLET BY MOUTH AT BEDTIME AS NEEDED   fexofenadine (ALLEGRA ALLERGY) 180 MG tablet Take 1 tablet (180 mg total) by mouth daily.   HYDROcodone-acetaminophen (NORCO) 7.5-325 MG tablet Take 1 tablet by mouth in the morning, at noon, and at bedtime.   losartan (COZAAR) 50 MG tablet Take 2 tablets (100 mg total) by mouth daily.   Melatonin 10 MG CAPS Take by mouth at bedtime as needed.    tiZANidine (ZANAFLEX) 4 MG capsule Take 1 capsule (4 mg total) by mouth 3 (three) times daily.   triamcinolone ointment (KENALOG) 0.5 % APPLY TO THE AFFECTED AREA(S) 2 TIMES A DAY   Cholecalciferol 1.25 MG (50000 UT) TABS Take 1 tablet by mouth once a week. (Patient not taking: Reported on 06/22/2022)   Current Facility-Administered Medications on File Prior to Visit  Medication   triamcinolone acetonide (KENALOG-40) injection 40 mg    Allergies:  Allergies  Allergen Reactions   Covid-19 (Mrna) Vaccine Therapist, music) [Covid-19 (Mrna) Vaccine]    Neosporin [Neomycin-Bacitracin Zn-Polymyx] Dermatitis   Latex Rash    Social History:  Social History   Socioeconomic History   Marital status: Married    Spouse name: Not on file   Number of children: Not on file   Years of education: Not on file   Highest education level: Not on file  Occupational History   Not on file  Tobacco Use   Smoking status: Every Day    Packs/day: 0.50    Years: 18.00    Total pack years: 9.00    Types: Cigarettes   Smokeless tobacco: Never  Vaping Use   Vaping  Use: Never used  Substance and Sexual Activity   Alcohol use: No    Alcohol/week: 0.0 standard drinks of alcohol   Drug use: No   Sexual activity: Yes  Other Topics Concern   Not on file  Social History Narrative   Not on file   Social Determinants of Health   Financial Resource Strain: Low Risk  (07/13/2021)   Overall Financial Resource Strain (CARDIA)    Difficulty of Paying Living Expenses: Not hard at all  Food Insecurity: No Food Insecurity (07/13/2021)   Hunger Vital Sign    Worried About Running Out of Food in the Last Year: Never true    Ran Out of Food in the Last Year: Never true  Transportation Needs: No Transportation Needs (07/13/2021)   PRAPARE - Hydrologist (Medical): No    Lack of Transportation (Non-Medical): No  Physical Activity: Inactive (07/13/2021)   Exercise Vital Sign    Days of Exercise per Week: 0 days    Minutes of Exercise per Session: 0 min  Stress: No Stress Concern Present (07/13/2021)   [place]    Feeling of Stress : Only a little  Social Connections: Moderately Isolated (07/13/2021)   Social Connection and Isolation Panel [NHANES]    Frequency of Communication with Friends and Family: More than three times a week    Frequency of Social Gatherings with Friends and Family: More than three times a week    Attends Religious Services: Never    Marine scientist or  Organizations: No    Attends Archivist Meetings: Never    Marital Status: Married  Human resources officer Violence: Not At Risk (07/13/2021)   Humiliation, Afraid, Rape, and Kick questionnaire    Fear of Current or Ex-Partner: No    Emotionally Abused: No    Physically Abused: No    Sexually Abused: No   Social History   Tobacco Use  Smoking Status Every Day   Packs/day: 0.50   Years: 18.00   Total pack years: 9.00   Types: Cigarettes  Smokeless Tobacco Never   Social  History   Substance and Sexual Activity  Alcohol Use No   Alcohol/week: 0.0 standard drinks of alcohol    Family History:  Family History  Problem Relation Age of Onset   Diabetes Mother    Hyperlipidemia Father    Allergies Daughter    COPD Maternal Grandmother    Lupus Maternal Grandmother    Cancer Maternal Grandfather        colon   Breast cancer Neg Hx     Past medical history, surgical history, medications, allergies, family history and social history reviewed with patient today and changes made to appropriate areas of the chart.   ROS All other ROS negative except what is listed above and in the HPI.      Objective:    BP 128/83   Pulse 96   Temp 98.3 F (36.8 C) (Oral)   Ht 5' 2.01" (1.575 m)   Wt 217 lb 4.8 oz (98.6 kg)   LMP 02/25/2009 (Approximate)   SpO2 98%   BMI 39.73 kg/m   Wt Readings from Last 3 Encounters:  07/26/22 217 lb 4.8 oz (98.6 kg)  06/22/22 215 lb 12.8 oz (97.9 kg)  05/25/22 221 lb 6.4 oz (100.4 kg)    Physical Exam Vitals and nursing note reviewed. Exam conducted with a chaperone present.  Constitutional:      General: She is awake. She is not in acute distress.    Appearance: She is well-developed. She is not ill-appearing.  HENT:     Head: Normocephalic and atraumatic.     Right Ear: Hearing, tympanic membrane, ear canal and external ear normal. No drainage.     Left Ear: Hearing, tympanic membrane, ear canal and external ear normal. No drainage.     Nose: Nose normal.     Right Sinus: No maxillary sinus tenderness or frontal sinus tenderness.     Left Sinus: No maxillary sinus tenderness or frontal sinus tenderness.     Mouth/Throat:     Mouth: Mucous membranes are moist.     Pharynx: Oropharynx is clear. Uvula midline. No pharyngeal swelling, oropharyngeal exudate or posterior oropharyngeal erythema.  Eyes:     General: Lids are normal.        Right eye: No discharge.        Left eye: No discharge.     Extraocular  Movements: Extraocular movements intact.     Conjunctiva/sclera: Conjunctivae normal.     Pupils: Pupils are equal, round, and reactive to light.     Visual Fields: Right eye visual fields normal and left eye visual fields normal.  Neck:     Thyroid: No thyromegaly.     Vascular: No carotid bruit.     Trachea: Trachea normal.  Cardiovascular:     Rate and Rhythm: Normal rate and regular rhythm.     Heart sounds: Normal heart sounds. No murmur heard.    No gallop.  Pulmonary:  Effort: Pulmonary effort is normal. No accessory muscle usage or respiratory distress.     Breath sounds: Normal breath sounds.  Chest:  Breasts:    Right: Normal.     Left: Normal.  Abdominal:     General: Bowel sounds are normal.     Palpations: Abdomen is soft. There is no hepatomegaly or splenomegaly.     Tenderness: There is no abdominal tenderness.  Musculoskeletal:        General: Normal range of motion.     Cervical back: Normal range of motion and neck supple.     Right lower leg: No edema.     Left lower leg: No edema.  Lymphadenopathy:     Head:     Right side of head: No submental, submandibular, tonsillar, preauricular or posterior auricular adenopathy.     Left side of head: No submental, submandibular, tonsillar, preauricular or posterior auricular adenopathy.     Cervical: No cervical adenopathy.     Upper Body:     Right upper body: No supraclavicular, axillary or pectoral adenopathy.     Left upper body: No supraclavicular, axillary or pectoral adenopathy.  Skin:    General: Skin is warm and dry.     Capillary Refill: Capillary refill takes less than 2 seconds.     Findings: No rash.  Neurological:     Mental Status: She is alert and oriented to person, place, and time.     Gait: Gait is intact.     Deep Tendon Reflexes: Reflexes are normal and symmetric.     Reflex Scores:      Brachioradialis reflexes are 2+ on the right side and 2+ on the left side.      Patellar reflexes are  2+ on the right side and 2+ on the left side. Psychiatric:        Attention and Perception: Attention normal.        Mood and Affect: Mood normal.        Speech: Speech normal.        Behavior: Behavior normal. Behavior is cooperative.        Thought Content: Thought content normal.        Judgment: Judgment normal.    Results for orders placed or performed in visit on 03/08/22  Compliance Drug Analysis, Ur  Result Value Ref Range   Summary Note   Comp. Metabolic Panel (12)  Result Value Ref Range   Glucose 86 70 - 99 mg/dL   BUN 7 6 - 24 mg/dL   Creatinine, Ser 0.68 0.57 - 1.00 mg/dL   eGFR 110 >59 mL/min/1.73   BUN/Creatinine Ratio 10 9 - 23   Sodium 139 134 - 144 mmol/L   Potassium 4.4 3.5 - 5.2 mmol/L   Chloride 103 96 - 106 mmol/L   Calcium 9.4 8.7 - 10.2 mg/dL   Total Protein 6.9 6.0 - 8.5 g/dL   Albumin 4.4 3.9 - 4.9 g/dL   Globulin, Total 2.5 1.5 - 4.5 g/dL   Albumin/Globulin Ratio 1.8 1.2 - 2.2   Bilirubin Total 0.3 0.0 - 1.2 mg/dL   Alkaline Phosphatase 83 44 - 121 IU/L   AST 18 0 - 40 IU/L  Magnesium  Result Value Ref Range   Magnesium 2.1 1.6 - 2.3 mg/dL  Vitamin B12  Result Value Ref Range   Vitamin B-12 373 232 - 1,245 pg/mL  Sedimentation rate  Result Value Ref Range   Sed Rate 11 0 - 32 mm/hr  25-Hydroxy vitamin D Lcms D2+D3  Result Value Ref Range   25-Hydroxy, Vitamin D 29 (L) ng/mL   25-Hydroxy, Vitamin D-2 <1.0 ng/mL   25-Hydroxy, Vitamin D-3 29 ng/mL  C-reactive protein  Result Value Ref Range   CRP 5 0 - 10 mg/L      Assessment & Plan:   Problem List Items Addressed This Visit       Cardiovascular and Mediastinum   Hypertension    Chronic, stable.  BP at goal in office today.  Recommend she monitor BP at least a few mornings a week at home and document.  DASH diet at home.  Continue current medication regimen and adjust as needed.  LABS: CBC, CMP, TSH, urine ALB.  Refills up to date.        Relevant Orders   CBC with  Differential/Platelet   Comprehensive metabolic panel   TSH   Urine Microalbumin w/creat. ratio     Respiratory   Asthma    Chronic, stable with minimal Albuterol use.  Recommend complete cessation of smoking.  Notify provider if increased Albuterol use.  CBC today.        Other   Chronic pain syndrome (Chronic)    Refer to chronic opioid plan of care.      Anxiety    Chronic, ongoing.  Denies SI/HI.  Continue Duloxetine, which is beneficial to both pain and mood.  Refills up to date.      Continuous opioid dependence ([place]) - Primary    Chronic.  Currently scheduled to see new pain clinic this week, she will alert provider if they are willing to take over pain management.  At this time will continue to bridge her pain medication -- one month refill up to date and return in one month until established with new provider.  Contract signed in office and UDS up to date.      Elevated low density lipoprotein (LDL) cholesterol level    Noted past labs.  ASCVD 4.4%, LDL <190.  At this time continue diet and exercise focus.  Recheck lipid panel today.      Relevant Orders   Comprehensive metabolic panel   Lipid Panel w/o Chol/HDL Ratio   Insomnia    Chronic, ongoing.  Continue Lunesta as needed only and Amitriptyline at HS for sleep.      Nicotine dependence, cigarettes, uncomplicated    I have recommended complete cessation of tobacco use. I have discussed various options available for assistance with tobacco cessation including over the counter methods (Nicotine gum, patch and lozenges). We also discussed prescription options (Chantix, Nicotine Inhaler / Nasal Spray). The patient is not interested in pursuing any prescription tobacco cessation options at this time.       Obesity    BMI 39.73.  Recommended eating smaller high protein, low fat meals more frequently and exercising 30 mins a day 5 times a week with a goal of 10-15lb weight loss in the next 3 months. Patient voiced their  understanding and motivation to adhere to these recommendations.       Vitamin D deficiency    Chronic, ongoing.  Check today and continue supplement as needed.      Relevant Orders   VITAMIN D 25 Hydroxy (Vit-D Deficiency, Fractures)   Other Visit Diagnoses     Encounter for annual physical exam       Annual physical today with labs and health maintenance reviewed, discussed with patient.        Follow  up plan: Return in about 4 weeks (around 08/23/2022) for CHRONIC PAIN.   LABORATORY TESTING:  - Pap smear: up to date  IMMUNIZATIONS:   - Tdap: Tetanus vaccination status reviewed: last tetanus booster within 10 years. - Influenza: Up to date - Pneumovax: Not applicable - Prevnar: Not applicable - COVID: Up to date x 2 -- allergic to them - HPV: Not applicable - Shingrix vaccine: Not applicable  SCREENING: -Mammogram: Up To Date 07/09/22 - Colonoscopy: Not applicable  - Bone Density: Not applicable  -Hearing Test: Not applicable  -Spirometry: Not applicable   PATIENT COUNSELING:   Advised to take 1 mg of folate supplement per day if capable of pregnancy.   Sexuality: Discussed sexually transmitted diseases, partner selection, use of condoms, avoidance of unintended pregnancy  and contraceptive alternatives.   Advised to avoid cigarette smoking.  I discussed with the patient that most people either abstain from alcohol or drink within safe limits (<=14/week and <=4 drinks/occasion for males, <=7/weeks and <= 3 drinks/occasion for females) and that the risk for alcohol disorders and other health effects rises proportionally with the number of drinks per week and how often a drinker exceeds daily limits.  Discussed cessation/primary prevention of drug use and availability of treatment for abuse.   Diet: Encouraged to adjust caloric intake to maintain  or achieve ideal body weight, to reduce intake of dietary saturated fat and total fat, to limit sodium intake by  avoiding high sodium foods and not adding table salt, and to maintain adequate dietary potassium and calcium preferably from fresh fruits, vegetables, and low-fat dairy products.    Stressed the importance of regular exercise  Injury prevention: Discussed safety belts, safety helmets, smoke detector, smoking near bedding or upholstery.   Dental health: Discussed importance of regular tooth brushing, flossing, and dental visits.    NEXT PREVENTATIVE PHYSICAL DUE IN 1 YEAR. Return in about 4 weeks (around 08/23/2022) for CHRONIC PAIN.

## 2022-07-27 LAB — CBC WITH DIFFERENTIAL/PLATELET
Basophils Absolute: 0.1 10*3/uL (ref 0.0–0.2)
Basos: 1 %
EOS (ABSOLUTE): 0.2 10*3/uL (ref 0.0–0.4)
Eos: 3 %
Hematocrit: 39.1 % (ref 34.0–46.6)
Hemoglobin: 13.2 g/dL (ref 11.1–15.9)
Immature Grans (Abs): 0 10*3/uL (ref 0.0–0.1)
Immature Granulocytes: 0 %
Lymphocytes Absolute: 2.1 10*3/uL (ref 0.7–3.1)
Lymphs: 30 %
MCH: 32.3 pg (ref 26.6–33.0)
MCHC: 33.8 g/dL (ref 31.5–35.7)
MCV: 96 fL (ref 79–97)
Monocytes Absolute: 0.5 10*3/uL (ref 0.1–0.9)
Monocytes: 7 %
Neutrophils Absolute: 4.1 10*3/uL (ref 1.4–7.0)
Neutrophils: 59 %
Platelets: 339 10*3/uL (ref 150–450)
RBC: 4.09 x10E6/uL (ref 3.77–5.28)
RDW: 12.6 % (ref 11.7–15.4)
WBC: 7 10*3/uL (ref 3.4–10.8)

## 2022-07-27 LAB — COMPREHENSIVE METABOLIC PANEL
ALT: 17 IU/L (ref 0–32)
AST: 16 IU/L (ref 0–40)
Albumin/Globulin Ratio: 2.3 — ABNORMAL HIGH (ref 1.2–2.2)
Albumin: 4.5 g/dL (ref 3.9–4.9)
Alkaline Phosphatase: 87 IU/L (ref 44–121)
BUN/Creatinine Ratio: 14 (ref 9–23)
BUN: 9 mg/dL (ref 6–24)
Bilirubin Total: <0.2 mg/dL (ref 0.0–1.2)
CO2: 21 mmol/L (ref 20–29)
Calcium: 9.3 mg/dL (ref 8.7–10.2)
Chloride: 100 mmol/L (ref 96–106)
Creatinine, Ser: 0.64 mg/dL (ref 0.57–1.00)
Globulin, Total: 2 g/dL (ref 1.5–4.5)
Glucose: 101 mg/dL — ABNORMAL HIGH (ref 70–99)
Potassium: 4 mmol/L (ref 3.5–5.2)
Sodium: 138 mmol/L (ref 134–144)
Total Protein: 6.5 g/dL (ref 6.0–8.5)
eGFR: 112 mL/min/{1.73_m2} (ref >59)

## 2022-07-27 LAB — LIPID PANEL W/O CHOL/HDL RATIO
Cholesterol, Total: 209 mg/dL — ABNORMAL HIGH (ref 100–199)
HDL: 47 mg/dL (ref >39)
LDL Chol Calc (NIH): 127 mg/dL — ABNORMAL HIGH (ref 0–99)
Triglycerides: 199 mg/dL — ABNORMAL HIGH (ref 0–149)
VLDL Cholesterol Cal: 35 mg/dL (ref 5–40)

## 2022-07-27 LAB — MICROALBUMIN / CREATININE URINE RATIO
Creatinine, Urine: 16.5 mg/dL
Microalb/Creat Ratio: <18 mg/g creat (ref 0–29)
Microalbumin, Urine: <3 ug/mL

## 2022-07-27 LAB — TSH: TSH: 1.9 u[IU]/mL (ref 0.450–4.500)

## 2022-07-27 LAB — VITAMIN D 25 HYDROXY (VIT D DEFICIENCY, FRACTURES): Vit D, 25-Hydroxy: 22.9 ng/mL — ABNORMAL LOW (ref 30.0–100.0)

## 2022-07-27 NOTE — Progress Notes (Signed)
Contacted via [place] morning Holly French, your labs have returned: - Kidney function, creatinine and eGFR, remains normal, as is liver function, AST and ALT.  - Vitamin D level remains low, I do recommend you take Vitamin D3 2000 units daily for bone health. - CBC shows no anemia or infection and TSH (thyroid) level normal. - Your cholesterol is still high, but continued recommendations to make lifestyle changes. Your LDL is above normal. The LDL is the bad cholesterol. Over time and in combination with inflammation and other factors, this contributes to plaque which in turn may lead to stroke and/or heart attack down the road. Sometimes high LDL is primarily genetic, and people might be eating all the right foods but still have high numbers. Other times, there is room for improvement in one's diet and eating healthier can bring this number down and potentially reduce one's risk of heart attack and/or stroke.   To reduce your LDL, Remember - more fruits and vegetables, more fish, and limit red meat and dairy products. More soy, nuts, beans, barley, lentils, oats and plant sterol ester enriched margarine instead of butter. I also encourage eliminating sugar and processed food. Remember, shop on the outside of the grocery store and visit your [employer]. If you would like to talk with me about dietary changes for your cholesterol, please let me know. We should recheck your cholesterol in 12 months.  Any questions? Keep being amazing!!  Thank you for allowing me to participate in your care.  I appreciate you. Kindest regards, Nachman Sundt

## 2022-07-28 DIAGNOSIS — M5416 Radiculopathy, lumbar region: Secondary | ICD-10-CM | POA: Diagnosis not present

## 2022-08-13 ENCOUNTER — Ambulatory Visit: Payer: Commercial Managed Care - PPO | Admitting: Nurse Practitioner

## 2022-08-13 ENCOUNTER — Encounter: Payer: Self-pay | Admitting: Nurse Practitioner

## 2022-08-13 VITALS — BP 123/81 | HR 98 | Temp 98.0°F | Ht 62.01 in | Wt 215.1 lb

## 2022-08-13 DIAGNOSIS — R052 Subacute cough: Secondary | ICD-10-CM

## 2022-08-13 MED ORDER — PREDNISONE 20 MG PO TABS: 40.0000 mg | ORAL_TABLET | Freq: Every day | ORAL | 0 refills | Status: AC

## 2022-08-13 MED ORDER — AMOXICILLIN-POT CLAVULANATE 875-125 MG PO TABS: 1.0000 | ORAL_TABLET | Freq: Two times a day (BID) | ORAL | 0 refills | Status: AC

## 2022-08-13 NOTE — Progress Notes (Signed)
BP 123/81   Pulse 98   Temp 98 F (36.7 C) (Oral)   Ht 5' 2.01" (1.575 m)   Wt 215 lb 1.6 oz (97.6 kg)   LMP 02/25/2009 (Approximate)   SpO2 95%   BMI 39.33 kg/m    Subjective:    Patient ID: Holly French, female    DOB: 26-Jul-1975, 47 y.o.   MRN: [id number]  HPI: Holly French is a 47 y.o. female  Chief Complaint  Patient presents with   Head Congestion    Has been going on for about a week   Cough   UPPER RESPIRATORY TRACT INFECTION Started two weeks ago with stuffy nose and headache.  No Covid testing at home.  Has underlying asthma and using inhalers more.  Has had one Covid vaccine. Fever: no Cough: yes Shortness of breath: yes Wheezing: yes Chest pain: no Chest tightness: yes Chest congestion: yes Nasal congestion: yes Runny nose: no Post nasal drip: no Sneezing: no Sore throat: yes Swollen glands: no Sinus pressure: yes Headache: yes Face pain: yes Toothache: yes Ear pain: none Ear pressure: no  Eyes red/itching:yes Eye drainage/crusting: no  Vomiting: no Rash: no Fatigue: yes Sick contacts: yes Strep contacts: no  Context: fluctuating Recurrent sinusitis: no Relief with OTC cold/cough medications: no  Treatments attempted: Delsym, Mucinex, Tylenol sinus  Relevant past medical, surgical, family and social history reviewed and updated as indicated. Interim medical history since our last visit reviewed. Allergies and medications reviewed and updated.  Review of Systems  Constitutional:  Positive for fatigue. Negative for activity change, appetite change, chills and fever.  HENT:  Positive for congestion, postnasal drip, rhinorrhea, sinus pressure, sinus pain and sore throat. Negative for ear discharge, ear pain, facial swelling, sneezing and voice change.   Eyes:  Negative for pain and visual disturbance.  Respiratory:  Positive for cough, chest tightness, shortness of breath and wheezing.   Cardiovascular:  Negative for chest pain,  palpitations and leg swelling.  Gastrointestinal: Negative.   Musculoskeletal:  Negative for myalgias.  Neurological:  Positive for headaches. Negative for dizziness and numbness.  Psychiatric/Behavioral: Negative.      Per HPI unless specifically indicated above     Objective:    BP 123/81   Pulse 98   Temp 98 F (36.7 C) (Oral)   Ht 5' 2.01" (1.575 m)   Wt 215 lb 1.6 oz (97.6 kg)   LMP 02/25/2009 (Approximate)   SpO2 95%   BMI 39.33 kg/m   Wt Readings from Last 3 Encounters:  08/13/22 215 lb 1.6 oz (97.6 kg)  07/26/22 217 lb 4.8 oz (98.6 kg)  06/22/22 215 lb 12.8 oz (97.9 kg)    Physical Exam Vitals and nursing note reviewed.  Constitutional:      General: She is awake. She is not in acute distress.    Appearance: She is well-developed and well-groomed. She is obese. She is ill-appearing. She is not toxic-appearing.  HENT:     Head: Normocephalic.     Right Ear: Hearing, ear canal and external ear normal. A middle ear effusion is present. Tympanic membrane is not injected or perforated.     Left Ear: Hearing, ear canal and external ear normal. A middle ear effusion is present. Tympanic membrane is not injected or perforated.     Nose: Rhinorrhea present. Rhinorrhea is clear.     Right Sinus: No maxillary sinus tenderness or frontal sinus tenderness.     Left Sinus: No maxillary sinus tenderness  or frontal sinus tenderness.     Mouth/Throat:     Mouth: Mucous membranes are moist.     Pharynx: Posterior oropharyngeal erythema (mild) present. No pharyngeal swelling or oropharyngeal exudate.  Eyes:     General: Lids are normal.        Right eye: No discharge.        Left eye: No discharge.     Conjunctiva/sclera: Conjunctivae normal.     Pupils: Pupils are equal, round, and reactive to light.  Neck:     Thyroid: No thyromegaly.     Vascular: No carotid bruit.  Cardiovascular:     Rate and Rhythm: Normal rate and regular rhythm.     Heart sounds: Normal heart sounds.  No murmur heard.    No gallop.  Pulmonary:     Effort: Pulmonary effort is normal. No accessory muscle usage or respiratory distress.     Breath sounds: Wheezing present. No decreased breath sounds or rhonchi.     Comments: Expiratory wheezes noted throughout, no rhonchi. Abdominal:     General: Bowel sounds are normal.     Palpations: Abdomen is soft. There is no hepatomegaly or splenomegaly.  Musculoskeletal:     Cervical back: Normal range of motion and neck supple.     Right lower leg: No edema.     Left lower leg: No edema.  Lymphadenopathy:     Head:     Right side of head: Submandibular adenopathy present. No submental, tonsillar, preauricular or posterior auricular adenopathy.     Left side of head: Submandibular adenopathy present. No submental, tonsillar, preauricular or posterior auricular adenopathy.     Cervical: No cervical adenopathy.  Skin:    General: Skin is warm and dry.  Neurological:     Mental Status: She is alert and oriented to person, place, and time.  Psychiatric:        Attention and Perception: Attention normal.        Mood and Affect: Mood normal.        Speech: Speech normal.        Behavior: Behavior normal. Behavior is cooperative.        Thought Content: Thought content normal.     Results for orders placed or performed in visit on 07/26/22  CBC with Differential/Platelet  Result Value Ref Range   WBC 7.0 3.4 - 10.8 x10E3/uL   RBC 4.09 3.77 - 5.28 x10E6/uL   Hemoglobin 13.2 11.1 - 15.9 g/dL   Hematocrit 39.1 34.0 - 46.6 %   MCV 96 79 - 97 fL   MCH 32.3 26.6 - 33.0 pg   MCHC 33.8 31.5 - 35.7 g/dL   RDW 12.6 11.7 - 15.4 %   Platelets 339 150 - 450 x10E3/uL   Neutrophils 59 Not Estab. %   Lymphs 30 Not Estab. %   Monocytes 7 Not Estab. %   Eos 3 Not Estab. %   Basos 1 Not Estab. %   Neutrophils Absolute 4.1 1.4 - 7.0 x10E3/uL   Lymphocytes Absolute 2.1 0.7 - 3.1 x10E3/uL   Monocytes Absolute 0.5 0.1 - 0.9 x10E3/uL   EOS (ABSOLUTE) 0.2  0.0 - 0.4 x10E3/uL   Basophils Absolute 0.1 0.0 - 0.2 x10E3/uL   Immature Granulocytes 0 Not Estab. %   Immature Grans (Abs) 0.0 0.0 - 0.1 x10E3/uL  Comprehensive metabolic panel  Result Value Ref Range   Glucose 101 (H) 70 - 99 mg/dL   BUN 9 6 - 24 mg/dL  Creatinine, Ser 0.64 0.57 - 1.00 mg/dL   eGFR 112 >59 mL/min/1.73   BUN/Creatinine Ratio 14 9 - 23   Sodium 138 134 - 144 mmol/L   Potassium 4.0 3.5 - 5.2 mmol/L   Chloride 100 96 - 106 mmol/L   CO2 21 20 - 29 mmol/L   Calcium 9.3 8.7 - 10.2 mg/dL   Total Protein 6.5 6.0 - 8.5 g/dL   Albumin 4.5 3.9 - 4.9 g/dL   Globulin, Total 2.0 1.5 - 4.5 g/dL   Albumin/Globulin Ratio 2.3 (H) 1.2 - 2.2   Bilirubin Total <0.2 0.0 - 1.2 mg/dL   Alkaline Phosphatase 87 44 - 121 IU/L   AST 16 0 - 40 IU/L   ALT 17 0 - 32 IU/L  Lipid Panel w/o Chol/HDL Ratio  Result Value Ref Range   Cholesterol, Total 209 (H) 100 - 199 mg/dL   Triglycerides 199 (H) 0 - 149 mg/dL   HDL 47 >39 mg/dL   VLDL Cholesterol Cal 35 5 - 40 mg/dL   LDL Chol Calc (NIH) 127 (H) 0 - 99 mg/dL  TSH  Result Value Ref Range   TSH 1.900 0.450 - 4.500 uIU/mL  VITAMIN D 25 Hydroxy (Vit-D Deficiency, Fractures)  Result Value Ref Range   Vit D, 25-Hydroxy 22.9 (L) 30.0 - 100.0 ng/mL  Urine Microalbumin w/creat. ratio  Result Value Ref Range   Creatinine, Urine 16.5 Not Estab. mg/dL   Microalbumin, Urine <3.0 Not Estab. ug/mL   Microalb/Creat Ratio <18 0 - 29 mg/g creat      Assessment & Plan:   Problem List Items Addressed This Visit       Other   Subacute cough - Primary    Acute and ongoing for over two weeks with underlying asthma.  Will defer Covid and flu testing as is past treatment period for both.  Start Augmentin BID for 7 days and Prednisone 40 MG daily for 5 days.  Recommend: - Increased rest - Increasing Fluids - Acetaminophen as needed for fever/pain.  - Salt water gargling, chloraseptic spray and throat lozenges - Mucinex.  - Humidifying the air.   Return to office for worsening or ongoing symptoms.        Follow up plan: Return if symptoms worsen or fail to improve.

## 2022-08-13 NOTE — Assessment & Plan Note (Signed)
Acute and ongoing for over two weeks with underlying asthma.  Will defer Covid and flu testing as is past treatment period for both.  Start Augmentin BID for 7 days and Prednisone 40 MG daily for 5 days.  Recommend: - Increased rest - Increasing Fluids - Acetaminophen as needed for fever/pain.  - Salt water gargling, chloraseptic spray and throat lozenges - Mucinex.  - Humidifying the air.  Return to office for worsening or ongoing symptoms.

## 2022-08-13 NOTE — Patient Instructions (Signed)

## 2022-08-19 ENCOUNTER — Other Ambulatory Visit: Payer: Self-pay | Admitting: Nurse Practitioner

## 2022-08-19 ENCOUNTER — Encounter: Payer: Self-pay | Admitting: Nurse Practitioner

## 2022-08-19 MED ORDER — ESZOPICLONE 3 MG PO TABS: 3.0000 mg | ORAL_TABLET | Freq: Every evening | ORAL | 0 refills | Status: DC | PRN

## 2022-08-19 MED ORDER — HYDROCODONE-ACETAMINOPHEN 7.5-325 MG PO TABS: 1.0000 | ORAL_TABLET | Freq: Three times a day (TID) | ORAL | 0 refills | Status: AC

## 2022-08-19 NOTE — Telephone Encounter (Signed)
Requested medication (s) are due for refill today: yes  Requested medication (s) are on the active medication list: yes  Last refill:  07/23/22 #30 0 refills  Future visit scheduled: yes in 6 months  Notes to clinic:  not delegated per protocol. Do you want to refill Rx?     Requested Prescriptions  Pending Prescriptions Disp Refills   Eszopiclone 3 MG TABS [Pharmacy Med Name: ESZOPICLONE 3 MG TABLET] 30 tablet     Sig: TAKE ONE TABLET BY MOUTH AT BEDTIME AS NEEDED     Not Delegated - Psychiatry:  Anxiolytics/Hypnotics Failed - 08/19/2022  8:15 AM      Failed - This refill cannot be delegated      Passed - Urine Drug Screen completed in last 360 days      Passed - Valid encounter within last 6 months    Recent Outpatient Visits           6 days ago Subacute cough   [place], Henrine Screws T, NP   3 weeks ago Continuous opioid dependence ([place])   [place], Henrine Screws T, NP   1 month ago Continuous opioid dependence ([place])   [place], Henrine Screws T, NP   2 months ago Continuous opioid dependence ([place])   [place], Henrine Screws T, NP   3 months ago Continuous opioid dependence [facility])   [place], Barbaraann Faster, NP       Future Appointments             In 6 months Cannady, Barbaraann Faster, NP [place], [facility]

## 2022-08-20 ENCOUNTER — Encounter: Payer: Self-pay | Admitting: Nurse Practitioner

## 2022-08-20 MED ORDER — DOXYCYCLINE HYCLATE 100 MG PO TABS: 100.0000 mg | ORAL_TABLET | Freq: Two times a day (BID) | ORAL | 0 refills | Status: AC

## 2022-08-24 ENCOUNTER — Ambulatory Visit: Payer: Commercial Managed Care - PPO | Admitting: Nurse Practitioner

## 2022-08-27 DIAGNOSIS — M545 Low back pain, unspecified: Secondary | ICD-10-CM | POA: Diagnosis not present

## 2022-08-27 DIAGNOSIS — G894 Chronic pain syndrome: Secondary | ICD-10-CM | POA: Diagnosis not present

## 2022-08-27 DIAGNOSIS — M5416 Radiculopathy, lumbar region: Secondary | ICD-10-CM | POA: Diagnosis not present

## 2022-08-27 DIAGNOSIS — Z79891 Long term (current) use of opiate analgesic: Secondary | ICD-10-CM | POA: Diagnosis not present

## 2022-08-27 DIAGNOSIS — Z79899 Other long term (current) drug therapy: Secondary | ICD-10-CM | POA: Diagnosis not present

## 2022-08-30 DIAGNOSIS — M5416 Radiculopathy, lumbar region: Secondary | ICD-10-CM | POA: Diagnosis not present

## 2022-09-15 ENCOUNTER — Other Ambulatory Visit: Payer: Self-pay | Admitting: Nurse Practitioner

## 2022-09-15 MED ORDER — ESZOPICLONE 3 MG PO TABS: 3.0000 mg | ORAL_TABLET | Freq: Every evening | ORAL | 2 refills | Status: DC | PRN

## 2022-09-28 DIAGNOSIS — G894 Chronic pain syndrome: Secondary | ICD-10-CM | POA: Diagnosis not present

## 2022-09-28 DIAGNOSIS — M5416 Radiculopathy, lumbar region: Secondary | ICD-10-CM | POA: Diagnosis not present

## 2022-09-28 DIAGNOSIS — Z6839 Body mass index (BMI) 39.0-39.9, adult: Secondary | ICD-10-CM | POA: Diagnosis not present

## 2022-09-28 DIAGNOSIS — M5106 Intervertebral disc disorders with myelopathy, lumbar region: Secondary | ICD-10-CM | POA: Diagnosis not present

## 2022-10-04 ENCOUNTER — Other Ambulatory Visit: Payer: Self-pay

## 2022-10-04 DIAGNOSIS — R059 Cough, unspecified: Secondary | ICD-10-CM

## 2022-10-04 DIAGNOSIS — J029 Acute pharyngitis, unspecified: Secondary | ICD-10-CM

## 2022-10-04 DIAGNOSIS — R0989 Other specified symptoms and signs involving the circulatory and respiratory systems: Secondary | ICD-10-CM

## 2022-10-04 MED ORDER — ALBUTEROL SULFATE HFA 108 (90 BASE) MCG/ACT IN AERS: 2.0000 | INHALATION_SPRAY | Freq: Four times a day (QID) | RESPIRATORY_TRACT | 4 refills | Status: DC | PRN

## 2022-10-04 NOTE — Telephone Encounter (Signed)
Medication refill for Albuterol HFA 90 mcg last ov 08/13/22, upcoming ov 02/15/23 . Please advise

## 2022-11-11 ENCOUNTER — Telehealth: Payer: Commercial Managed Care - PPO | Admitting: Nurse Practitioner

## 2022-11-11 DIAGNOSIS — J0140 Acute pansinusitis, unspecified: Secondary | ICD-10-CM

## 2022-11-11 MED ORDER — FLUTICASONE PROPIONATE 50 MCG/ACT NA SUSP: 2.0000 | Freq: Every day | NASAL | 6 refills | Status: AC

## 2022-11-11 MED ORDER — AMOXICILLIN-POT CLAVULANATE 875-125 MG PO TABS: 1.0000 | ORAL_TABLET | Freq: Two times a day (BID) | ORAL | 0 refills | Status: DC

## 2022-11-11 NOTE — Progress Notes (Signed)
E-Visit for Sinus Problems  We are sorry that you are not feeling well.  Here is how we plan to help!  Based on what you have shared with me it looks like you have sinusitis.  Sinusitis is inflammation and infection in the sinus cavities of the head.  Based on your presentation I believe you most likely have Acute Bacterial Sinusitis.  This is an infection caused by bacteria and is treated with antibiotics. I have prescribed Augmentin 875mg /125mg  one tablet twice daily with food, for 7 days. And a nasal spray for added support   Meds ordered this encounter  Medications   amoxicillin-clavulanate (AUGMENTIN) 875-125 MG tablet    Sig: Take 1 tablet by mouth 2 (two) times daily.    Dispense:  20 tablet    Refill:  0   fluticasone (FLONASE) 50 MCG/ACT nasal spray    Sig: Place 2 sprays into both nostrils daily.    Dispense:  16 g    Refill:  6    You may use an oral decongestant such as Mucinex D or if you have glaucoma or high blood pressure use plain Mucinex. Saline nasal spray help and can safely be used as often as needed for congestion.  If you develop worsening sinus pain, fever or notice severe headache and vision changes, or if symptoms are not better after completion of antibiotic, please schedule an appointment with a health care provider.    Sinus infections are not as easily transmitted as other respiratory infection, however we still recommend that you avoid close contact with loved ones, especially the very young and elderly.  Remember to wash your hands thoroughly throughout the day as this is the number one way to prevent the spread of infection!  Home Care: Only take medications as instructed by your medical team. Complete the entire course of an antibiotic. Do not take these medications with alcohol. A steam or ultrasonic humidifier can help congestion.  You can place a towel over your head and breathe in the steam from hot water coming from a faucet. Avoid close contacts  especially the very young and the elderly. Cover your mouth when you cough or sneeze. Always remember to wash your hands.  Get Help Right Away If: You develop worsening fever or sinus pain. You develop a severe head ache or visual changes. Your symptoms persist after you have completed your treatment plan.  Make sure you Understand these instructions. Will watch your condition. Will get help right away if you are not doing well or get worse.  Thank you for choosing an e-visit.  Your e-visit answers were reviewed by a board certified advanced clinical practitioner to complete your personal care plan. Depending upon the condition, your plan could have included both over the counter or prescription medications.  Please review your pharmacy choice. Make sure the pharmacy is open so you can pick up prescription now. If there is a problem, you may contact your provider through [employer] and have the prescription routed to another pharmacy.  Your safety is important to [place]. If you have drug allergies check your prescription carefully.   For the next 24 hours you can use MyChart to ask questions about today's visit, request a non-urgent call back, or ask for a work or school excuse. You will get an email in the next two days asking about your experience. I hope that your e-visit has been valuable and will speed your recovery.   I spent approximately 5 minutes reviewing  the patient's history, current symptoms and coordinating their care today.

## 2022-11-22 ENCOUNTER — Other Ambulatory Visit: Payer: Self-pay | Admitting: Nurse Practitioner

## 2022-11-23 DIAGNOSIS — G894 Chronic pain syndrome: Secondary | ICD-10-CM | POA: Diagnosis not present

## 2022-11-23 DIAGNOSIS — M5106 Intervertebral disc disorders with myelopathy, lumbar region: Secondary | ICD-10-CM | POA: Diagnosis not present

## 2022-11-23 DIAGNOSIS — M5416 Radiculopathy, lumbar region: Secondary | ICD-10-CM | POA: Diagnosis not present

## 2022-11-23 NOTE — Telephone Encounter (Signed)
Requested medication (s) are due for refill today: yes  Requested medication (s) are on the active medication list: yes  Last refill:  06/22/22  Future visit scheduled: yes  Notes to clinic:  Unable to refill per protocol, cannot delegate.      Requested Prescriptions  Pending Prescriptions Disp Refills   tiZANidine (ZANAFLEX) 4 MG capsule [Pharmacy Med Name: tiZANidine HCL 4 MG CAPSULE] 90 capsule 4    Sig: TAKE ONE CAPSULE BY MOUTH THREE TIMES A DAY     Not Delegated - Cardiovascular:  Alpha-2 Agonists - tizanidine Failed - 11/22/2022  8:47 AM      Failed - This refill cannot be delegated      Passed - Valid encounter within last 6 months    Recent Outpatient Visits           3 months ago Subacute cough   [place] [facility] [place], [place] T, NP   4 months ago Continuous opioid dependence (HCC)   [facility] [place], [place] T, NP   5 months ago Continuous opioid dependence (HCC)   [place] [facility] [place], Corrie Dandy T, NP   6 months ago Continuous opioid dependence (HCC)   [place] [facility] [place], Corrie Dandy T, NP   7 months ago Continuous opioid dependence [facility])   [place] [facility] [place], Dorie Rank, NP       Future Appointments             In 2 months Cannady, Dorie Rank, NP [place] [facility], [facility]

## 2022-12-08 ENCOUNTER — Other Ambulatory Visit: Payer: Self-pay | Admitting: Nurse Practitioner

## 2022-12-09 NOTE — Telephone Encounter (Signed)
Requested medication (s) are due for refill today: yes  Requested medication (s) are on the active medication list: yes  Last refill:  09/15/22  Future visit scheduled: yes  Notes to clinic:  Unable to refill per protocol, cannot delegate.      Requested Prescriptions  Pending Prescriptions Disp Refills   Eszopiclone 3 MG TABS [Pharmacy Med Name: ESZOPICLONE 3 MG TABLET] 30 tablet     Sig: TAKE ONE TABLET BY MOUTH AT BEDTIME AS NEEDED     Not Delegated - Psychiatry:  Anxiolytics/Hypnotics Failed - 12/08/2022 10:40 AM      Failed - This refill cannot be delegated      Passed - Urine Drug Screen completed in last 360 days      Passed - Valid encounter within last 6 months    Recent Outpatient Visits           3 months ago Subacute cough   [place] [facility] [place], [place] T, NP   4 months ago Continuous opioid dependence (HCC)   [place] [facility] [place], Caro T, NP   5 months ago Continuous opioid dependence (HCC)   [place] [facility] [place], Corrie Dandy T, NP   6 months ago Continuous opioid dependence (HCC)   [place] [facility] [place], Corrie Dandy T, NP   7 months ago Continuous opioid dependence [facility])   [place] [facility] [place], Dorie Rank, NP       Future Appointments             In 2 months Cannady, Dorie Rank, NP [place] [facility], [facility]

## 2022-12-21 DIAGNOSIS — M5416 Radiculopathy, lumbar region: Secondary | ICD-10-CM | POA: Diagnosis not present

## 2023-01-24 DIAGNOSIS — M545 Low back pain, unspecified: Secondary | ICD-10-CM | POA: Diagnosis not present

## 2023-01-24 DIAGNOSIS — M5416 Radiculopathy, lumbar region: Secondary | ICD-10-CM | POA: Diagnosis not present

## 2023-01-24 DIAGNOSIS — G894 Chronic pain syndrome: Secondary | ICD-10-CM | POA: Diagnosis not present

## 2023-02-12 NOTE — Patient Instructions (Signed)
Be Involved in Caring For Your Health:  Taking Medications When medications are taken as directed, they can greatly improve your health. But if they are not taken as prescribed, they may not work. In some cases, not taking them correctly can be harmful. To help ensure your treatment remains effective and safe, understand your medications and how to take them. Bring your medications to each visit for review by your provider.  Your lab results, notes, and after visit summary will be available on My Chart. We strongly encourage you to use this feature. If lab results are abnormal the clinic will contact you with the appropriate steps. If the clinic does not contact you assume the results are satisfactory. You can always view your results on My Chart. If you have questions regarding your health or results, please contact the clinic during office hours. You can also ask questions on My Chart.  We at [facility] are grateful that you chose us to provide your care. We strive to provide evidence-based and compassionate care and are always looking for feedback. If you get a survey from the clinic please complete this so we can hear your opinions.  DASH Eating Plan DASH stands for Dietary Approaches to Stop Hypertension. The DASH eating plan is a healthy eating plan that has been shown to: Lower high blood pressure (hypertension). Reduce your risk for type 2 diabetes, heart disease, and stroke. Help with weight loss. What are tips for following this plan? Reading food labels Check food labels for the amount of salt (sodium) per serving. Choose foods with less than 5 percent of the Daily Value (DV) of sodium. In general, foods with less than 300 milligrams (mg) of sodium per serving fit into this eating plan. To find whole grains, look for the word "whole" as the first word in the ingredient list. Shopping Buy products labeled as "low-sodium" or "no salt added." Buy fresh foods. Avoid canned  foods and pre-made or frozen meals. Cooking Try not to add salt when you cook. Use salt-free seasonings or herbs instead of table salt or sea salt. Check with your health care provider or pharmacist before using salt substitutes. Do not fry foods. Cook foods in healthy ways, such as baking, boiling, grilling, roasting, or broiling. Cook using oils that are good for your heart. These include olive, canola, avocado, soybean, and sunflower oil. Meal planning  Eat a balanced diet. This should include: 4 or more servings of fruits and 4 or more servings of vegetables each day. Try to fill half of your plate with fruits and vegetables. 6-8 servings of whole grains each day. 6 or less servings of lean meat, poultry, or fish each day. 1 oz is 1 serving. A 3 oz (85 g) serving of meat is about the same size as the palm of your hand. One egg is 1 oz (28 g). 2-3 servings of low-fat dairy each day. One serving is 1 cup (237 mL). 1 serving of nuts, seeds, or beans 5 times each week. 2-3 servings of heart-healthy fats. Healthy fats called omega-3 fatty acids are found in foods such as walnuts, flaxseeds, fortified milks, and eggs. These fats are also found in cold-water fish, such as sardines, salmon, and mackerel. Limit how much you eat of: Canned or prepackaged foods. Food that is high in trans fat, such as fried foods. Food that is high in saturated fat, such as fatty meat. Desserts and other sweets, sugary drinks, and other foods with added sugar. Full-fat   dairy products. Do not salt foods before eating. Do not eat more than 4 egg yolks a week. Try to eat at least 2 vegetarian meals a week. Eat more home-cooked food and less restaurant, buffet, and fast food. Lifestyle When eating at a restaurant, ask if your food can be made with less salt or no salt. If you drink alcohol: Limit how much you have to: 0-1 drink a day if you are female. 0-2 drinks a day if you are female. Know how much alcohol is in  your drink. In the [place] one drink is one 12 oz bottle of beer (355 mL), one 5 oz glass of wine (148 mL), or one 1 oz glass of hard liquor (44 mL). General information Avoid eating more than 2,300 mg of salt a day. If you have hypertension, you may need to reduce your sodium intake to 1,500 mg a day. Work with your provider to stay at a healthy body weight or lose weight. Ask what the best weight range is for you. On most days of the week, get at least 30 minutes of exercise that causes your heart to beat faster. This may include walking, swimming, or biking. Work with your provider or dietitian to adjust your eating plan to meet your specific calorie needs. What foods should I eat? Fruits All fresh, dried, or frozen fruit. Canned fruits that are in their natural juice and do not have sugar added to them. Vegetables Fresh or frozen vegetables that are raw, steamed, roasted, or grilled. Low-sodium or reduced-sodium tomato and vegetable juice. Low-sodium or reduced-sodium tomato sauce and tomato paste. Low-sodium or reduced-sodium canned vegetables. Grains Whole-grain or whole-wheat bread. Whole-grain or whole-wheat pasta. Brown rice. Oatmeal. Quinoa. Bulgur. Whole-grain and low-sodium cereals. Pita bread. Low-fat, low-sodium crackers. Whole-wheat flour tortillas. Meats and other proteins Skinless chicken or turkey. Ground chicken or turkey. Pork with fat trimmed off. Fish and seafood. Egg whites. Dried beans, peas, or lentils. Unsalted nuts, nut butters, and seeds. Unsalted canned beans. Lean cuts of beef with fat trimmed off. Low-sodium, lean precooked or cured meat, such as sausages or meat loaves. Dairy Low-fat (1%) or fat-free (skim) milk. Reduced-fat, low-fat, or fat-free cheeses. Nonfat, low-sodium ricotta or cottage cheese. Low-fat or nonfat yogurt. Low-fat, low-sodium cheese. Fats and oils Soft margarine without trans fats. Vegetable oil. Reduced-fat, low-fat, or light mayonnaise and salad  dressings (reduced-sodium). Canola, safflower, olive, avocado, soybean, and sunflower oils. Avocado. Seasonings and condiments Herbs. Spices. Seasoning mixes without salt. Other foods Unsalted popcorn and pretzels. Fat-free sweets. The items listed above may not be all the foods and drinks you can have. Talk to a dietitian to learn more. What foods should I avoid? Fruits Canned fruit in a light or heavy syrup. Fried fruit. Fruit in cream or butter sauce. Vegetables Creamed or fried vegetables. Vegetables in a cheese sauce. Regular canned vegetables that are not marked as low-sodium or reduced-sodium. Regular canned tomato sauce and paste that are not marked as low-sodium or reduced-sodium. Regular tomato and vegetable juices that are not marked as low-sodium or reduced-sodium. Pickles. Olives. Grains Baked goods made with fat, such as croissants, muffins, or some breads. Dry pasta or rice meal packs. Meats and other proteins Fatty cuts of meat. Ribs. Fried meat. Bacon. Bologna, salami, and other precooked or cured meats, such as sausages or meat loaves, that are not lean and low in sodium. Fat from the back of a pig (fatback). Bratwurst. Salted nuts and seeds. Canned beans with added salt. Canned   or smoked fish. Whole eggs or egg yolks. Chicken or turkey with skin. Dairy Whole or 2% milk, cream, and half-and-half. Whole or full-fat cream cheese. Whole-fat or sweetened yogurt. Full-fat cheese. Nondairy creamers. Whipped toppings. Processed cheese and cheese spreads. Fats and oils Butter. Stick margarine. Lard. Shortening. Ghee. Bacon fat. Tropical oils, such as coconut, palm kernel, or palm oil. Seasonings and condiments Onion salt, garlic salt, seasoned salt, table salt, and sea salt. Worcestershire sauce. Tartar sauce. Barbecue sauce. Teriyaki sauce. Soy sauce, including reduced-sodium soy sauce. Steak sauce. Canned and packaged gravies. Fish sauce. Oyster sauce. Cocktail sauce. Store-bought  horseradish. Ketchup. Mustard. Meat flavorings and tenderizers. Bouillon cubes. Hot sauces. Pre-made or packaged marinades. Pre-made or packaged taco seasonings. Relishes. Regular salad dressings. Other foods Salted popcorn and pretzels. The items listed above may not be all the foods and drinks you should avoid. Talk to a dietitian to learn more. Where to find more information National Heart, Lung, and Blood Institute (NHLBI): nhlbi.nih.gov American Heart Association (AHA): heart.org Academy of Nutrition and Dietetics: eatright.org National Kidney Foundation (NKF): kidney.org This information is not intended to replace advice given to you by your health care provider. Make sure you discuss any questions you have with your health care provider. Document Revised: 06/17/2022 Document Reviewed: 06/17/2022 Elsevier Patient Education  2024 Elsevier Inc.  

## 2023-02-15 ENCOUNTER — Encounter: Payer: Self-pay | Admitting: Nurse Practitioner

## 2023-02-15 ENCOUNTER — Ambulatory Visit: Payer: Commercial Managed Care - PPO | Admitting: Nurse Practitioner

## 2023-02-15 VITALS — BP 127/77 | HR 97 | Temp 97.6°F | Ht 62.0 in | Wt 222.2 lb

## 2023-02-15 DIAGNOSIS — F5104 Psychophysiologic insomnia: Secondary | ICD-10-CM

## 2023-02-15 DIAGNOSIS — F419 Anxiety disorder, unspecified: Secondary | ICD-10-CM | POA: Diagnosis not present

## 2023-02-15 DIAGNOSIS — G894 Chronic pain syndrome: Secondary | ICD-10-CM | POA: Diagnosis not present

## 2023-02-15 DIAGNOSIS — F1120 Opioid dependence, uncomplicated: Secondary | ICD-10-CM | POA: Diagnosis not present

## 2023-02-15 DIAGNOSIS — Z6841 Body Mass Index (BMI) 40.0 and over, adult: Secondary | ICD-10-CM

## 2023-02-15 DIAGNOSIS — F17210 Nicotine dependence, cigarettes, uncomplicated: Secondary | ICD-10-CM | POA: Diagnosis not present

## 2023-02-15 DIAGNOSIS — I10 Essential (primary) hypertension: Secondary | ICD-10-CM | POA: Diagnosis not present

## 2023-02-15 DIAGNOSIS — J321 Chronic frontal sinusitis: Secondary | ICD-10-CM

## 2023-02-15 MED ORDER — PREDNISONE 20 MG PO TABS: 40.0000 mg | ORAL_TABLET | Freq: Every day | ORAL | 0 refills | Status: AC

## 2023-02-15 MED ORDER — ESZOPICLONE 3 MG PO TABS: 3.0000 mg | ORAL_TABLET | Freq: Every evening | ORAL | 2 refills | Status: DC | PRN

## 2023-02-15 MED ORDER — AMOXICILLIN-POT CLAVULANATE ER 1000-62.5 MG PO TB12: 2.0000 | ORAL_TABLET | Freq: Two times a day (BID) | ORAL | 0 refills | Status: AC

## 2023-02-15 NOTE — Assessment & Plan Note (Signed)
Chronic, ongoing.  Continue Lunesta as needed only and Amitriptyline at HS for sleep.  Drug screen up to date and contract on file.

## 2023-02-15 NOTE — Assessment & Plan Note (Signed)
Chronic issue with current acute flare post-Covid infection one month ago. Will send in script for Augmentin XR which works best for her and Prednisone burst.  Recommend she continue all at home regimen.   - Increased rest - Increasing Fluids - Acetaminophen as needed for fever/pain.  - Mucinex.  - Saline sinus flushes or a neti pot.  - Humidifying the air Return to office for any worsening or ongoing symptoms.

## 2023-02-15 NOTE — Assessment & Plan Note (Signed)
I have recommended complete cessation of tobacco use. I have discussed various options available for assistance with tobacco cessation including over the counter methods (Nicotine gum, patch and lozenges). We also discussed prescription options (Chantix, Nicotine Inhaler / Nasal Spray). The patient is not interested in pursuing any prescription tobacco cessation options at this time.  

## 2023-02-15 NOTE — Assessment & Plan Note (Signed)
Refer to chronic opioid plan of care. 

## 2023-02-15 NOTE — Assessment & Plan Note (Signed)
BMI 40.64.  Recommended eating smaller high protein, low fat meals more frequently and exercising 30 mins a day 5 times a week with a goal of 10-15lb weight loss in the next 3 months. Patient voiced their understanding and motivation to adhere to these recommendations.

## 2023-02-15 NOTE — Assessment & Plan Note (Signed)
Chronic, stable.  BP at goal in office today.  Recommend she monitor BP at least a few mornings a week at home and document.  DASH diet at home.  Continue current medication regimen and adjust as needed.  LABS: up to date.  Refills up to date.

## 2023-02-15 NOTE — Assessment & Plan Note (Signed)
Chronic, ongoing.  She is followed by pain management in [place] at this time, continue this collaboration.  They are refilling all pain related medications.

## 2023-02-15 NOTE — Progress Notes (Signed)
BP 127/77   Pulse 97   Temp 97.6 F (36.4 C) (Oral)   Ht 5\' 2"  (1.575 m)   Wt 222 lb 3.2 oz (100.8 kg)   LMP 02/25/2009 (Approximate)   SpO2 98%   BMI 40.64 kg/m    Subjective:    Patient ID: Holly French, female    DOB: Apr 20, 1976, 47 y.o.   MRN: [id number]  HPI: SARENNA CORFIELD is a 47 y.o. female  Chief Complaint  Patient presents with   Anxiety   Hypertension   Pain   HYPERTENSION Continues on Losartan 100 MG.    Continues to smoke about 1/2 PPD, started smoking off and on since age 45.    Hypertension status: stable  Satisfied with current treatment? yes Duration of hypertension: chronic BP monitoring frequency: none BP range:  BP medication side effects:  no Medication compliance: good compliance Aspirin: no Recurrent headaches: no Visual changes: no Palpitations: no Dyspnea: no Chest pain: no Lower extremity edema: no Dizzy/lightheaded: no  The 10-year ASCVD risk score (Arnett DK, et al., 2019) is: 4.9%   Values used to calculate the score:     Age: 40 years     Sex: Female     Is Non-Hispanic African American: No     Diabetic: No     Tobacco smoker: Yes     Systolic Blood Pressure: 127 mmHg     Is BP treated: Yes     HDL Cholesterol: 47 mg/dL     Total Cholesterol: 209 mg/dL  CHRONIC PAIN  [facility] [place], they have done two cortisone injections -- they fill Norco and other pain medications.  History of lumbar fusion L5-S1 -- 2010.  Last Norco refill was 01/28/23. Present dose:  22.5 Morphine equivalents Pain control status: stable Duration: chronic Location: lower back and down right leg Quality: dull, aching, and throbbing  Current Pain Level:  6/10  Previous Pain Levels: 8-9/10  Breakthrough pain: no Benefit from narcotic medications: yes What Activities task can be accomplished with current medication? Can do all her ADLs and work full-time Interested in Social research officer, government off narcotics:no   Stool  softners/OTC fiber: no  Previous pain specialty evaluation: yes Non-narcotic analgesic meds: yes Narcotic contract: up to date  UPPER RESPIRATORY TRACT INFECTION Had Covid one month ago and still having sinus congestion.   Fever: no Cough: occasional Shortness of breath: yes sometimes Wheezing: yes sometimes Chest pain: no Chest tightness: yes Chest congestion: yes Nasal congestion: yes Runny nose: no Post nasal drip: no Sneezing: no Sore throat: no Swollen glands: no Sinus pressure: yes Headache: yes Face pain: yes Toothache: yes Ear pain: no Ear pressure: no Eyes red/itching:no Eye drainage/crusting: no  Vomiting: no Rash: no Fatigue: yes Sick contacts: yes Strep contacts: no  Context: fluctuating Recurrent sinusitis: no Relief with OTC cold/cough medications: no  Treatments attempted: Cold and sinus   ANXIETY Continues on Duloxetine.  Takes Alfonso Patten only as needed (last fill 02/07/23) + Amitriptyline. Mood status: stable Satisfied with current treatment?: yes Symptom severity: moderate  Duration of current treatment : chronic Side effects: no Medication compliance: good compliance Psychotherapy/counseling: none Depressed mood: no Anxious mood: yes Anhedonia: no Significant weight loss or gain: no Insomnia: yes hard to fall asleep -- Lunesta helps on the nights she needs to work Fatigue: no Feelings of worthlessness or guilt: no Impaired concentration/indecisiveness: no Suicidal ideations: no Hopelessness: no Crying spells: no    02/15/2023    4:11  PM 07/26/2022    3:40 PM 05/25/2022    3:52 PM 05/03/2022    2:16 PM 04/23/2022    3:16 PM  Depression screen PHQ 2/9  Decreased Interest 1 0 1 0 1  Down, Depressed, Hopeless 0 0 0 0 0  PHQ - 2 Score 1 0 1 0 1  Altered sleeping 1 1 1  1   Tired, decreased energy 2 2 1  1   Change in appetite 1 0 0  0  Feeling bad or failure about yourself  0 1 0  0  Trouble concentrating 0 0 1  0  Moving slowly or  fidgety/restless 0 0 0  0  Suicidal thoughts 0 0 0  0  PHQ-9 Score 5 4 4  3   Difficult doing work/chores Not difficult at all  Somewhat difficult  Not difficult at all       02/15/2023    4:11 PM 07/26/2022    3:40 PM 05/25/2022    3:52 PM 04/23/2022    3:17 PM  GAD 7 : Generalized Anxiety Score  Nervous, Anxious, on Edge 1 2 1  0  Control/stop worrying 1 1 0 0  Worry too much - different things 1 0 0 0  Trouble relaxing 2 2 1 2   Restless 1 0 0 0  Easily annoyed or irritable 2 1 0 1  Afraid - awful might happen 1 0 0 0  Total GAD 7 Score 9 6 2 3   Anxiety Difficulty Somewhat difficult Not difficult at all Not difficult at all Not difficult at all     Relevant past medical, surgical, family and social history reviewed and updated as indicated. Interim medical history since our last visit reviewed. Allergies and medications reviewed and updated.  Review of Systems  Constitutional:  Positive for fatigue. Negative for activity change, appetite change, chills and fever.  HENT:  Positive for congestion, sinus pressure and sinus pain. Negative for ear discharge, ear pain, facial swelling, postnasal drip, rhinorrhea, sneezing, sore throat and voice change.   Respiratory:  Positive for cough, chest tightness, shortness of breath and wheezing.   Cardiovascular:  Negative for chest pain, palpitations and leg swelling.  Gastrointestinal: Negative.   Endocrine: Negative.   Musculoskeletal:  Negative for myalgias.  Neurological:  Positive for headaches. Negative for dizziness and numbness.  Psychiatric/Behavioral: Negative.      Per HPI unless specifically indicated above     Objective:    BP 127/77   Pulse 97   Temp 97.6 F (36.4 C) (Oral)   Ht 5\' 2"  (1.575 m)   Wt 222 lb 3.2 oz (100.8 kg)   LMP 02/25/2009 (Approximate)   SpO2 98%   BMI 40.64 kg/m   Wt Readings from Last 3 Encounters:  02/15/23 222 lb 3.2 oz (100.8 kg)  08/13/22 215 lb 1.6 oz (97.6 kg)  07/26/22 217 lb 4.8 oz  (98.6 kg)    Physical Exam Vitals and nursing note reviewed.  Constitutional:      General: She is awake. She is not in acute distress.    Appearance: She is well-developed and well-groomed. She is obese. She is ill-appearing. She is not toxic-appearing.  HENT:     Head: Normocephalic.     Right Ear: Hearing, ear canal and external ear normal. A middle ear effusion is present. Tympanic membrane is not injected or perforated.     Left Ear: Hearing, ear canal and external ear normal. A middle ear effusion is present. Tympanic membrane is not  injected or perforated.     Nose: Rhinorrhea present. Rhinorrhea is clear.     Right Turbinates: Swollen.     Left Turbinates: Swollen.     Right Sinus: Maxillary sinus tenderness and frontal sinus tenderness present.     Left Sinus: Maxillary sinus tenderness and frontal sinus tenderness present.     Mouth/Throat:     Mouth: Mucous membranes are moist.     Pharynx: Posterior oropharyngeal erythema (mild) present. No pharyngeal swelling or oropharyngeal exudate.  Eyes:     General: Lids are normal.        Right eye: No discharge.        Left eye: No discharge.     Conjunctiva/sclera: Conjunctivae normal.     Pupils: Pupils are equal, round, and reactive to light.  Neck:     Thyroid: No thyromegaly.     Vascular: No carotid bruit.  Cardiovascular:     Rate and Rhythm: Normal rate and regular rhythm.     Heart sounds: Normal heart sounds. No murmur heard.    No gallop.  Pulmonary:     Effort: Pulmonary effort is normal. No accessory muscle usage or respiratory distress.     Breath sounds: Wheezing (occasional expiratory) present. No decreased breath sounds or rhonchi.  Abdominal:     General: Bowel sounds are normal.     Palpations: Abdomen is soft. There is no hepatomegaly or splenomegaly.  Musculoskeletal:     Cervical back: Normal range of motion and neck supple.     Right lower leg: No edema.     Left lower leg: No edema.   Lymphadenopathy:     Head:     Right side of head: Submandibular and tonsillar adenopathy present. No submental, preauricular or posterior auricular adenopathy.     Left side of head: Submandibular and tonsillar adenopathy present. No submental, preauricular or posterior auricular adenopathy.     Cervical: No cervical adenopathy.  Skin:    General: Skin is warm and dry.  Neurological:     Mental Status: She is alert and oriented to person, place, and time.  Psychiatric:        Attention and Perception: Attention normal.        Mood and Affect: Mood normal.        Speech: Speech normal.        Behavior: Behavior normal. Behavior is cooperative.        Thought Content: Thought content normal.    Results for orders placed or performed in visit on 07/26/22  CBC with Differential/Platelet  Result Value Ref Range   WBC 7.0 3.4 - 10.8 x10E3/uL   RBC 4.09 3.77 - 5.28 x10E6/uL   Hemoglobin 13.2 11.1 - 15.9 g/dL   Hematocrit 95.2 84.1 - 46.6 %   MCV 96 79 - 97 fL   MCH 32.3 26.6 - 33.0 pg   MCHC 33.8 31.5 - 35.7 g/dL   RDW 32.4 40.1 - 02.7 %   Platelets 339 150 - 450 x10E3/uL   Neutrophils 59 Not Estab. %   Lymphs 30 Not Estab. %   Monocytes 7 Not Estab. %   Eos 3 Not Estab. %   Basos 1 Not Estab. %   Neutrophils Absolute 4.1 1.4 - 7.0 x10E3/uL   Lymphocytes Absolute 2.1 0.7 - 3.1 x10E3/uL   Monocytes Absolute 0.5 0.1 - 0.9 x10E3/uL   EOS (ABSOLUTE) 0.2 0.0 - 0.4 x10E3/uL   Basophils Absolute 0.1 0.0 - 0.2 x10E3/uL   Immature  Granulocytes 0 Not Estab. %   Immature Grans (Abs) 0.0 0.0 - 0.1 x10E3/uL  Comprehensive metabolic panel  Result Value Ref Range   Glucose 101 (H) 70 - 99 mg/dL   BUN 9 6 - 24 mg/dL   Creatinine, Ser 4.69 0.57 - 1.00 mg/dL   eGFR 629 >52 WU/XLK/4.40   BUN/Creatinine Ratio 14 9 - 23   Sodium 138 134 - 144 mmol/L   Potassium 4.0 3.5 - 5.2 mmol/L   Chloride 100 96 - 106 mmol/L   CO2 21 20 - 29 mmol/L   Calcium 9.3 8.7 - 10.2 mg/dL   Total Protein 6.5  6.0 - 8.5 g/dL   Albumin 4.5 3.9 - 4.9 g/dL   Globulin, Total 2.0 1.5 - 4.5 g/dL   Albumin/Globulin Ratio 2.3 (H) 1.2 - 2.2   Bilirubin Total <0.2 0.0 - 1.2 mg/dL   Alkaline Phosphatase 87 44 - 121 IU/L   AST 16 0 - 40 IU/L   ALT 17 0 - 32 IU/L  Lipid Panel w/o Chol/HDL Ratio  Result Value Ref Range   Cholesterol, Total 209 (H) 100 - 199 mg/dL   Triglycerides 102 (H) 0 - 149 mg/dL   HDL 47 >72 mg/dL   VLDL Cholesterol Cal 35 5 - 40 mg/dL   LDL Chol Calc (NIH) 536 (H) 0 - 99 mg/dL  TSH  Result Value Ref Range   TSH 1.900 0.450 - 4.500 uIU/mL  VITAMIN D 25 Hydroxy (Vit-D Deficiency, Fractures)  Result Value Ref Range   Vit D, 25-Hydroxy 22.9 (L) 30.0 - 100.0 ng/mL  Urine Microalbumin w/creat. ratio  Result Value Ref Range   Creatinine, Urine 16.5 Not Estab. mg/dL   Microalbumin, Urine <6.4 Not Estab. ug/mL   Microalb/Creat Ratio <18 0 - 29 mg/g creat      Assessment & Plan:   Problem List Items Addressed This Visit       Cardiovascular and Mediastinum   Hypertension    Chronic, stable.  BP at goal in office today.  Recommend she monitor BP at least a few mornings a week at home and document.  DASH diet at home.  Continue current medication regimen and adjust as needed.  LABS: up to date.  Refills up to date.          Respiratory   Chronic sinusitis    Chronic issue with current acute flare post-Covid infection one month ago. Will send in script for Augmentin XR which works best for her and Prednisone burst.  Recommend she continue all at home regimen.   - Increased rest - Increasing Fluids - Acetaminophen as needed for fever/pain.  - Mucinex.  - Saline sinus flushes or a neti pot.  - Humidifying the air Return to office for any worsening or ongoing symptoms.      Relevant Medications   amoxicillin-clavulanate (AUGMENTIN XR) 1000-62.5 MG 12 hr tablet   predniSONE (DELTASONE) 20 MG tablet     Other   Chronic pain syndrome (Chronic)    Refer to chronic opioid plan  of care.      Relevant Medications   HYDROcodone-acetaminophen (NORCO) 7.5-325 MG tablet   predniSONE (DELTASONE) 20 MG tablet   Anxiety    Chronic, ongoing.  Denies SI/HI.  Continue Duloxetine, which is beneficial to both pain and mood.  Refills up to date.      Continuous opioid dependence (HCC) - Primary    Chronic, ongoing.  She is followed by pain management in [place] at this time,  continue this collaboration.  They are refilling all pain related medications.      Insomnia    Chronic, ongoing.  Continue Lunesta as needed only and Amitriptyline at HS for sleep.  Drug screen up to date and contract on file.      Nicotine dependence, cigarettes, uncomplicated    I have recommended complete cessation of tobacco use. I have discussed various options available for assistance with tobacco cessation including over the counter methods (Nicotine gum, patch and lozenges). We also discussed prescription options (Chantix, Nicotine Inhaler / Nasal Spray). The patient is not interested in pursuing any prescription tobacco cessation options at this time.       Obesity    BMI 40.64.  Recommended eating smaller high protein, low fat meals more frequently and exercising 30 mins a day 5 times a week with a goal of 10-15lb weight loss in the next 3 months. Patient voiced their understanding and motivation to adhere to these recommendations.         Follow up plan: Return for Annual physical after 07/26/22 with pap.

## 2023-02-15 NOTE — Assessment & Plan Note (Signed)
Chronic, ongoing.  Denies SI/HI.  Continue Duloxetine, which is beneficial to both pain and mood.  Refills up to date.

## 2023-03-23 ENCOUNTER — Other Ambulatory Visit: Payer: Self-pay | Admitting: Nurse Practitioner

## 2023-03-23 NOTE — Telephone Encounter (Signed)
Requested medication (s) are due for refill today: yes  Requested medication (s) are on the active medication list: yes  Last refill:  03/22/22  Future visit scheduled: yes  Notes to clinic:  Manual Review: Route requests for 50,000 IU strength to the provider.     Requested Prescriptions  Pending Prescriptions Disp Refills   Cholecalciferol (VITAMIN D3) 1.25 MG (50000 UT) CAPS [Pharmacy Med Name: VITAMIN D3 (CHOLECAL) 50,000 IU CAP] 12 capsule     Sig: TAKE 1 CAPSULE BY MOUTH ONCE WEEKLY     Endocrinology:  Vitamins - Vitamin D Supplementation 2 Failed - 03/23/2023  7:59 AM      Failed - Manual Review: Route requests for 50,000 IU strength to the provider      Failed - Vitamin D in normal range and within 360 days    25-Hydroxy, Vitamin D-3  Date Value Ref Range Status  03/08/2022 29 ng/mL Final    Comment:    This test was developed and its performance characteristics determined by [facility]. It has not been cleared or approved by the Food and Drug Administration.    25-Hydroxy, Vitamin D-2  Date Value Ref Range Status  03/08/2022 <1.0 ng/mL Final    Comment:    This test was developed and its performance characteristics determined by [facility]. It has not been cleared or approved by the Food and Drug Administration.    25-Hydroxy, Vitamin D  Date Value Ref Range Status  03/08/2022 29 (L) ng/mL Final    Comment:    Reference Range: All Ages: Target levels 30 - 100    Vit D, 25-Hydroxy  Date Value Ref Range Status  07/26/2022 22.9 (L) 30.0 - 100.0 ng/mL Final    Comment:    Vitamin D deficiency has been defined by the Institute of Medicine and an Endocrine Society practice guideline as a level of serum 25-OH vitamin D less than 20 ng/mL (1,2). The Endocrine Society went on to further define vitamin D insufficiency as a level between 21 and 29 ng/mL (2). 1. IOM (Institute of Medicine). 2010. Dietary reference    intakes for calcium and D. [place]: The     [employer]. 2. Holick MF, Binkley Beaumont, Bischoff-Ferrari HA, et al.    Evaluation, treatment, and prevention of vitamin D    deficiency: an Endocrine Society clinical practice    guideline. JCEM. 2011 Jul; 96(7):1911-30.          Passed - Ca in normal range and within 360 days    Calcium  Date Value Ref Range Status  07/26/2022 9.3 8.7 - 10.2 mg/dL Final         Passed - Valid encounter within last 12 months    Recent Outpatient Visits           1 month ago Continuous opioid dependence (HCC)   [place] [facility] [place], Corrie Dandy T, NP   7 months ago Subacute cough   [place] [facility] [place], [place] T, NP   8 months ago Continuous opioid dependence (HCC)   [place] [facility] [place], [place] T, NP   9 months ago Continuous opioid dependence (HCC)   [place] [facility] [place], [place] T, NP   10 months ago Continuous opioid dependence [facility])    [facility] [place], Corrie Dandy T, NP       Future Appointments             In 4 months  Marjie Skiff, NP [place] [facility], [facility]

## 2023-04-25 DIAGNOSIS — M545 Low back pain, unspecified: Secondary | ICD-10-CM | POA: Diagnosis not present

## 2023-04-25 DIAGNOSIS — M5106 Intervertebral disc disorders with myelopathy, lumbar region: Secondary | ICD-10-CM | POA: Diagnosis not present

## 2023-04-25 DIAGNOSIS — M5416 Radiculopathy, lumbar region: Secondary | ICD-10-CM | POA: Diagnosis not present

## 2023-04-25 DIAGNOSIS — G894 Chronic pain syndrome: Secondary | ICD-10-CM | POA: Diagnosis not present

## 2023-04-25 DIAGNOSIS — Z79891 Long term (current) use of opiate analgesic: Secondary | ICD-10-CM | POA: Diagnosis not present

## 2023-05-30 ENCOUNTER — Other Ambulatory Visit: Payer: Self-pay | Admitting: Nurse Practitioner

## 2023-05-31 NOTE — Telephone Encounter (Signed)
Requested medications are due for refill today.  yes  Requested medications are on the active medications list.  yes  Last refill. 02/15/2023  #30 2 rf  Future visit scheduled.   yes  Notes to clinic.  Refill not delegated.    Requested Prescriptions  Pending Prescriptions Disp Refills   Eszopiclone 3 MG TABS [Pharmacy Med Name: ESZOPICLONE 3 MG TABLET] 30 tablet     Sig: TAKE ONE TABLET BY MOUTH AT BEDTIME AS NEEDED     Not Delegated - Psychiatry:  Anxiolytics/Hypnotics Failed - 05/31/2023 11:44 AM      Failed - This refill cannot be delegated      Failed - Urine Drug Screen completed in last 360 days      Passed - Valid encounter within last 6 months    Recent Outpatient Visits           3 months ago Continuous opioid dependence (HCC)   [place] [facility] [place], Corrie Dandy T, NP   9 months ago Subacute cough   [facility] [place], [place] T, NP   10 months ago Continuous opioid dependence (HCC)   [place] [facility] [place], Corrie Dandy T, NP   11 months ago Continuous opioid dependence (HCC)   [place] [facility] [place], Corrie Dandy T, NP   1 year ago Continuous opioid dependence [facility])   [place] [facility] [place], Dorie Rank, NP       Future Appointments             In 1 month [place], Dorie Rank, NP [place] [facility], [facility]

## 2023-06-28 DIAGNOSIS — G894 Chronic pain syndrome: Secondary | ICD-10-CM | POA: Diagnosis not present

## 2023-06-28 DIAGNOSIS — M5416 Radiculopathy, lumbar region: Secondary | ICD-10-CM | POA: Diagnosis not present

## 2023-06-28 DIAGNOSIS — M5106 Intervertebral disc disorders with myelopathy, lumbar region: Secondary | ICD-10-CM | POA: Diagnosis not present

## 2023-07-16 ENCOUNTER — Other Ambulatory Visit: Payer: Self-pay | Admitting: Nurse Practitioner

## 2023-07-16 DIAGNOSIS — F1120 Opioid dependence, uncomplicated: Secondary | ICD-10-CM

## 2023-07-18 NOTE — Telephone Encounter (Signed)
Requested Prescriptions  Pending Prescriptions Disp Refills   amitriptyline (ELAVIL) 50 MG tablet [Pharmacy Med Name: AMITRIPTYLINE HCL 50 MG TAB] 90 tablet 0    Sig: TAKE 1 TABLET BY MOUTH AT BEDTIME     Psychiatry:  Antidepressants - Heterocyclics (TCAs) Passed - 07/18/2023 12:19 PM      Passed - Valid encounter within last 6 months    Recent Outpatient Visits           5 months ago Continuous opioid dependence (HCC)   [place] [facility] [place], [place] T, NP   11 months ago Subacute cough   [place] [facility] [place], [place] T, NP   11 months ago Continuous opioid dependence (HCC)   [place] [facility] [place], [place] T, NP   1 year ago Continuous opioid dependence (HCC)   [place] [facility] [place], Corrie Dandy T, NP   1 year ago Continuous opioid dependence (HCC)   [place] [facility] [place], Corrie Dandy T, NP       Future Appointments             In 1 week Cannady, Dorie Rank, NP [place] [facility], [facility]             DULoxetine (CYMBALTA) 30 MG capsule [Pharmacy Med Name: DULoxetine HCL DR 30 MG CAPSULE] 180 capsule 0    Sig: TAKE 1 CAPSULE BY MOUTH TWICE A DAY     Psychiatry: Antidepressants - SNRI - duloxetine Passed - 07/18/2023 12:19 PM      Passed - Cr in normal range and within 360 days    Creatinine  Date Value Ref Range Status  02/22/2022 27.8 20.0 - 300.0 mg/dL Final   Creatinine, Ser  Date Value Ref Range Status  07/26/2022 0.64 0.57 - 1.00 mg/dL Final         Passed - eGFR is 30 or above and within 360 days    GFR calc Af Amer  Date Value Ref Range Status  04/09/2020 104 >59 mL/min/1.73 Final    Comment:    **In accordance with recommendations from the NKF-ASN Task force,**   [facility] is in the process of updating its eGFR calculation to the   2021 CKD-EPI creatinine equation that estimates kidney function   without a race variable.    GFR calc non Af  Amer  Date Value Ref Range Status  04/09/2020 90 >59 mL/min/1.73 Final   eGFR  Date Value Ref Range Status  07/26/2022 112 >59 mL/min/1.73 Final         Passed - Completed PHQ-2 or PHQ-9 in the last 360 days      Passed - Last BP in normal range    BP Readings from Last 1 Encounters:  02/15/23 127/77         Passed - Valid encounter within last 6 months    Recent Outpatient Visits           5 months ago Continuous opioid dependence (HCC)   [place] [facility] [place], [place] T, NP   11 months ago Subacute cough   [place] [facility] [place], [place] T, NP   11 months ago Continuous opioid dependence (HCC)   [place] [facility] [place], Corrie Dandy T, NP   1 year ago Continuous opioid dependence [facility])   [place] [facility] [place], Corrie Dandy T, NP   1 year ago Continuous opioid dependence [facility])   [place]  [facility] [place], Dorie Rank, NP       Future Appointments             In 1 week Marjie Skiff, NP [place] [facility], [facility]

## 2023-07-24 NOTE — Patient Instructions (Signed)
Be Involved in Caring For Your Health:  Taking Medications When medications are taken as directed, they can greatly improve your health. But if they are not taken as prescribed, they may not work. In some cases, not taking them correctly can be harmful. To help ensure your treatment remains effective and safe, understand your medications and how to take them. Bring your medications to each visit for review by your provider.  Your lab results, notes, and after visit summary will be available on My Chart. We strongly encourage you to use this feature. If lab results are abnormal the clinic will contact you with the appropriate steps. If the clinic does not contact you assume the results are satisfactory. You can always view your results on My Chart. If you have questions regarding your health or results, please contact the clinic during office hours. You can also ask questions on My Chart.  We at [facility] are grateful that you chose [place] to provide your care. We strive to provide evidence-based and compassionate care and are always looking for feedback. If you get a survey from the clinic please complete this so we can hear your opinions.  Heart-Healthy Eating Plan Many factors influence your heart health, including eating and exercise habits. Heart health is also called coronary health. Coronary risk increases with abnormal blood fat (lipid) levels. A heart-healthy eating plan includes limiting unhealthy fats, increasing healthy fats, limiting salt (sodium) intake, and making other diet and lifestyle changes. What is my plan? Your health care provider may recommend that: You limit your fat intake to _________% or less of your total calories each day. You limit your saturated fat intake to _________% or less of your total calories each day. You limit the amount of cholesterol in your diet to less than _________ mg per day. You limit the amount of sodium in your diet to less than _________  mg per day. What are tips for following this plan? Cooking Cook foods using methods other than frying. Baking, boiling, grilling, and broiling are all good options. Other ways to reduce fat include: Removing the skin from poultry. Removing all visible fats from meats. Steaming vegetables in water or broth. Meal planning  At meals, imagine dividing your plate into fourths: Fill one-half of your plate with vegetables and green salads. Fill one-fourth of your plate with whole grains. Fill one-fourth of your plate with lean protein foods. Eat 2-4 cups of vegetables per day. One cup of vegetables equals 1 cup (91 g) broccoli or cauliflower florets, 2 medium carrots, 1 large bell pepper, 1 large sweet potato, 1 large tomato, 1 medium white potato, 2 cups (150 g) raw leafy greens. Eat 1-2 cups of fruit per day. One cup of fruit equals 1 small apple, 1 large banana, 1 cup (237 g) mixed fruit, 1 large orange,  cup (82 g) dried fruit, 1 cup (240 mL) 100% fruit juice. Eat more foods that contain soluble fiber. Examples include apples, broccoli, carrots, beans, peas, and barley. Aim to get 25-30 g of fiber per day. Increase your consumption of legumes, nuts, and seeds to 4-5 servings per week. One serving of dried beans or legumes equals  cup (90 g) cooked, 1 serving of nuts is  oz (12 almonds, 24 pistachios, or 7 walnut halves), and 1 serving of seeds equals  oz (8 g). Fats Choose healthy fats more often. Choose monounsaturated and polyunsaturated fats, such as olive and canola oils, avocado oil, flaxseeds, walnuts, almonds, and seeds. Eat  more omega-3 fats. Choose salmon, mackerel, sardines, tuna, flaxseed oil, and ground flaxseeds. Aim to eat fish at least 2 times each week. Check food labels carefully to identify foods with trans fats or high amounts of saturated fat. Limit saturated fats. These are found in animal products, such as meats, butter, and cream. Plant sources of saturated fats  include palm oil, palm kernel oil, and coconut oil. Avoid foods with partially hydrogenated oils in them. These contain trans fats. Examples are stick margarine, some tub margarines, cookies, crackers, and other baked goods. Avoid fried foods. General information Eat more home-cooked food and less restaurant, buffet, and fast food. Limit or avoid alcohol. Limit foods that are high in added sugar and simple starches such as foods made using white refined flour (white breads, pastries, sweets). Lose weight if you are overweight. Losing just 5-10% of your body weight can help your overall health and prevent diseases such as diabetes and heart disease. Monitor your sodium intake, especially if you have high blood pressure. Talk with your health care provider about your sodium intake. Try to incorporate more vegetarian meals weekly. What foods should I eat? Fruits All fresh, canned (in natural juice), or frozen fruits. Vegetables Fresh or frozen vegetables (raw, steamed, roasted, or grilled). Green salads. Grains Most grains. Choose whole wheat and whole grains most of the time. Rice and pasta, including brown rice and pastas made with whole wheat. Meats and other proteins Lean, well-trimmed beef, veal, pork, and lamb. Chicken and [place] without skin. All fish and shellfish. Wild duck, rabbit, pheasant, and venison. Egg whites or low-cholesterol egg substitutes. Dried beans, peas, lentils, and tofu. Seeds and most nuts. Dairy Low-fat or nonfat cheeses, including ricotta and mozzarella. Skim or 1% milk (liquid, powdered, or evaporated). Buttermilk made with low-fat milk. Nonfat or low-fat yogurt. Fats and oils Non-hydrogenated (trans-free) margarines. Vegetable oils, including soybean, sesame, sunflower, olive, avocado, peanut, safflower, corn, canola, and cottonseed. Salad dressings or mayonnaise made with a vegetable oil. Beverages Water (mineral or sparkling). Coffee and tea. Unsweetened ice  tea. Diet beverages. Sweets and desserts Sherbet, gelatin, and fruit ice. Small amounts of dark chocolate. Limit all sweets and desserts. Seasonings and condiments All seasonings and condiments. The items listed above may not be a complete list of foods and beverages you can eat. Contact a dietitian for more options. What foods should I avoid? Fruits Canned fruit in heavy syrup. Fruit in cream or butter sauce. Fried fruit. Limit coconut. Vegetables Vegetables cooked in cheese, cream, or butter sauce. Fried vegetables. Grains Breads made with saturated or trans fats, oils, or whole milk. Croissants. Sweet rolls. Donuts. High-fat crackers, such as cheese crackers and chips. Meats and other proteins Fatty meats, such as hot dogs, ribs, sausage, bacon, rib-eye roast or steak. High-fat deli meats, such as salami and bologna. Caviar. Domestic duck and goose. Organ meats, such as liver. Dairy Cream, sour cream, cream cheese, and creamed cottage cheese. Whole-milk cheeses. Whole or 2% milk (liquid, evaporated, or condensed). Whole buttermilk. Cream sauce or high-fat cheese sauce. Whole-milk yogurt. Fats and oils Meat fat, or shortening. Cocoa butter, hydrogenated oils, palm oil, coconut oil, palm kernel oil. Solid fats and shortenings, including bacon fat, salt pork, lard, and butter. Nondairy cream substitutes. Salad dressings with cheese or sour cream. Beverages Regular sodas and any drinks with added sugar. Sweets and desserts Frosting. Pudding. Cookies. Cakes. Pies. Milk chocolate or white chocolate. Buttered syrups. Full-fat ice cream or ice cream drinks. The items listed above may  not be a complete list of foods and beverages to avoid. Contact a dietitian for more information. Summary Heart-healthy meal planning includes limiting unhealthy fats, increasing healthy fats, limiting salt (sodium) intake and making other diet and lifestyle changes. Lose weight if you are overweight. Losing just  5-10% of your body weight can help your overall health and prevent diseases such as diabetes and heart disease. Focus on eating a balance of foods, including fruits and vegetables, low-fat or nonfat dairy, lean protein, nuts and legumes, whole grains, and heart-healthy oils and fats. This information is not intended to replace advice given to you by your health care provider. Make sure you discuss any questions you have with your health care provider. Document Revised: 07/06/2021 Document Reviewed: 07/06/2021 Elsevier Patient Education  2024 [employer].

## 2023-07-29 ENCOUNTER — Encounter: Payer: Self-pay | Admitting: Nurse Practitioner

## 2023-07-29 ENCOUNTER — Ambulatory Visit (INDEPENDENT_AMBULATORY_CARE_PROVIDER_SITE_OTHER): Payer: Commercial Managed Care - PPO | Admitting: Nurse Practitioner

## 2023-07-29 ENCOUNTER — Other Ambulatory Visit (HOSPITAL_COMMUNITY)
Admission: RE | Admit: 2023-07-29 | Discharge: 2023-07-29 | Disposition: A | Discharge status: Home / Self Care | Payer: Commercial Managed Care - PPO | Source: Ambulatory Visit | Attending: Nurse Practitioner | Admitting: Nurse Practitioner

## 2023-07-29 VITALS — BP 125/71 | HR 119 | Temp 98.1°F | Ht 62.0 in | Wt 228.5 lb

## 2023-07-29 DIAGNOSIS — Z124 Encounter for screening for malignant neoplasm of cervix: Secondary | ICD-10-CM | POA: Diagnosis not present

## 2023-07-29 DIAGNOSIS — E7800 Pure hypercholesterolemia, unspecified: Secondary | ICD-10-CM | POA: Diagnosis not present

## 2023-07-29 DIAGNOSIS — J4520 Mild intermittent asthma, uncomplicated: Secondary | ICD-10-CM | POA: Diagnosis not present

## 2023-07-29 DIAGNOSIS — F5104 Psychophysiologic insomnia: Secondary | ICD-10-CM

## 2023-07-29 DIAGNOSIS — I10 Essential (primary) hypertension: Secondary | ICD-10-CM | POA: Diagnosis not present

## 2023-07-29 DIAGNOSIS — F17210 Nicotine dependence, cigarettes, uncomplicated: Secondary | ICD-10-CM

## 2023-07-29 DIAGNOSIS — E559 Vitamin D deficiency, unspecified: Secondary | ICD-10-CM

## 2023-07-29 DIAGNOSIS — F419 Anxiety disorder, unspecified: Secondary | ICD-10-CM

## 2023-07-29 DIAGNOSIS — Z1211 Encounter for screening for malignant neoplasm of colon: Secondary | ICD-10-CM

## 2023-07-29 DIAGNOSIS — Z0000 Encounter for general adult medical examination without abnormal findings: Secondary | ICD-10-CM | POA: Diagnosis not present

## 2023-07-29 DIAGNOSIS — F1120 Opioid dependence, uncomplicated: Secondary | ICD-10-CM

## 2023-07-29 DIAGNOSIS — Z6841 Body Mass Index (BMI) 40.0 and over, adult: Secondary | ICD-10-CM

## 2023-07-29 DIAGNOSIS — G894 Chronic pain syndrome: Secondary | ICD-10-CM

## 2023-07-29 DIAGNOSIS — E66813 Obesity, class 3: Secondary | ICD-10-CM

## 2023-07-29 MED ORDER — BUSPIRONE HCL 5 MG PO TABS: 5.0000 mg | ORAL_TABLET | Freq: Two times a day (BID) | ORAL | 4 refills | Status: DC

## 2023-07-29 NOTE — Progress Notes (Signed)
 BP 125/71   Pulse 90   Temp 98.1 F (36.7 C)   Ht 5\' 2"  (1.575 m)   Wt 228 lb 8 oz (103.6 kg)   LMP 02/25/2009 (Approximate)   SpO2 97%   BMI 41.79 kg/m    Subjective:    Patient ID: TUCKER French, female    DOB: 1975/06/21, 48 y.o.   MRN: [id number]  HPI: Holly French is a 48 y.o. female presenting on 07/29/2023 for comprehensive medical examination. Current medical complaints include:none  She currently lives with: husband Menopausal Symptoms: no  HYPERTENSION Continues on Losartan 100 MG.  Smoking about 1/2 to 1 PPD, started smoking off and on since age 80.  She would like to quit.    Asthma and minimally uses Albuterol -- only with weather changes. Hypertension status: stable  Satisfied with current treatment? yes Duration of hypertension: chronic BP monitoring frequency: none BP range:  BP medication side effects:  no Medication compliance: good compliance Aspirin: no Recurrent headaches: no Visual changes: no Palpitations: no Dyspnea: no Chest pain: no Lower extremity edema: no Dizzy/lightheaded: no  The 10-year ASCVD risk score (Arnett DK, et al., 2019) is: 4%   Values used to calculate the score:     Age: 34 years     Sex: Female     Is Non-Hispanic African American: No     Diabetic: No     Tobacco smoker: Yes     Systolic Blood Pressure: 125 mmHg     Is BP treated: Yes     HDL Cholesterol: 59 mg/dL     Total Cholesterol: 230 mg/dL  CHRONIC PAIN  Follows [facility] for pain management, returns next week for injection.  Pain is worsening. History of lumbar fusion L5-S1 -- 2010.  Continues Norco for pain. Present dose:  22.5 Morphine equivalents Pain control status: stable Duration: chronic Location: lower back and down right leg Quality: dull, aching, and throbbing -- numbness across bottom of back Current Pain Level:  6/10  Previous Pain Levels: 8-9/10  Breakthrough pain: no Benefit from narcotic medications: yes What  Activities task can be accomplished with current medication? Can do all her ADLs and work full-time Interested in Social research officer, government off narcotics:no   Stool softners/OTC fiber: no  Previous pain specialty evaluation: yes Non-narcotic analgesic meds: yes Narcotic contract: up to date  ANXIETY Continues on Duloxetine. Takes Lunesta only as needed + Amitriptyline. Does endorse some increase anxiety. Mood status: stable Satisfied with current treatment?: yes Symptom severity: moderate  Duration of current treatment : chronic Side effects: no Medication compliance: good compliance Psychotherapy/counseling: none Depressed mood: no Anxious mood: yes -- more irritability Anhedonia: prefers to be at home more Significant weight loss or gain: no Insomnia: yes hard to fall asleep -- if has to go to work next day takes [place] Fatigue: no Feelings of worthlessness or guilt: no Impaired concentration/indecisiveness: no Suicidal ideations: no Hopelessness: no Crying spells: no    07/29/2023    2:03 PM 02/15/2023    4:11 PM 07/26/2022    3:40 PM 05/25/2022    3:52 PM 05/03/2022    2:16 PM  Depression screen PHQ 2/9  Decreased Interest 1 1 0 1 0  Down, Depressed, Hopeless 0 0 0 0 0  PHQ - 2 Score 1 1 0 1 0  Altered sleeping 2 1 1 1    Tired, decreased energy 2 2 2 1    Change in appetite 1 1 0 0   Feeling  bad or failure about yourself  1 0 1 0   Trouble concentrating 0 0 0 1   Moving slowly or fidgety/restless 0 0 0 0   Suicidal thoughts 0 0 0 0   PHQ-9 Score 7 5 4 4    Difficult doing work/chores Not difficult at all Not difficult at all  Somewhat difficult        07/29/2023    2:03 PM 02/15/2023    4:11 PM 07/26/2022    3:40 PM 05/25/2022    3:52 PM  GAD 7 : Generalized Anxiety Score  Nervous, Anxious, on Edge 2 1 2 1   Control/stop worrying 1 1 1  0  Worry too much - different things 1 1 0 0  Trouble relaxing 2 2 2 1   Restless 1 1 0 0  Easily annoyed or irritable 3 2 1  0  Afraid - awful  might happen 1 1 0 0  Total GAD 7 Score 11 9 6 2   Anxiety Difficulty Somewhat difficult Somewhat difficult Not difficult at all Not difficult at all      05/03/2022    2:16 PM 05/25/2022    3:52 PM 07/26/2022    3:40 PM 02/15/2023    4:10 PM 07/29/2023    2:03 PM  Fall Risk  Falls in the past year? 0 0 0 0 0  Was there an injury with Fall? 0 0 0 0 0  Fall Risk Category Calculator 0 0 0 0 0  Fall Risk Category (Retired) Low Low     (RETIRED) Patient Fall Risk Level Low fall risk      Patient at Risk for Falls Due to  No Fall Risks No Fall Risks No Fall Risks No Fall Risks  Fall risk Follow up  Falls evaluation completed Falls evaluation completed Falls evaluation completed Falls evaluation completed    Functional Status Survey: Is the patient deaf or have difficulty hearing?: No Does the patient have difficulty seeing, even when wearing glasses/contacts?: No Does the patient have difficulty concentrating, remembering, or making decisions?: No Does the patient have difficulty walking or climbing stairs?: No Does the patient have difficulty dressing or bathing?: No Does the patient have difficulty doing errands alone such as visiting a doctor's office or shopping?: No    Past Medical History:  Past Medical History:  Diagnosis Date   Allergy    Asthma    Chronic urticaria    Dermatitis    Eczema    Hayfever    Hypertension    Vitamin D deficiency     Surgical History:  Past Surgical History:  Procedure Laterality Date   ABDOMINAL HYSTERECTOMY     EPF and gastroc recession Right 01/14/2016   Per MD notes   laparoscopies     x3   NASAL SINUS SURGERY     x3   PLANTAR FASCIA RELEASE     SPINAL FUSION     lumbar    Medications:  Current Outpatient Medications on File Prior to Visit  Medication Sig   albuterol (VENTOLIN HFA) 108 (90 Base) MCG/ACT inhaler Inhale 2 puffs into the lungs every 6 (six) hours as needed for wheezing or shortness of breath.   amitriptyline  (ELAVIL) 50 MG tablet TAKE 1 TABLET BY MOUTH AT BEDTIME   diazepam (VALIUM) 5 MG tablet Take 5 mg by mouth 2 (two) times daily as needed.   HYDROcodone-acetaminophen (NORCO) 7.5-325 MG tablet Take 1 tablet by mouth 3 (three) times daily as needed.   magnesium  oxide (MAG-OX) 400 (240 Mg) MG tablet Take 400 mg by mouth daily.   Melatonin 10 MG CAPS Take by mouth at bedtime as needed.    triamcinolone ointment (KENALOG) 0.5 % APPLY TO THE AFFECTED AREA(S) 2 TIMES A DAY   fluticasone (FLONASE) 50 MCG/ACT nasal spray Place 2 sprays into both nostrils daily. (Patient not taking: Reported on 07/29/2023)   Current Facility-Administered Medications on File Prior to Visit  Medication   triamcinolone acetonide (KENALOG-40) injection 40 mg    Allergies:  Allergies  Allergen Reactions   Covid-19 (Mrna) Vaccine Proofreader) [Covid-19 (Mrna) Vaccine]    Neosporin [Neomycin-Bacitracin Zn-Polymyx] Dermatitis   Latex Rash    Social History:  Social History   Socioeconomic History   Marital status: Married    Spouse name: Not on file   Number of children: Not on file   Years of education: Not on file   Highest education level: Bachelor's degree (e.g., BA, AB, BS)  Occupational History   Not on file  Tobacco Use   Smoking status: Every Day    Current packs/day: 0.50    Average packs/day: 0.5 packs/day for 18.0 years (9.0 ttl pk-yrs)    Types: Cigarettes   Smokeless tobacco: Never   Tobacco comments:    Off and on since 18  Vaping Use   Vaping status: Never Used  Substance and Sexual Activity   Alcohol use: No   Drug use: No   Sexual activity: Yes    Birth control/protection: Surgical    Comment: Hysterectomy  Other Topics Concern   Not on file  Social History Narrative   Not on file   Social Drivers of Health   Financial Resource Strain: Low Risk  (07/23/2023)   Overall Financial Resource Strain (CARDIA)    Difficulty of Paying Living Expenses: Not very hard  Food Insecurity: No Food  Insecurity (07/23/2023)   Hunger Vital Sign    Worried About Running Out of Food in the Last Year: Never true    Ran Out of Food in the Last Year: Never true  Transportation Needs: No Transportation Needs (07/23/2023)   PRAPARE - Administrator, Civil Service (Medical): No    Lack of Transportation (Non-Medical): No  Physical Activity: Sufficiently Active (07/23/2023)   Exercise Vital Sign    Days of Exercise per Week: 3 days    Minutes of Exercise per Session: 60 min  Stress: Stress Concern Present (07/23/2023)   [employer] of Occupational Health - Occupational Stress Questionnaire    Feeling of Stress : Rather much  Social Connections: Socially Isolated (07/23/2023)   Social Connection and Isolation Panel [NHANES]    Frequency of Communication with Friends and Family: Once a week    Frequency of Social Gatherings with Friends and Family: Once a week    Attends Religious Services: Never    Database administrator or Organizations: No    Attends Engineer, structural: Not on file    Marital Status: Married  Catering manager Violence: Not At Risk (07/13/2021)   Humiliation, Afraid, Rape, and Kick questionnaire    Fear of Current or Ex-Partner: No    Emotionally Abused: No    Physically Abused: No    Sexually Abused: No   Social History   Tobacco Use  Smoking Status Every Day   Current packs/day: 0.50   Average packs/day: 0.5 packs/day for 18.0 years (9.0 ttl pk-yrs)   Types: Cigarettes  Smokeless Tobacco Never  Tobacco Comments  Off and on since 37   Social History   Substance and Sexual Activity  Alcohol Use No    Family History:  Family History  Problem Relation Age of Onset   Diabetes Mother    Early death Mother    Hyperlipidemia Father    Allergies Daughter    COPD Maternal Grandmother    Lupus Maternal Grandmother    Cancer Maternal Grandfather        colon   Breast cancer Neg Hx     Past medical history, surgical history, medications,  allergies, family history and social history reviewed with patient today and changes made to appropriate areas of the chart.   ROS All other ROS negative except what is listed above and in the HPI.      Objective:    BP 125/71   Pulse 90   Temp 98.1 F (36.7 C)   Ht 5\' 2"  (1.575 m)   Wt 228 lb 8 oz (103.6 kg)   LMP 02/25/2009 (Approximate)   SpO2 97%   BMI 41.79 kg/m   Wt Readings from Last 3 Encounters:  07/29/23 228 lb 8 oz (103.6 kg)  02/15/23 222 lb 3.2 oz (100.8 kg)  08/13/22 215 lb 1.6 oz (97.6 kg)    Physical Exam Vitals and nursing note reviewed. Exam conducted with a chaperone present.  Constitutional:      General: She is awake. She is not in acute distress.    Appearance: She is well-developed. She is not ill-appearing.  HENT:     Head: Normocephalic and atraumatic.     Right Ear: Hearing, tympanic membrane, ear canal and external ear normal. No drainage.     Left Ear: Hearing, tympanic membrane, ear canal and external ear normal. No drainage.     Nose: Nose normal.     Right Sinus: No maxillary sinus tenderness or frontal sinus tenderness.     Left Sinus: No maxillary sinus tenderness or frontal sinus tenderness.     Mouth/Throat:     Mouth: Mucous membranes are moist.     Pharynx: Oropharynx is clear. Uvula midline. No pharyngeal swelling, oropharyngeal exudate or posterior oropharyngeal erythema.  Eyes:     General: Lids are normal.        Right eye: No discharge.        Left eye: No discharge.     Extraocular Movements: Extraocular movements intact.     Conjunctiva/sclera: Conjunctivae normal.     Pupils: Pupils are equal, round, and reactive to light.     Visual Fields: Right eye visual fields normal and left eye visual fields normal.  Neck:     Thyroid: No thyromegaly.     Vascular: No carotid bruit.     Trachea: Trachea normal.  Cardiovascular:     Rate and Rhythm: Normal rate and regular rhythm.     Heart sounds: Normal heart sounds. No murmur  heard.    No gallop.  Pulmonary:     Effort: Pulmonary effort is normal. No accessory muscle usage or respiratory distress.     Breath sounds: Normal breath sounds.  Chest:  Breasts:    Right: Normal.     Left: Normal.  Abdominal:     General: Bowel sounds are normal.     Palpations: Abdomen is soft. There is no hepatomegaly or splenomegaly.     Tenderness: There is no abdominal tenderness.     Hernia: There is no hernia in the left inguinal area or right inguinal area.  Genitourinary:    Exam position: Lithotomy position.     Labia:        Right: No rash.        Left: No rash.      Urethra: No prolapse.     Vagina: Normal.     Cervix: Normal.     Uterus: Absent.      Rectum: Normal.  Musculoskeletal:        General: Normal range of motion.     Cervical back: Normal range of motion and neck supple.     Right lower leg: No edema.     Left lower leg: No edema.  Lymphadenopathy:     Head:     Right side of head: No submental, submandibular, tonsillar, preauricular or posterior auricular adenopathy.     Left side of head: No submental, submandibular, tonsillar, preauricular or posterior auricular adenopathy.     Cervical: No cervical adenopathy.     Upper Body:     Right upper body: No supraclavicular, axillary or pectoral adenopathy.     Left upper body: No supraclavicular, axillary or pectoral adenopathy.  Skin:    General: Skin is warm and dry.     Capillary Refill: Capillary refill takes less than 2 seconds.     Findings: No rash.  Neurological:     Mental Status: She is alert and oriented to person, place, and time.     Gait: Gait is intact.     Deep Tendon Reflexes: Reflexes are normal and symmetric.     Reflex Scores:      Brachioradialis reflexes are 2+ on the right side and 2+ on the left side.      Patellar reflexes are 2+ on the right side and 2+ on the left side. Psychiatric:        Attention and Perception: Attention normal.        Mood and Affect: Mood  normal.        Speech: Speech normal.        Behavior: Behavior normal. Behavior is cooperative.        Thought Content: Thought content normal.        Judgment: Judgment normal.    Results for orders placed or performed in visit on 07/29/23  CBC with Differential/Platelet   Collection Time: 07/29/23  3:02 PM  Result Value Ref Range   WBC 8.2 3.4 - 10.8 x10E3/uL   RBC 4.22 3.77 - 5.28 x10E6/uL   Hemoglobin 13.7 11.1 - 15.9 g/dL   Hematocrit 16.1 09.6 - 46.6 %   MCV 94 79 - 97 fL   MCH 32.5 26.6 - 33.0 pg   MCHC 34.4 31.5 - 35.7 g/dL   RDW 04.5 40.9 - 81.1 %   Platelets 384 150 - 450 x10E3/uL   Neutrophils 64 Not Estab. %   Lymphs 24 Not Estab. %   Monocytes 7 Not Estab. %   Eos 3 Not Estab. %   Basos 1 Not Estab. %   Neutrophils Absolute 5.3 1.4 - 7.0 x10E3/uL   Lymphocytes Absolute 2.0 0.7 - 3.1 x10E3/uL   Monocytes Absolute 0.6 0.1 - 0.9 x10E3/uL   EOS (ABSOLUTE) 0.2 0.0 - 0.4 x10E3/uL   Basophils Absolute 0.1 0.0 - 0.2 x10E3/uL   Immature Granulocytes 1 Not Estab. %   Immature Grans (Abs) 0.0 0.0 - 0.1 x10E3/uL  Comprehensive metabolic panel   Collection Time: 07/29/23  3:02 PM  Result Value Ref Range   Glucose 85 70 - 99  mg/dL   BUN 11 6 - 24 mg/dL   Creatinine, Ser 1.61 0.57 - 1.00 mg/dL   eGFR 096 >04 VW/UJW/1.19   BUN/Creatinine Ratio 16 9 - 23   Sodium 137 134 - 144 mmol/L   Potassium 4.6 3.5 - 5.2 mmol/L   Chloride 99 96 - 106 mmol/L   CO2 22 20 - 29 mmol/L   Calcium 9.1 8.7 - 10.2 mg/dL   Total Protein 6.8 6.0 - 8.5 g/dL   Albumin 4.5 3.9 - 4.9 g/dL   Globulin, Total 2.3 1.5 - 4.5 g/dL   Bilirubin Total <1.4 0.0 - 1.2 mg/dL   Alkaline Phosphatase 90 44 - 121 IU/L   AST 19 0 - 40 IU/L   ALT 24 0 - 32 IU/L  TSH   Collection Time: 07/29/23  3:02 PM  Result Value Ref Range   TSH 1.090 0.450 - 4.500 uIU/mL  Lipid Panel w/o Chol/HDL Ratio   Collection Time: 07/29/23  3:02 PM  Result Value Ref Range   Cholesterol, Total 230 (H) 100 - 199 mg/dL    Triglycerides 782 (H) 0 - 149 mg/dL   HDL 59 >95 mg/dL   VLDL Cholesterol Cal 43 (H) 5 - 40 mg/dL   LDL Chol Calc (NIH) 621 (H) 0 - 99 mg/dL  VITAMIN D 25 Hydroxy (Vit-D Deficiency, Fractures)   Collection Time: 07/29/23  3:02 PM  Result Value Ref Range   Vit D, 25-Hydroxy 47.4 30.0 - 100.0 ng/mL      Assessment & Plan:   Problem List Items Addressed This Visit       Cardiovascular and Mediastinum   Hypertension   Chronic, stable.  BP at goal in office today.  Recommend she monitor BP at least a few mornings a week at home and document.  DASH diet at home.  Continue current medication regimen and adjust as needed.  LABS: CBC, CMP, TSH.  Refills up to date.        Relevant Medications   losartan (COZAAR) 50 MG tablet   Other Relevant Orders   CBC with Differential/Platelet (Completed)   Comprehensive metabolic panel (Completed)   TSH (Completed)     Respiratory   Asthma   Chronic, stable with minimal Albuterol use.  Recommend complete cessation of smoking.  Notify provider if increased Albuterol use.  CBC today.        Other   Chronic pain syndrome (Chronic)   Refer to chronic opioid plan of care.      Relevant Medications   DULoxetine (CYMBALTA) 30 MG capsule   tiZANidine (ZANAFLEX) 4 MG capsule   Anxiety   Chronic, ongoing.  Denies SI/HI.  Continue Duloxetine, which is beneficial to both pain and mood.  Refills sent.  Will add on Buspar 5 MG BID due to increased anxiety, educated her on this medication.  Monitor use with Amitriptyline on board.  Would avoid Wellbutrin, which would benefit smoking cessation and mood, as could reduce seizure threshold taken with Amitriptyline.        Relevant Medications   busPIRone (BUSPAR) 5 MG tablet   DULoxetine (CYMBALTA) 30 MG capsule   Continuous opioid dependence (HCC) - Primary   Chronic, ongoing.  She is followed by pain management in [place] at this time, continue this collaboration.  They are refilling all pain related  medications.      Relevant Medications   DULoxetine (CYMBALTA) 30 MG capsule   Elevated low density lipoprotein (LDL) cholesterol level   Noted past labs.  ASCVD 4%, LDL <190.  At this time continue diet and exercise focus.  Recheck lipid panel today.      Relevant Orders   Comprehensive metabolic panel (Completed)   Lipid Panel w/o Chol/HDL Ratio (Completed)   Insomnia   Chronic, ongoing.  Continue Lunesta as needed only and Amitriptyline at HS for sleep.  Drug screen up to date and contract on file.      Nicotine dependence, cigarettes, uncomplicated   Will trial nicotine patches.  Avoid Chantix and Wellbutrin due to possible interactions with other medications.      Relevant Medications   nicotine (NICODERM CQ - DOSED IN MG/24 HOURS) 21 mg/24hr patch   Obesity   BMI 41.79.  Recommended eating smaller high protein, low fat meals more frequently and exercising 30 mins a day 5 times a week with a goal of 10-15lb weight loss in the next 3 months. Patient voiced their understanding and motivation to adhere to these recommendations.       Vitamin D deficiency   Chronic, ongoing.  Check today and continue supplement as needed.      Relevant Orders   VITAMIN D 25 Hydroxy (Vit-D Deficiency, Fractures) (Completed)   Other Visit Diagnoses       Cervical cancer screening       Pap performed and sent to lab.   Relevant Orders   Cytology - PAP     Screen for colon cancer       Referral to GI placed   Relevant Orders   Ambulatory referral to Gastroenterology     Encounter for annual physical exam       Annual physical today with labs and health maintenance reviewed, discussed with patient.        Follow up plan: Return in about 6 months (around 01/26/2024) for HTN/HLD, MOOD.   LABORATORY TESTING:  - Pap smear: Performed today -- history of hysterectomy, uterus and one fallopian tube years ago  IMMUNIZATIONS:   - Tetanus vaccination status reviewed: last tetanus booster  within 10 years. - Influenza: Up to date - Pneumovax: Not applicable - Prevnar: Not applicable - COVID: Up to date x 2 -- allergic to them - HPV: Not applicable - Shingrix vaccine: Not applicable  SCREENING: -Mammogram: Up To Date 07/09/22 - will obtain this year - Colonoscopy: Ordered today - Bone Density: Not applicable  -Hearing Test: Not applicable  -Spirometry: Not applicable   PATIENT COUNSELING:   Advised to take 1 mg of folate supplement per day if capable of pregnancy.   Sexuality: Discussed sexually transmitted diseases, partner selection, use of condoms, avoidance of unintended pregnancy  and contraceptive alternatives.   Advised to avoid cigarette smoking.  I discussed with the patient that most people either abstain from alcohol or drink within safe limits (<=14/week and <=4 drinks/occasion for males, <=7/weeks and <= 3 drinks/occasion for females) and that the risk for alcohol disorders and other health effects rises proportionally with the number of drinks per week and how often a drinker exceeds daily limits.  Discussed cessation/primary prevention of drug use and availability of treatment for abuse.   Diet: Encouraged to adjust caloric intake to maintain  or achieve ideal body weight, to reduce intake of dietary saturated fat and total fat, to limit sodium intake by avoiding high sodium foods and not adding table salt, and to maintain adequate dietary potassium and calcium preferably from fresh fruits, vegetables, and low-fat dairy products.    Stressed the importance of regular exercise  Injury prevention: Discussed safety belts, safety helmets, smoke detector, smoking near bedding or upholstery.   Dental health: Discussed importance of regular tooth brushing, flossing, and dental visits.    NEXT PREVENTATIVE PHYSICAL DUE IN 1 YEAR. Return in about 6 months (around 01/26/2024) for HTN/HLD, MOOD.

## 2023-07-30 ENCOUNTER — Encounter: Payer: Self-pay | Admitting: Nurse Practitioner

## 2023-07-30 LAB — LIPID PANEL W/O CHOL/HDL RATIO
Cholesterol, Total: 230 mg/dL — ABNORMAL HIGH (ref 100–199)
HDL: 59 mg/dL (ref >39)
LDL Chol Calc (NIH): 128 mg/dL — ABNORMAL HIGH (ref 0–99)
Triglycerides: 247 mg/dL — ABNORMAL HIGH (ref 0–149)
VLDL Cholesterol Cal: 43 mg/dL — ABNORMAL HIGH (ref 5–40)

## 2023-07-30 LAB — CBC WITH DIFFERENTIAL/PLATELET
Basophils Absolute: 0.1 10*3/uL (ref 0.0–0.2)
Basos: 1 %
EOS (ABSOLUTE): 0.2 10*3/uL (ref 0.0–0.4)
Eos: 3 %
Hematocrit: 39.8 % (ref 34.0–46.6)
Hemoglobin: 13.7 g/dL (ref 11.1–15.9)
Immature Grans (Abs): 0 10*3/uL (ref 0.0–0.1)
Immature Granulocytes: 1 %
Lymphocytes Absolute: 2 10*3/uL (ref 0.7–3.1)
Lymphs: 24 %
MCH: 32.5 pg (ref 26.6–33.0)
MCHC: 34.4 g/dL (ref 31.5–35.7)
MCV: 94 fL (ref 79–97)
Monocytes Absolute: 0.6 10*3/uL (ref 0.1–0.9)
Monocytes: 7 %
Neutrophils Absolute: 5.3 10*3/uL (ref 1.4–7.0)
Neutrophils: 64 %
Platelets: 384 10*3/uL (ref 150–450)
RBC: 4.22 x10E6/uL (ref 3.77–5.28)
RDW: 12.8 % (ref 11.7–15.4)
WBC: 8.2 10*3/uL (ref 3.4–10.8)

## 2023-07-30 LAB — COMPREHENSIVE METABOLIC PANEL
ALT: 24 [IU]/L (ref 0–32)
AST: 19 [IU]/L (ref 0–40)
Albumin: 4.5 g/dL (ref 3.9–4.9)
Alkaline Phosphatase: 90 [IU]/L (ref 44–121)
BUN/Creatinine Ratio: 16 (ref 9–23)
BUN: 11 mg/dL (ref 6–24)
Bilirubin Total: <0.2 mg/dL (ref 0.0–1.2)
CO2: 22 mmol/L (ref 20–29)
Calcium: 9.1 mg/dL (ref 8.7–10.2)
Chloride: 99 mmol/L (ref 96–106)
Creatinine, Ser: 0.7 mg/dL (ref 0.57–1.00)
Globulin, Total: 2.3 g/dL (ref 1.5–4.5)
Glucose: 85 mg/dL (ref 70–99)
Potassium: 4.6 mmol/L (ref 3.5–5.2)
Sodium: 137 mmol/L (ref 134–144)
Total Protein: 6.8 g/dL (ref 6.0–8.5)
eGFR: 109 mL/min/{1.73_m2} (ref >59)

## 2023-07-30 LAB — VITAMIN D 25 HYDROXY (VIT D DEFICIENCY, FRACTURES): Vit D, 25-Hydroxy: 47.4 ng/mL (ref 30.0–100.0)

## 2023-07-30 LAB — TSH: TSH: 1.09 u[IU]/mL (ref 0.450–4.500)

## 2023-07-30 MED ORDER — TIZANIDINE HCL 4 MG PO CAPS: 4.0000 mg | ORAL_CAPSULE | Freq: Three times a day (TID) | ORAL | 4 refills | Status: AC

## 2023-07-30 MED ORDER — ESZOPICLONE 3 MG PO TABS: 3.0000 mg | ORAL_TABLET | Freq: Every evening | ORAL | 2 refills | Status: DC | PRN

## 2023-07-30 MED ORDER — VITAMIN D3 1.25 MG (50000 UT) PO CAPS: 1.0000 | ORAL_CAPSULE | ORAL | 4 refills | Status: AC

## 2023-07-30 MED ORDER — NICOTINE 21 MG/24HR TD PT24
21.0000 mg | MEDICATED_PATCH | Freq: Every day | TRANSDERMAL | 0 refills | Status: AC
Start: 2023-07-30 — End: ?

## 2023-07-30 MED ORDER — DULOXETINE HCL 30 MG PO CPEP: 30.0000 mg | ORAL_CAPSULE | Freq: Two times a day (BID) | ORAL | 4 refills | Status: AC

## 2023-07-30 MED ORDER — LOSARTAN POTASSIUM 50 MG PO TABS: 100.0000 mg | ORAL_TABLET | Freq: Every day | ORAL | 4 refills | Status: AC

## 2023-07-30 NOTE — Assessment & Plan Note (Signed)
Refer to chronic opioid plan of care. 

## 2023-07-30 NOTE — Assessment & Plan Note (Signed)
 Will trial nicotine patches.  Avoid Chantix and Wellbutrin due to possible interactions with other medications.

## 2023-07-30 NOTE — Assessment & Plan Note (Signed)
 Chronic, ongoing.  Denies SI/HI.  Continue Duloxetine, which is beneficial to both pain and mood.  Refills sent.  Will add on Buspar 5 MG BID due to increased anxiety, educated her on this medication.  Monitor use with Amitriptyline on board.  Would avoid Wellbutrin, which would benefit smoking cessation and mood, as could reduce seizure threshold taken with Amitriptyline.

## 2023-07-30 NOTE — Assessment & Plan Note (Signed)
 Noted past labs.  ASCVD 4%, LDL <190.  At this time continue diet and exercise focus.  Recheck lipid panel today.

## 2023-07-30 NOTE — Progress Notes (Signed)
 Contacted via MyChart   Good morning Jenet, your labs have returned and overall are stable with exception of lipid panel.  Your cholesterol is still high, but continued recommendations to make lifestyle changes. Your LDL is above normal. The LDL is the bad cholesterol. Over time and in combination with inflammation and other factors, this contributes to plaque which in turn may lead to stroke and/or heart attack down the road. Sometimes high LDL is primarily genetic, and people might be eating all the right foods but still have high numbers. Other times, there is room for improvement in one's diet and eating healthier can bring this number down and potentially reduce one's risk of heart attack and/or stroke. To reduce your LDL, Remember - more fruits and vegetables, more fish, and limit red meat and dairy products. More soy, nuts, beans, barley, lentils, oats and plant sterol ester enriched margarine instead of butter. I also encourage eliminating sugar and processed food. Remember, shop on the outside of the grocery store and visit your [employer].  Keep being amazing!!  Thank you for allowing me to participate in your care.  I appreciate you. Kindest regards, Andreea Arca

## 2023-07-30 NOTE — Assessment & Plan Note (Signed)
 Chronic, stable.  BP at goal in office today.  Recommend she monitor BP at least a few mornings a week at home and document.  DASH diet at home.  Continue current medication regimen and adjust as needed.  LABS: CBC, CMP, TSH.  Refills up to date.

## 2023-07-30 NOTE — Assessment & Plan Note (Signed)
Chronic, stable with minimal Albuterol use.  Recommend complete cessation of smoking.  Notify provider if increased Albuterol use.  CBC today. 

## 2023-07-30 NOTE — Assessment & Plan Note (Signed)
Chronic, ongoing.  She is followed by pain management in [place] at this time, continue this collaboration.  They are refilling all pain related medications.

## 2023-07-30 NOTE — Assessment & Plan Note (Signed)
Chronic, ongoing.  Check today and continue supplement as needed.

## 2023-07-30 NOTE — Assessment & Plan Note (Signed)
Chronic, ongoing.  Continue Lunesta as needed only and Amitriptyline at HS for sleep.  Drug screen up to date and contract on file.

## 2023-07-30 NOTE — Assessment & Plan Note (Signed)
 BMI 41.79.  Recommended eating smaller high protein, low fat meals more frequently and exercising 30 mins a day 5 times a week with a goal of 10-15lb weight loss in the next 3 months. Patient voiced their understanding and motivation to adhere to these recommendations.

## 2023-08-01 ENCOUNTER — Other Ambulatory Visit: Payer: Self-pay | Admitting: Nurse Practitioner

## 2023-08-01 DIAGNOSIS — Z0000 Encounter for general adult medical examination without abnormal findings: Secondary | ICD-10-CM

## 2023-08-04 LAB — CYTOLOGY - PAP
Comment: NEGATIVE
Diagnosis: NEGATIVE
High risk HPV: NEGATIVE

## 2023-08-04 NOTE — Progress Notes (Signed)
 Contacted via MyChart   Good morning Holly French, your pap returned and all is negative.  Great news!! Repeat in 5 years.

## 2023-08-05 ENCOUNTER — Ambulatory Visit
Admission: RE | Admit: 2023-08-05 | Discharge: 2023-08-05 | Disposition: A | Discharge status: Home / Self Care | Payer: Commercial Managed Care - PPO | Source: Ambulatory Visit | Attending: Nurse Practitioner | Admitting: Nurse Practitioner

## 2023-08-05 ENCOUNTER — Encounter: Payer: Self-pay | Admitting: Nurse Practitioner

## 2023-08-05 DIAGNOSIS — Z1231 Encounter for screening mammogram for malignant neoplasm of breast: Secondary | ICD-10-CM | POA: Diagnosis not present

## 2023-08-05 DIAGNOSIS — Z0000 Encounter for general adult medical examination without abnormal findings: Secondary | ICD-10-CM

## 2023-08-09 ENCOUNTER — Encounter: Payer: Self-pay | Admitting: Nurse Practitioner

## 2023-08-09 NOTE — Progress Notes (Signed)
 Contacted via MyChart   Normal mammogram, may repeat in one year:)

## 2023-08-24 DIAGNOSIS — G894 Chronic pain syndrome: Secondary | ICD-10-CM | POA: Diagnosis not present

## 2023-08-24 DIAGNOSIS — M5416 Radiculopathy, lumbar region: Secondary | ICD-10-CM | POA: Diagnosis not present

## 2023-08-29 DIAGNOSIS — M5416 Radiculopathy, lumbar region: Secondary | ICD-10-CM | POA: Diagnosis not present

## 2023-09-02 ENCOUNTER — Ambulatory Visit

## 2023-09-02 VITALS — Ht 62.0 in | Wt 225.0 lb

## 2023-09-02 DIAGNOSIS — Z1211 Encounter for screening for malignant neoplasm of colon: Secondary | ICD-10-CM

## 2023-09-02 MED ORDER — SUTAB 1479-225-188 MG PO TABS: 12.0000 | ORAL_TABLET | ORAL | 0 refills | Status: DC

## 2023-09-02 NOTE — Progress Notes (Signed)
 No egg or soy allergy known to patient  No issues known to pt with past sedation with any surgeries or procedures Patient denies ever being told they had issues or difficulty with intubation  No FH of Malignant Hyperthermia Pt is not on diet pills Pt is not on  home 02  Pt is not on blood thinners  Pt denies issues with constipation  No A fib or A flutter Have any cardiac testing pending-- no  LOA: independent  Prep: sutab  Patient's chart reviewed by Cathlyn Parsons CNRA prior to previsit and patient appropriate for the [facility].  Previsit completed and red dot placed by patient's name on their procedure day (on provider's schedule).     PV completed with patient. Prep instructions sent via mychart and home address.

## 2023-09-02 NOTE — Patient Instructions (Signed)
 [facility] has implemented a new process for scheduling procedures.  Please note your arrival time for the [facility] is your appointment time that is shown on your written instructions.  Please do not arrive one hour prior to the time listed in your instructions.  Please ignore any outside notifications to arrive one hour early.  We apologize for any confusion and look forward to seeing you for your procedure.

## 2023-09-07 ENCOUNTER — Encounter: Payer: Self-pay | Admitting: Nurse Practitioner

## 2023-09-08 MED ORDER — BUSPIRONE HCL 10 MG PO TABS: 10.0000 mg | ORAL_TABLET | Freq: Two times a day (BID) | ORAL | 3 refills | Status: DC

## 2023-09-27 ENCOUNTER — Encounter: Payer: Self-pay | Admitting: Internal Medicine

## 2023-09-28 ENCOUNTER — Encounter: Payer: Self-pay | Admitting: Internal Medicine

## 2023-09-28 ENCOUNTER — Ambulatory Visit (AMBULATORY_SURGERY_CENTER): Payer: Self-pay | Admitting: Internal Medicine

## 2023-09-28 VITALS — BP 138/87 | HR 96 | Temp 99.3°F | Resp 19 | Ht 62.0 in | Wt 225.0 lb

## 2023-09-28 DIAGNOSIS — K5730 Diverticulosis of large intestine without perforation or abscess without bleeding: Secondary | ICD-10-CM | POA: Diagnosis not present

## 2023-09-28 DIAGNOSIS — D124 Benign neoplasm of descending colon: Secondary | ICD-10-CM

## 2023-09-28 DIAGNOSIS — K635 Polyp of colon: Secondary | ICD-10-CM | POA: Diagnosis not present

## 2023-09-28 DIAGNOSIS — Z1211 Encounter for screening for malignant neoplasm of colon: Secondary | ICD-10-CM | POA: Diagnosis not present

## 2023-09-28 DIAGNOSIS — D125 Benign neoplasm of sigmoid colon: Secondary | ICD-10-CM

## 2023-09-28 MED ORDER — SODIUM CHLORIDE 0.9 % IV SOLN: 500.0000 mL | INTRAVENOUS | Status: DC

## 2023-09-28 NOTE — Progress Notes (Signed)
 Called to room to assist during endoscopic procedure.  Patient ID and intended procedure confirmed with present staff. Received instructions for my participation in the procedure from the performing physician.

## 2023-09-28 NOTE — Op Note (Addendum)
 [facility] Patient Name: Holly French Procedure Date: 09/28/2023 8:31 AM MRN: [id number] Endoscopist: Wilhemina Bonito. Marina Goodell , MD, [id number] Age: 48 Referring MD:  Date of Birth: [birth year] Gender: Female Account #: [id number] Procedure:                Colonoscopy with cold snare polypectomy x 1; biopsy                            polypectomy x 1 Indications:              Screening for colorectal malignant neoplasm Medicines:                Monitored Anesthesia Care Procedure:                Pre-Anesthesia Assessment:                           - Prior to the procedure, a History and Physical                            was performed, and patient medications and                            allergies were reviewed. The patient's tolerance of                            previous anesthesia was also reviewed. The risks                            and benefits of the procedure and the sedation                            options and risks were discussed with the patient.                            All questions were answered, and informed consent                            was obtained. Prior Anticoagulants: The patient has                            taken no anticoagulant or antiplatelet agents. ASA                            Grade Assessment: II - A patient with mild systemic                            disease. After reviewing the risks and benefits,                            the patient was deemed in satisfactory condition to                            undergo the procedure.  After obtaining informed consent, the colonoscope                            was passed under direct vision. Throughout the                            procedure, the patient's blood pressure, pulse, and                            oxygen saturations were monitored continuously. The                            CF [id number] #[id number] was introduced through the anus                            and  advanced to the the cecum, identified by                            appendiceal orifice and ileocecal valve. The                            ileocecal valve, appendiceal orifice, and rectum                            were photographed. The quality of the bowel                            preparation was excellent. The colonoscopy was                            performed without difficulty. The patient tolerated                            the procedure well. The bowel preparation used was                            SUTAB via split dose instruction. Scope In: 8:46:12 AM Scope Out: 9:05:29 AM Scope Withdrawal Time: 0 hours 13 minutes 40 seconds  Total Procedure Duration: 0 hours 19 minutes 17 seconds  Findings:                 A 4 mm polyp was found in the descending colon. The                            polyp was removed with a cold snare. Resection and                            retrieval were complete.                           A 1 mm polyp was found in the sigmoid colon. The                            polyp was removed  with a jumbo cold forceps.                            Resection and retrieval were complete.                           A few diverticula were found in the left colon.                           The exam was otherwise without abnormality on                            direct and retroflexion views. Complications:            No immediate complications. Estimated blood loss:                            None. Estimated Blood Loss:     Estimated blood loss: none. Impression:               - One 4 mm polyp in the descending colon, removed                            with a cold snare. Resected and retrieved.                           - One 1 mm polyp in the sigmoid colon, removed with                            a jumbo cold forceps. Resected and retrieved.                           - Diverticulosis in the left colon.                           - The examination was otherwise normal on  direct                            and retroflexion views. Recommendation:           - Repeat colonoscopy in 7-10 years for surveillance.                           - Patient has a contact number available for                            emergencies. The signs and symptoms of potential                            delayed complications were discussed with the                            patient. Return to normal activities tomorrow.  Written discharge instructions were provided to the                            patient.                           - Resume previous diet.                           - Continue present medications.                           - Await pathology results. Murel [place]. Elvin Hammer, MD 09/28/2023 9:20:21 AM This report has been signed electronically.

## 2023-09-28 NOTE — Progress Notes (Signed)
 Pt's states no medical or surgical changes since previsit or office visit.

## 2023-09-28 NOTE — Progress Notes (Signed)
 HISTORY OF PRESENT ILLNESS:  Holly French is a 48 y.o. female is sent today for routine screening colonoscopy.  No complaints  REVIEW OF SYSTEMS:  All non-GI ROS negative except for  Past Medical History:  Diagnosis Date   Allergy    Asthma    Chronic urticaria    Dermatitis    Eczema    Hayfever    Hypertension    Vitamin D deficiency     Past Surgical History:  Procedure Laterality Date   ABDOMINAL HYSTERECTOMY     EPF and gastroc recession Right 01/14/2016   Per MD notes   laparoscopies     x3   NASAL SINUS SURGERY     x3   PLANTAR FASCIA RELEASE     SPINAL FUSION     lumbar    Social History KALIOPE QUINONEZ  reports that she has been smoking cigarettes. She has a 9 pack-year smoking history. She has never used smokeless tobacco. She reports that she does not drink alcohol and does not use drugs.  family history includes Allergies in her daughter; COPD in her maternal grandmother; Cancer in her maternal grandfather; Colon cancer in her maternal grandfather; Diabetes in her mother; Early death in her mother; Hyperlipidemia in her father; Lupus in her maternal grandmother.  Allergies  Allergen Reactions   Covid-19 (Mrna) Vaccine Proofreader) [Covid-19 (Mrna) Vaccine] Rash   Neosporin [Neomycin-Bacitracin Zn-Polymyx] Dermatitis   Latex Rash       PHYSICAL EXAMINATION: Vital signs: BP (!) 143/103   Pulse (!) 106   Temp 99.3 F (37.4 C)   Resp 16   Ht 5\' 2"  (1.575 m)   Wt 225 lb (102.1 kg)   LMP 02/25/2009 (Approximate)   SpO2 98%   BMI 41.15 kg/m  General: Well-developed, well-nourished, no acute distress HEENT: Sclerae are anicteric, conjunctiva pink. Oral mucosa intact Lungs: Clear Heart: Regular Abdomen: soft, nontender, nondistended, no obvious ascites, no peritoneal signs, normal bowel sounds. No organomegaly. Extremities: No edema Psychiatric: alert and oriented x3. Cooperative     ASSESSMENT:  Colon cancer screening   PLAN:  Screening  colonoscopy

## 2023-09-28 NOTE — Patient Instructions (Signed)

## 2023-09-28 NOTE — Progress Notes (Signed)
 Report to PACU, RN, vss, BBS= Clear.

## 2023-09-29 ENCOUNTER — Telehealth: Payer: Self-pay | Admitting: *Deleted

## 2023-09-29 NOTE — Telephone Encounter (Signed)
  Follow up Call-     09/28/2023    7:28 AM  Call back number  Post procedure Call Back phone  # [phone]  Permission to leave phone message Yes     Patient questions:  Do you have a fever, pain , or abdominal swelling? No. Pain Score  0 *  Have you tolerated food without any problems? Yes.    Have you been able to return to your normal activities? Yes.    Do you have any questions about your discharge instructions: Diet   No. Medications  No. Follow up visit  No.  Do you have questions or concerns about your Care? No.  Actions: * If pain score is 4 or above: No action needed, pain <4.

## 2023-09-30 ENCOUNTER — Encounter: Payer: Self-pay | Admitting: Internal Medicine

## 2023-09-30 LAB — SURGICAL PATHOLOGY

## 2023-10-09 NOTE — Patient Instructions (Signed)

## 2023-10-11 ENCOUNTER — Encounter: Payer: Self-pay | Admitting: Nurse Practitioner

## 2023-10-11 ENCOUNTER — Ambulatory Visit (INDEPENDENT_AMBULATORY_CARE_PROVIDER_SITE_OTHER): Admitting: Nurse Practitioner

## 2023-10-11 VITALS — BP 132/83 | HR 93 | Temp 97.6°F | Wt 228.8 lb

## 2023-10-11 DIAGNOSIS — J0100 Acute maxillary sinusitis, unspecified: Secondary | ICD-10-CM | POA: Diagnosis not present

## 2023-10-11 DIAGNOSIS — Z111 Encounter for screening for respiratory tuberculosis: Secondary | ICD-10-CM | POA: Diagnosis not present

## 2023-10-11 MED ORDER — AMOXICILLIN-POT CLAVULANATE 875-125 MG PO TABS: 1.0000 | ORAL_TABLET | Freq: Two times a day (BID) | ORAL | 0 refills | Status: AC

## 2023-10-11 MED ORDER — PREDNISONE 20 MG PO TABS: 40.0000 mg | ORAL_TABLET | Freq: Every day | ORAL | 0 refills | Status: AC

## 2023-10-11 NOTE — Progress Notes (Signed)
 BP 132/83   Pulse 93   Temp 97.6 F (36.4 C) (Oral)   Wt 228 lb 12.8 oz (103.8 kg)   LMP 02/25/2009 (Approximate)   SpO2 96%   BMI 41.85 kg/m    Subjective:    Patient ID: Holly French, female    DOB: 07-29-1975, 48 y.o.   MRN: [id number]  HPI: Holly French is a 48 y.o. female  Chief Complaint  Patient presents with   Allergies   UPPER RESPIRATORY TRACT INFECTION Started 1 1/2 weeks ago with allergy type symptoms and now has progressed into sinus issues. Became worse over the weekend. Fever: no Cough: yes Shortness of breath: yes Wheezing: yes Chest pain: no Chest tightness: yes Chest congestion: yes Nasal congestion: yes Runny nose: no Post nasal drip: no Sneezing: no Sore throat: no Swollen glands: no Sinus pressure: yes Headache: yes Face pain: yes Toothache: no Ear pain: none Ear pressure: none Eyes red/itching:yes Eye drainage/crusting: no  Vomiting: no Rash: no Fatigue: yes Sick contacts: no Strep contacts: no  Context: worse Recurrent sinusitis: no Relief with OTC cold/cough medications: no Treatments attempted: Zyrtec, Flonase , and Albuterol  + sinus medication    Relevant past medical, surgical, family and social history reviewed and updated as indicated. Interim medical history since our last visit reviewed. Allergies and medications reviewed and updated.  Review of Systems  Constitutional:  Positive for fatigue. Negative for activity change, appetite change, chills and fever.  HENT:  Positive for congestion, sinus pressure and sinus pain. Negative for ear discharge, ear pain, facial swelling, postnasal drip, rhinorrhea, sneezing, sore throat and voice change.   Eyes:  Negative for pain and visual disturbance.  Respiratory:  Positive for cough, chest tightness, shortness of breath and wheezing.   Cardiovascular:  Negative for chest pain, palpitations and leg swelling.  Gastrointestinal: Negative.   Endocrine: Negative.   Neurological:   Positive for headaches. Negative for dizziness and numbness.  Psychiatric/Behavioral: Negative.      Per HPI unless specifically indicated above     Objective:    BP 132/83   Pulse 93   Temp 97.6 F (36.4 C) (Oral)   Wt 228 lb 12.8 oz (103.8 kg)   LMP 02/25/2009 (Approximate)   SpO2 96%   BMI 41.85 kg/m   Wt Readings from Last 3 Encounters:  10/11/23 228 lb 12.8 oz (103.8 kg)  09/28/23 225 lb (102.1 kg)  09/02/23 225 lb (102.1 kg)    Physical Exam Vitals and nursing note reviewed.  Constitutional:      General: She is awake. She is not in acute distress.    Appearance: She is well-developed and well-groomed. She is obese. She is ill-appearing. She is not toxic-appearing.  HENT:     Head: Normocephalic.     Right Ear: Hearing, ear canal and external ear normal. A middle ear effusion is present. Tympanic membrane is not injected or perforated.     Left Ear: Hearing, ear canal and external ear normal. A middle ear effusion is present. Tympanic membrane is not injected or perforated.     Nose: Rhinorrhea present. Rhinorrhea is clear.     Right Sinus: Maxillary sinus tenderness present. No frontal sinus tenderness.     Left Sinus: Maxillary sinus tenderness present. No frontal sinus tenderness.     Mouth/Throat:     Mouth: Mucous membranes are moist.     Pharynx: Posterior oropharyngeal erythema (mild) present. No pharyngeal swelling or oropharyngeal exudate.  Eyes:  General: Lids are normal.        Right eye: No discharge.        Left eye: No discharge.     Conjunctiva/sclera: Conjunctivae normal.     Pupils: Pupils are equal, round, and reactive to light.  Neck:     Thyroid: No thyromegaly.     Vascular: No carotid bruit.  Cardiovascular:     Rate and Rhythm: Normal rate and regular rhythm.     Heart sounds: Normal heart sounds. No murmur heard.    No gallop.  Pulmonary:     Effort: Pulmonary effort is normal. No accessory muscle usage or respiratory distress.      Breath sounds: Wheezing present. No decreased breath sounds, rhonchi or rales.     Comments: Expiratory wheezes noted throughout. Abdominal:     General: Bowel sounds are normal.     Palpations: Abdomen is soft. There is no hepatomegaly or splenomegaly.  Musculoskeletal:     Cervical back: Normal range of motion and neck supple.     Right lower leg: No edema.     Left lower leg: No edema.  Lymphadenopathy:     Head:     Right side of head: No submental, submandibular, tonsillar, preauricular or posterior auricular adenopathy.     Left side of head: No submental, submandibular, tonsillar, preauricular or posterior auricular adenopathy.     Cervical: No cervical adenopathy.  Skin:    General: Skin is warm and dry.  Neurological:     Mental Status: She is alert and oriented to person, place, and time.  Psychiatric:        Attention and Perception: Attention normal.        Mood and Affect: Mood normal.        Speech: Speech normal.        Behavior: Behavior normal. Behavior is cooperative.        Thought Content: Thought content normal.     Results for orders placed or performed in visit on 09/28/23  Surgical pathology ([facility])   Collection Time: 09/28/23 12:00 AM  Result Value Ref Range   SURGICAL PATHOLOGY      SURGICAL PATHOLOGY [facility] [address], [address] [place], [place] [address] Telephone [phone] or [phone] Fax [phone]  REPORT OF SURGICAL PATHOLOGY   Accession #: [id number] Patient Name: Holly French Visit # : [id number]  MRN: [id number] Physician: Legrand Puma DOB/Age [birth year] (Age: 50) Gender: F Collected Date: 09/28/2023 Received Date: 09/29/2023  FINAL DIAGNOSIS       1. Surgical [P], colon, descending, polyp (1) :       - TUBULAR ADENOMA.      - NO HIGH GRADE DYSPLASIA OR MALIGNANCY.       2. Surgical [P], colon, sigmoid, polyp (1) :       - HYPERPLASTIC POLYP.      - NO DYSPLASIA OR  MALIGNANCY.       ELECTRONIC SIGNATURE : Holly Jacobs M.D., Nupur, Pathologist, Electronic Signature  MICROSCOPIC DESCRIPTION  CASE COMMENTS STAINS USED IN DIAGNOSIS: H&E H&E    CLINICAL HISTORY  SPECIMEN(S) OBTAINED 1. Surgical [P], Colon, Descending, Polyp (1) 2. Surgical [P], Colon, Sigmoid, Polyp (1)  SPECIME N COMMENTS: 1. Special screening for malignant neoplasms, colon; benign neoplasm of descending colon; benign neoplasm of sigmoid colon SPECIMEN CLINICAL INFORMATION: 1. R/O adenoma 2. R/O adenoma    Gross Description 1. Received in formalin are tan, soft tissue fragments that are submitted  in toto.Number: 1 Size: 0.6 cm, (1B) ( TA ) 2. Received in formalin are tan, soft tissue fragments that are submitted in toto.Number: 1 Size: 0.8 cm, (1B) ( TA )        Report signed out from the following location(s) [place] [address] [place], [place] [address] CLIA #: [id number]  [facility] [address] [place], [place] [address] CLIA #: [id number]       Assessment & Plan:   Problem List Items Addressed This Visit       Respiratory   Acute non-recurrent maxillary sinusitis - Primary   Acute for 1 1/2 weeks.  At this time will start Augmentin  BID for 7 days and Prednisone  40 MG daily for 5 days.  Recommend: - Increased rest - Increasing Fluids - Acetaminophen  as needed for fever/pain.  - Salt water gargling, chloraseptic spray and throat lozenges - Mucinex.  - Humidifying the air.       Relevant Medications   amoxicillin -clavulanate (AUGMENTIN ) 875-125 MG tablet   predniSONE  (DELTASONE ) 20 MG tablet   Other Visit Diagnoses       Screening-pulmonary TB       TB testing needed for her new job and obtained today.   Relevant Orders   QuantiFERON-TB Gold Plus        Follow up plan: Return if symptoms worsen or fail to improve.

## 2023-10-11 NOTE — Assessment & Plan Note (Signed)
 Acute for 1 1/2 weeks.  At this time will start Augmentin  BID for 7 days and Prednisone  40 MG daily for 5 days.  Recommend: - Increased rest - Increasing Fluids - Acetaminophen  as needed for fever/pain.  - Salt water gargling, chloraseptic spray and throat lozenges - Mucinex.  - Humidifying the air.

## 2023-10-14 ENCOUNTER — Encounter: Payer: Self-pay | Admitting: Nurse Practitioner

## 2023-10-14 LAB — QUANTIFERON-TB GOLD PLUS
QuantiFERON Mitogen Value: >10 [IU]/mL
QuantiFERON Nil Value: 0.04 [IU]/mL
QuantiFERON TB1 Ag Value: 0.04 [IU]/mL
QuantiFERON TB2 Ag Value: 0.05 [IU]/mL
QuantiFERON-TB Gold Plus: NEGATIVE

## 2023-10-15 ENCOUNTER — Other Ambulatory Visit: Payer: Self-pay | Admitting: Nurse Practitioner

## 2023-10-15 DIAGNOSIS — F1120 Opioid dependence, uncomplicated: Secondary | ICD-10-CM

## 2023-10-16 ENCOUNTER — Other Ambulatory Visit: Payer: Self-pay | Admitting: Nurse Practitioner

## 2023-10-16 DIAGNOSIS — J029 Acute pharyngitis, unspecified: Secondary | ICD-10-CM

## 2023-10-16 DIAGNOSIS — R059 Cough, unspecified: Secondary | ICD-10-CM

## 2023-10-16 DIAGNOSIS — R0989 Other specified symptoms and signs involving the circulatory and respiratory systems: Secondary | ICD-10-CM

## 2023-10-18 NOTE — Telephone Encounter (Signed)
 Requested Prescriptions  Pending Prescriptions Disp Refills   albuterol  (VENTOLIN  HFA) 108 (90 Base) MCG/ACT inhaler [Pharmacy Med Name: ALBUTEROL  HFA 90 MCG INHALER] 18 g 0    Sig: INHALE 2 PUFFS BY MOUTH EVERY 6 HOURS AS NEEDED FOR WHEEZING OR SHORTNESS OF BREATH     Pulmonology:  Beta Agonists 2 Passed - 10/18/2023  2:50 PM      Passed - Last BP in normal range    BP Readings from Last 1 Encounters:  10/11/23 132/83         Passed - Last Heart Rate in normal range    Pulse Readings from Last 1 Encounters:  10/11/23 93         Passed - Valid encounter within last 12 months    Recent Outpatient Visits           1 week ago Acute non-recurrent maxillary sinusitis   [place] [facility] [place], Sanjuan Crumbly T, NP   2 months ago Continuous opioid dependence (HCC)   [place] [facility] [place], Lavelle Posey, NP       Future Appointments             In 3 months Cannady, Jolene T, NP [place] [facility], [facility]

## 2023-10-18 NOTE — Telephone Encounter (Signed)
 Requested Prescriptions  Pending Prescriptions Disp Refills   amitriptyline  (ELAVIL ) 50 MG tablet [Pharmacy Med Name: AMITRIPTYLINE  HCL 50 MG TAB] 90 tablet 0    Sig: TAKE 1 TABLET BY MOUTH AT BEDTIME     Psychiatry:  Antidepressants - Heterocyclics (TCAs) Passed - 10/18/2023 10:03 AM      Passed - Valid encounter within last 6 months    Recent Outpatient Visits           1 week ago Acute non-recurrent maxillary sinusitis   [place] [facility] [place], Sanjuan Crumbly T, NP   2 months ago Continuous opioid dependence [facility])   [place] [facility] [place], Lavelle Posey, NP       Future Appointments             In 3 months Cannady, Jolene T, NP [facility], [facility]

## 2023-10-25 DIAGNOSIS — G894 Chronic pain syndrome: Secondary | ICD-10-CM | POA: Diagnosis not present

## 2023-10-25 DIAGNOSIS — Z1330 Encounter for screening examination for mental health and behavioral disorders, unspecified: Secondary | ICD-10-CM | POA: Diagnosis not present

## 2023-11-01 ENCOUNTER — Other Ambulatory Visit: Payer: Self-pay | Admitting: Nurse Practitioner

## 2023-11-03 NOTE — Telephone Encounter (Signed)
 Requested medication (s) are due for refill today: expired medication date   Requested medication (s) are on the active medication list: yes   Last refill:  06/01/22 #60 g 4 refills  Future visit scheduled: yes in 2 months  Notes to clinic:  not delegated per protocol. Do you want to renew / refill Rx?     Requested Prescriptions  Pending Prescriptions Disp Refills   triamcinolone  ointment (KENALOG ) 0.5 % [Pharmacy Med Name: TRIAMCINOLONE  0.5% OINTMENT] 60 g 4    Sig: APPLY TO THE AFFECTED AREAS 2 TIMES A DAY     Not Delegated - Dermatology:  Corticosteroids Failed - 11/03/2023 11:12 AM      Failed - This refill cannot be delegated      Passed - Valid encounter within last 12 months    Recent Outpatient Visits           3 weeks ago Acute non-recurrent maxillary sinusitis   [place] [facility] [place], Sanjuan Crumbly T, NP   3 months ago Continuous opioid dependence (HCC)   [place] [facility] [place], Lavelle Posey, NP       Future Appointments             In 2 months Cannady, Jolene T, NP [facility], [facility]

## 2023-11-29 ENCOUNTER — Other Ambulatory Visit: Payer: Self-pay | Admitting: Nurse Practitioner

## 2023-12-01 NOTE — Telephone Encounter (Signed)
 Pt calling to check on status of rx refill request. Advised of turnaround time. Pt verbalized understanding.

## 2023-12-28 DIAGNOSIS — Z981 Arthrodesis status: Secondary | ICD-10-CM | POA: Diagnosis not present

## 2023-12-28 DIAGNOSIS — M545 Low back pain, unspecified: Secondary | ICD-10-CM | POA: Diagnosis not present

## 2023-12-28 DIAGNOSIS — G894 Chronic pain syndrome: Secondary | ICD-10-CM | POA: Diagnosis not present

## 2024-01-13 ENCOUNTER — Other Ambulatory Visit: Payer: Self-pay | Admitting: Nurse Practitioner

## 2024-01-13 DIAGNOSIS — F1120 Opioid dependence, uncomplicated: Secondary | ICD-10-CM

## 2024-01-13 NOTE — Telephone Encounter (Signed)
 Requested Prescriptions  Pending Prescriptions Disp Refills   busPIRone  (BUSPAR ) 10 MG tablet [Pharmacy Med Name: busPIRone  HCL 10 MG TABLET] 180 tablet 0    Sig: TAKE 1 TABLET BY MOUTH 2 TIMES A DAY     Psychiatry: Anxiolytics/Hypnotics - Non-controlled Passed - 01/13/2024  3:30 PM      Passed - Valid encounter within last 12 months    Recent Outpatient Visits           3 months ago Acute non-recurrent maxillary sinusitis   [place] [facility] [place], [place] T, NP   5 months ago Continuous opioid dependence (HCC)   [place] [facility] [place], Melanie DASEN, NP       Future Appointments             In 2 weeks Cannady, Jolene T, NP [place] [facility], [facility]             amitriptyline  (ELAVIL ) 50 MG tablet [Pharmacy Med Name: AMITRIPTYLINE  HCL 50 MG TAB] 90 tablet 0    Sig: TAKE 1 TABLET BY MOUTH AT BEDTIME     Psychiatry:  Antidepressants - Heterocyclics (TCAs) Passed - 01/13/2024  3:30 PM      Passed - Valid encounter within last 6 months    Recent Outpatient Visits           3 months ago Acute non-recurrent maxillary sinusitis   [place] [facility] [place], [place] T, NP   5 months ago Continuous opioid dependence (HCC)   [place] [facility] [place], Melanie DASEN, NP       Future Appointments             In 2 weeks Cannady, Jolene T, NP [facility] [place], [facility]

## 2024-01-21 NOTE — Patient Instructions (Signed)
 Be Involved in Caring For Your Health:  Taking Medications When medications are taken as directed, they can greatly improve your health. But if they are not taken as prescribed, they may not work. In some cases, not taking them correctly can be harmful. To help ensure your treatment remains effective and safe, understand your medications and how to take them. Bring your medications to each visit for review by your provider.  Your lab results, notes, and after visit summary will be available on My Chart. We strongly encourage you to use this feature. If lab results are abnormal the clinic will contact you with the appropriate steps. If the clinic does not contact you assume the results are satisfactory. You can always view your results on My Chart. If you have questions regarding your health or results, please contact the clinic during office hours. You can also ask questions on My Chart.  We at [facility] are grateful that you chose [place] to provide your care. We strive to provide evidence-based and compassionate care and are always looking for feedback. If you get a survey from the clinic please complete this so we can hear your opinions.  Healthy Eating, Adult Healthy eating may help you get and keep a healthy body weight, reduce the risk of chronic disease, and live a long and productive life. It is important to follow a healthy eating pattern. Your nutritional and calorie needs should be met mainly by different nutrient-rich foods. What are tips for following this plan? Reading food labels Read labels and choose the following: Reduced or low sodium products. Juices with 100% fruit juice. Foods with low saturated fats (<3 g per serving) and high polyunsaturated and monounsaturated fats. Foods with whole grains, such as whole wheat, cracked wheat, brown rice, and wild rice. Whole grains that are fortified with folic acid. This is recommended for females who are pregnant or who want  to become pregnant. Read labels and do not eat or drink the following: Foods or drinks with added sugars. These include foods that contain brown sugar, corn sweetener, corn syrup, dextrose, fructose, glucose, high-fructose corn syrup, honey, invert sugar, lactose, malt syrup, maltose, molasses, raw sugar, sucrose, trehalose, or turbinado sugar. Limit your intake of added sugars to less than 10% of your total daily calories. Do not eat more than the following amounts of added sugar per day: 6 teaspoons (25 g) for females. 9 teaspoons (38 g) for males. Foods that contain processed or refined starches and grains. Refined grain products, such as white flour, degermed cornmeal, white bread, and white rice. Shopping Choose nutrient-rich snacks, such as vegetables, whole fruits, and nuts. Avoid high-calorie and high-sugar snacks, such as potato chips, fruit snacks, and candy. Use oil-based dressings and spreads on foods instead of solid fats such as butter, margarine, sour cream, or cream cheese. Limit pre-made sauces, mixes, and "instant" products such as flavored rice, instant noodles, and ready-made pasta. Try more plant-protein sources, such as tofu, tempeh, black beans, edamame, lentils, nuts, and seeds. Explore eating plans such as the Mediterranean diet or vegetarian diet. Try heart-healthy dips made with beans and healthy fats like hummus and guacamole. Vegetables go great with these. Cooking Use oil to saut or stir-fry foods instead of solid fats such as butter, margarine, or lard. Try baking, boiling, grilling, or broiling instead of frying. Remove the fatty part of meats before cooking. Steam vegetables in water or broth. Meal planning  At meals, imagine dividing your plate into fourths: One-half of  your plate is fruits and vegetables. One-fourth of your plate is whole grains. One-fourth of your plate is protein, especially lean meats, poultry, eggs, tofu, beans, or nuts. Include  low-fat dairy as part of your daily diet. Lifestyle Choose healthy options in all settings, including home, work, school, restaurants, or stores. Prepare your food safely: Wash your hands after handling raw meats. Where you prepare food, keep surfaces clean by regularly washing with hot, soapy water. Keep raw meats separate from ready-to-eat foods, such as fruits and vegetables. Cook seafood, meat, poultry, and eggs to the recommended temperature. Get a food thermometer. Store foods at safe temperatures. In general: Keep cold foods at 76F (4.4C) or below. Keep hot foods at 176F (60C) or above. Keep your freezer at [facility] (-17.8C) or below. Foods are not safe to eat if they have been between the temperatures of 40-176F (4.4-60C) for more than 2 hours. What foods should I eat? Fruits Aim to eat 1-2 cups of fresh, canned (in natural juice), or frozen fruits each day. One cup of fruit equals 1 small apple, 1 large banana, 8 large strawberries, 1 cup (237 g) canned fruit,  cup (82 g) dried fruit, or 1 cup (240 mL) 100% juice. Vegetables Aim to eat 2-4 cups of fresh and frozen vegetables each day, including different varieties and colors. One cup of vegetables equals 1 cup (91 g) broccoli or cauliflower florets, 2 medium carrots, 2 cups (150 g) raw, leafy greens, 1 large tomato, 1 large bell pepper, 1 large sweet potato, or 1 medium white potato. Grains Aim to eat 5-10 ounce-equivalents of whole grains each day. Examples of 1 ounce-equivalent of grains include 1 slice of bread, 1 cup (40 g) ready-to-eat cereal, 3 cups (24 g) popcorn, or  cup (93 g) cooked rice. Meats and other proteins Try to eat 5-7 ounce-equivalents of protein each day. Examples of 1 ounce-equivalent of protein include 1 egg,  oz nuts (12 almonds, 24 pistachios, or 7 walnut halves), 1/4 cup (90 g) cooked beans, 6 tablespoons (90 g) hummus or 1 tablespoon (16 g) peanut butter. A cut of meat or fish that is the size of a deck  of cards is about 3-4 ounce-equivalents (85 g). Of the protein you eat each week, try to have at least 8 sounce (227 g) of seafood. This is about 2 servings per week. This includes salmon, trout, herring, sardines, and anchovies. Dairy Aim to eat 3 cup-equivalents of fat-free or low-fat dairy each day. Examples of 1 cup-equivalent of dairy include 1 cup (240 mL) milk, 8 ounces (250 g) yogurt, 1 ounces (44 g) natural cheese, or 1 cup (240 mL) fortified soy milk. Fats and oils Aim for about 5 teaspoons (21 g) of fats and oils per day. Choose monounsaturated fats, such as canola and olive oils, mayonnaise made with olive oil or avocado oil, avocados, peanut butter, and most nuts, or polyunsaturated fats, such as sunflower, corn, and soybean oils, walnuts, pine nuts, sesame seeds, sunflower seeds, and flaxseed. Beverages Aim for 6 eight-ounce glasses of water per day. Limit coffee to 3-5 eight-ounce cups per day. Limit caffeinated beverages that have added calories, such as soda and energy drinks. If you drink alcohol: Limit how much you have to: 0-1 drink a day if you are female. 0-2 drinks a day if you are female. Know how much alcohol is in your drink. In the [place] one drink is one 12 oz bottle of beer (355 mL), one 5 oz glass of wine (  148 mL), or one 1 oz glass of hard liquor (44 mL). Seasoning and other foods Try not to add too much salt to your food. Try using herbs and spices instead of salt. Try not to add sugar to food. This information is based on [place] nutrition guidelines. To learn more, visit DisposableNylon.be. Exact amounts may vary. You may need different amounts. This information is not intended to replace advice given to you by your health care provider. Make sure you discuss any questions you have with your health care provider. Document Revised: 03/01/2022 Document Reviewed: 03/01/2022 Elsevier Patient Education  2024 [employer].

## 2024-01-27 ENCOUNTER — Encounter: Payer: Self-pay | Admitting: Nurse Practitioner

## 2024-01-27 ENCOUNTER — Ambulatory Visit (INDEPENDENT_AMBULATORY_CARE_PROVIDER_SITE_OTHER): Payer: Commercial Managed Care - PPO | Admitting: Nurse Practitioner

## 2024-01-27 VITALS — BP 123/78 | HR 98 | Temp 98.4°F | Ht 62.0 in | Wt 231.2 lb

## 2024-01-27 DIAGNOSIS — F419 Anxiety disorder, unspecified: Secondary | ICD-10-CM | POA: Diagnosis not present

## 2024-01-27 DIAGNOSIS — F1120 Opioid dependence, uncomplicated: Secondary | ICD-10-CM | POA: Diagnosis not present

## 2024-01-27 DIAGNOSIS — Z6841 Body Mass Index (BMI) 40.0 and over, adult: Secondary | ICD-10-CM

## 2024-01-27 DIAGNOSIS — I10 Essential (primary) hypertension: Secondary | ICD-10-CM

## 2024-01-27 DIAGNOSIS — F17210 Nicotine dependence, cigarettes, uncomplicated: Secondary | ICD-10-CM

## 2024-01-27 DIAGNOSIS — F5104 Psychophysiologic insomnia: Secondary | ICD-10-CM | POA: Diagnosis not present

## 2024-01-27 DIAGNOSIS — E66813 Obesity, class 3: Secondary | ICD-10-CM

## 2024-01-27 NOTE — Progress Notes (Signed)
 BP 123/78   Pulse 98   Temp 98.4 F (36.9 C) (Oral)   Ht 5' 2 (1.575 m)   Wt 231 lb 3.2 oz (104.9 kg)   LMP 02/25/2009 (Approximate)   SpO2 97%   BMI 42.29 kg/m    Subjective:    Patient ID: Holly French, female    DOB: 1976/05/27, 48 y.o.   MRN: [id number]  HPI: Holly French is a 48 y.o. female  Chief Complaint  Patient presents with   Depression   Hyperlipidemia   Hypertension   HYPERTENSION Taking Losartan  100 MG.  Continues to smoke about 1/2 to 1 PPD, started smoking off and on since age 72.   Hypertension status: stable  Satisfied with current treatment? yes Duration of hypertension: chronic BP monitoring frequency: none BP range:  BP medication side effects:  no Medication compliance: good compliance Aspirin: no Recurrent headaches: no Visual changes: no Palpitations: no Dyspnea: no Chest pain: no Lower extremity edema: no Dizzy/lightheaded: no  The 10-year ASCVD risk score (Arnett DK, et al., 2019) is: 3.9%   Values used to calculate the score:     Age: 17 years     Clincally relevant sex: Female     Is Non-Hispanic African American: No     Diabetic: No     Tobacco smoker: Yes     Systolic Blood Pressure: 123 mmHg     Is BP treated: Yes     HDL Cholesterol: 59 mg/dL     Total Cholesterol: 230 mg/dL  ANXIETY Takes Duloxetine  for mood/pain, Buspar  BID, and Lunesta  only as needed for sleep + Amitriptyline . Continues to follow [facility] for pain management. History of lumbar fusion L5-S1 -- 2010. Continues Norco for pain. Mood status: stable Satisfied with current treatment?: yes Symptom severity: moderate  Duration of current treatment : chronic Side effects: no Medication compliance: good compliance Psychotherapy/counseling: none Depressed mood: no Anxious mood: yes -- due to work and her husband lost his job Anhedonia: no Significant weight loss or gain: no Insomnia: yes hard to fall asleep -- if has to go to work  next day takes Lunesta  Fatigue: no Feelings of worthlessness or guilt: no Impaired concentration/indecisiveness: no Suicidal ideations: no Hopelessness: no Crying spells: no    01/27/2024    2:09 PM 07/29/2023    2:03 PM 02/15/2023    4:11 PM 07/26/2022    3:40 PM 05/25/2022    3:52 PM  Depression screen PHQ 2/9  Decreased Interest 1 1 1  0 1  Down, Depressed, Hopeless 1 0 0 0 0  PHQ - 2 Score 2 1 1  0 1  Altered sleeping 2 2 1 1 1   Tired, decreased energy 3 2 2 2 1   Change in appetite 1 1 1  0 0  Feeling bad or failure about yourself  0 1 0 1 0  Trouble concentrating 0 0 0 0 1  Moving slowly or fidgety/restless 0 0 0 0 0  Suicidal thoughts 0 0 0 0 0  PHQ-9 Score 8 7 5 4 4   Difficult doing work/chores Somewhat difficult Not difficult at all Not difficult at all  Somewhat difficult       01/27/2024    2:09 PM 07/29/2023    2:03 PM 02/15/2023    4:11 PM 07/26/2022    3:40 PM  GAD 7 : Generalized Anxiety Score  Nervous, Anxious, on Edge 1 2 1 2   Control/stop worrying 1 1 1 1   Worry too  much - different things 2 1 1  0  Trouble relaxing 2 2 2 2   Restless 0 1 1 0  Easily annoyed or irritable 1 3 2 1   Afraid - awful might happen 0 1 1 0  Total GAD 7 Score 7 11 9 6   Anxiety Difficulty Somewhat difficult Somewhat difficult Somewhat difficult Not difficult at all    Relevant past medical, surgical, family and social history reviewed and updated as indicated. Interim medical history since our last visit reviewed. Allergies and medications reviewed and updated.  Review of Systems  Constitutional:  Negative for activity change, appetite change, diaphoresis, fatigue and fever.  Respiratory:  Negative for cough, chest tightness, shortness of breath and wheezing.   Cardiovascular:  Negative for chest pain, palpitations and leg swelling.  Gastrointestinal: Negative.   Neurological: Negative.   Psychiatric/Behavioral:  Positive for sleep disturbance. Negative for decreased concentration,  self-injury and suicidal ideas. The patient is nervous/anxious.     Per HPI unless specifically indicated above     Objective:    BP 123/78   Pulse 98   Temp 98.4 F (36.9 C) (Oral)   Ht 5' 2 (1.575 m)   Wt 231 lb 3.2 oz (104.9 kg)   LMP 02/25/2009 (Approximate)   SpO2 97%   BMI 42.29 kg/m   Wt Readings from Last 3 Encounters:  01/27/24 231 lb 3.2 oz (104.9 kg)  10/11/23 228 lb 12.8 oz (103.8 kg)  09/28/23 225 lb (102.1 kg)    Physical Exam Vitals and nursing note reviewed.  Constitutional:      General: She is awake. She is not in acute distress.    Appearance: She is well-developed and well-groomed. She is obese. She is not ill-appearing or toxic-appearing.  HENT:     Head: Normocephalic.     Right Ear: Hearing and external ear normal.     Left Ear: Hearing and external ear normal.  Eyes:     General: Lids are normal.        Right eye: No discharge.        Left eye: No discharge.     Conjunctiva/sclera: Conjunctivae normal.     Pupils: Pupils are equal, round, and reactive to light.  Neck:     Thyroid: No thyromegaly.     Vascular: No carotid bruit.  Cardiovascular:     Rate and Rhythm: Normal rate and regular rhythm.     Heart sounds: Normal heart sounds. No murmur heard.    No gallop.  Pulmonary:     Effort: Pulmonary effort is normal. No accessory muscle usage or respiratory distress.     Breath sounds: Normal breath sounds.  Abdominal:     General: Bowel sounds are normal. There is no distension.     Palpations: Abdomen is soft.     Tenderness: There is no abdominal tenderness.  Musculoskeletal:     Cervical back: Normal range of motion and neck supple.     Right lower leg: No edema.     Left lower leg: No edema.  Lymphadenopathy:     Cervical: No cervical adenopathy.  Skin:    General: Skin is warm and dry.  Neurological:     Mental Status: She is alert and oriented to person, place, and time.     Deep Tendon Reflexes: Reflexes are normal and  symmetric.     Reflex Scores:      Brachioradialis reflexes are 2+ on the right side and 2+ on the left side.  Patellar reflexes are 2+ on the right side and 2+ on the left side. Psychiatric:        Attention and Perception: Attention normal.        Mood and Affect: Mood normal.        Speech: Speech normal.        Behavior: Behavior normal. Behavior is cooperative.        Thought Content: Thought content normal.    Results for orders placed or performed in visit on 10/11/23  QuantiFERON-TB Gold Plus   Collection Time: 10/11/23  2:02 PM  Result Value Ref Range   QuantiFERON Incubation Incubation performed.    QuantiFERON Criteria Comment    QuantiFERON TB1 Ag Value 0.04 IU/mL   QuantiFERON TB2 Ag Value 0.05 IU/mL   QuantiFERON Nil Value 0.04 IU/mL   QuantiFERON Mitogen Value >10.00 IU/mL   QuantiFERON-TB Gold Plus Negative Negative      Assessment & Plan:   Problem List Items Addressed This Visit       Cardiovascular and Mediastinum   Hypertension   Chronic, stable.  BP at goal in office today.  Recommend she monitor BP at least a few mornings a week at home and document.  DASH diet at home.  Continue current medication regimen and adjust as needed.  LABS: up to date.  Refills up to date.          Other   Obesity   BMI 42.29.  Recommended eating smaller high protein, low fat meals more frequently and exercising 30 mins a day 5 times a week with a goal of 10-15lb weight loss in the next 3 months. Patient voiced their understanding and motivation to adhere to these recommendations.       Nicotine  dependence, cigarettes, uncomplicated   I have recommended complete cessation of tobacco use. I have discussed various options available for assistance with tobacco cessation including over the counter methods (Nicotine  gum, patch and lozenges). We also discussed prescription options (Chantix, Nicotine  Inhaler / Nasal Spray). The patient is not interested in pursuing any  prescription tobacco cessation options at this time.       Insomnia   Chronic, ongoing.  Continue Lunesta  as needed only and Amitriptyline  at HS for sleep.  Drug screen up to date and contract on file.  Refills as needed.      Continuous opioid dependence (HCC) - Primary   Chronic, ongoing.  She is followed by pain management in [place] at this time, continue this collaboration.  They are refilling all pain related medications.      Anxiety   Chronic, ongoing.  Denies SI/HI.  Continue Duloxetine  and Buspar . Monitor use with Amitriptyline  on board.  Would avoid Wellbutrin, which would benefit smoking cessation and mood, as could reduce seizure threshold taken with Amitriptyline .          Follow up plan: Return in about 6 months (around 07/29/2024) for Annual Physical after 07/28/24.

## 2024-01-27 NOTE — Assessment & Plan Note (Signed)
 Chronic, ongoing.  Denies SI/HI.  Continue Duloxetine  and Buspar . Monitor use with Amitriptyline  on board.  Would avoid Wellbutrin, which would benefit smoking cessation and mood, as could reduce seizure threshold taken with Amitriptyline .

## 2024-01-27 NOTE — Assessment & Plan Note (Signed)
 Chronic, ongoing.  She is followed by pain management in [place] at this time, continue this collaboration.  They are refilling all pain related medications.

## 2024-01-27 NOTE — Assessment & Plan Note (Signed)
 I have recommended complete cessation of tobacco use. I have discussed various options available for assistance with tobacco cessation including over the counter methods (Nicotine gum, patch and lozenges). We also discussed prescription options (Chantix, Nicotine Inhaler / Nasal Spray). The patient is not interested in pursuing any prescription tobacco cessation options at this time.

## 2024-01-27 NOTE — Assessment & Plan Note (Signed)
 BMI 42.29.  Recommended eating smaller high protein, low fat meals more frequently and exercising 30 mins a day 5 times a week with a goal of 10-15lb weight loss in the next 3 months. Patient voiced their understanding and motivation to adhere to these recommendations.

## 2024-01-27 NOTE — Assessment & Plan Note (Signed)
Chronic, stable.  BP at goal in office today.  Recommend she monitor BP at least a few mornings a week at home and document.  DASH diet at home.  Continue current medication regimen and adjust as needed.  LABS: up to date.  Refills up to date.

## 2024-01-27 NOTE — Assessment & Plan Note (Signed)
 Chronic, ongoing.  Continue Lunesta  as needed only and Amitriptyline  at HS for sleep.  Drug screen up to date and contract on file.  Refills as needed.

## 2024-03-10 ENCOUNTER — Other Ambulatory Visit: Payer: Self-pay | Admitting: Nurse Practitioner

## 2024-03-10 DIAGNOSIS — J029 Acute pharyngitis, unspecified: Secondary | ICD-10-CM

## 2024-03-10 DIAGNOSIS — R059 Cough, unspecified: Secondary | ICD-10-CM

## 2024-03-10 DIAGNOSIS — R0989 Other specified symptoms and signs involving the circulatory and respiratory systems: Secondary | ICD-10-CM

## 2024-03-13 NOTE — Telephone Encounter (Signed)
 Requested medication (s) are due for refill today: routing for review  Requested medication (s) are on the active medication list: yes  Last refill:  10/18/23  Future visit scheduled: {Yes  Notes to clinic:  should patient continue to take, routing for review.     Requested Prescriptions  Pending Prescriptions Disp Refills   albuterol  (VENTOLIN  HFA) 108 (90 Base) MCG/ACT inhaler [Pharmacy Med Name: ALBUTEROL  HFA 90 MCG INHALER] 8.5 g     Sig: INHALE 2 PUFFS BY MOUTH EVERY 6 HOURS AS NEEDED FOR WHEEZING OR SHORTNESS OF BREATH     Pulmonology:  Beta Agonists 2 Passed - 03/13/2024 12:11 PM      Passed - Last BP in normal range    BP Readings from Last 1 Encounters:  01/27/24 123/78         Passed - Last Heart Rate in normal range    Pulse Readings from Last 1 Encounters:  01/27/24 98         Passed - Valid encounter within last 12 months    Recent Outpatient Visits           1 month ago Continuous opioid dependence (HCC)   [place] [facility] [place], [place] T, NP   5 months ago Acute non-recurrent maxillary sinusitis   [place] [facility] [place], [place] T, NP   7 months ago Continuous opioid dependence [facility])   [place] [facility] [place], Melanie DASEN, NP

## 2024-03-28 ENCOUNTER — Other Ambulatory Visit: Payer: Self-pay | Admitting: Nurse Practitioner

## 2024-03-30 NOTE — Telephone Encounter (Signed)
 Requested medication (s) are due for refill today: yes  Requested medication (s) are on the active medication list: yes  Last refill:  12/01/23  Future visit scheduled: yes  Notes to clinic:  Unable to refill per protocol, cannot delegate.      Requested Prescriptions  Pending Prescriptions Disp Refills   Eszopiclone  3 MG TABS [Pharmacy Med Name: ESZOPICLONE  3 MG TABLET] 30 tablet     Sig: TAKE 1 TABLET BY MOUTH AT BEDTIME AS NEEDED     Not Delegated - Psychiatry:  Anxiolytics/Hypnotics Failed - 03/30/2024  8:54 AM      Failed - This refill cannot be delegated      Failed - Urine Drug Screen completed in last 360 days      Passed - Valid encounter within last 6 months    Recent Outpatient Visits           2 months ago Continuous opioid dependence (HCC)   [place] [facility] [place], [place] T, NP   5 months ago Acute non-recurrent maxillary sinusitis   [place] [facility] [place], [place] T, NP   8 months ago Continuous opioid dependence [facility])   [place] [facility] [place], Melanie DASEN, NP

## 2024-04-08 ENCOUNTER — Other Ambulatory Visit: Payer: Self-pay | Admitting: Nurse Practitioner

## 2024-04-08 DIAGNOSIS — F1120 Opioid dependence, uncomplicated: Secondary | ICD-10-CM

## 2024-04-10 NOTE — Telephone Encounter (Signed)
 Requested by interface surescripts. Last OV 2 months ago . Requested Prescriptions  Pending Prescriptions Disp Refills   amitriptyline  (ELAVIL ) 50 MG tablet [Pharmacy Med Name: AMITRIPTYLINE  HCL 50 MG TAB] 90 tablet 0    Sig: TAKE 1 TABLET BY MOUTH AT BEDTIME     Psychiatry:  Antidepressants - Heterocyclics (TCAs) Passed - 04/10/2024 10:49 AM      Passed - Valid encounter within last 6 months    Recent Outpatient Visits           2 months ago Continuous opioid dependence (HCC)   [place] [facility] [place], Melanie T, NP   6 months ago Acute non-recurrent maxillary sinusitis   [place] [facility] [place], [place] T, NP   8 months ago Continuous opioid dependence (HCC)   [place] [facility] [place], Jolene T, NP               busPIRone  (BUSPAR ) 10 MG tablet [Pharmacy Med Name: busPIRone  HCL 10 MG TABLET] 180 tablet 0    Sig: TAKE 1 TABLET BY MOUTH 2 TIMES A DAY     Psychiatry: Anxiolytics/Hypnotics - Non-controlled Passed - 04/10/2024 10:49 AM      Passed - Valid encounter within last 12 months    Recent Outpatient Visits           2 months ago Continuous opioid dependence (HCC)   [place] [facility] [place], [place] T, NP   6 months ago Acute non-recurrent maxillary sinusitis   [place] [facility] [place], [place] T, NP   8 months ago Continuous opioid dependence [facility])    [facility] [place], Melanie DASEN, NP

## 2024-06-04 ENCOUNTER — Ambulatory Visit

## 2024-06-04 VITALS — BP 132/84 | HR 96 | Temp 98.2°F | Resp 15 | Ht 62.01 in | Wt 236.4 lb

## 2024-06-04 DIAGNOSIS — J019 Acute sinusitis, unspecified: Secondary | ICD-10-CM

## 2024-06-04 DIAGNOSIS — J9801 Acute bronchospasm: Secondary | ICD-10-CM

## 2024-06-04 MED ORDER — PREDNISONE 10 MG (21) PO TBPK: ORAL_TABLET | ORAL | 0 refills | Status: AC

## 2024-06-04 MED ORDER — AMOXICILLIN-POT CLAVULANATE 875-125 MG PO TABS: 1.0000 | ORAL_TABLET | Freq: Two times a day (BID) | ORAL | 0 refills | Status: AC

## 2024-06-04 NOTE — Progress Notes (Signed)
 "  BP 132/84 (BP Location: Left Arm, Patient Position: Sitting, Cuff Size: Large)   Pulse 96   Temp 98.2 F (36.8 C) (Oral)   Resp 15   Ht 5' 2.01 (1.575 m)   Wt 236 lb 6.4 oz (107.2 kg)   LMP 02/25/2009   SpO2 98%   BMI 43.23 kg/m    Subjective:    Patient ID: Holly French, female    DOB: Sep 10, 1975, 48 y.o.   MRN: [id number]  HPI: Holly French is a 48 y.o. female with URI symptoms x 1 month.  She c/o facial pain, sinus swelling, frequent coughing, nasal congestion, and brownish sputum.  Denies hemoptysis.  C/o chest tenderness/pain and back pain she believes is from the coughing.  She is using otc nasal spray, her albuterol  inhaler multiple times per day, and taking tylenol  sinus.  She reports hx of asthma and multiple sinus infections/surgeries. Holly French  [facility] Complaint  Patient presents with   URI    Ongoing for the last month. Sinus pressure, pain congestion, sinus draining and coughing and back pain.      Relevant past medical, surgical, family and social history reviewed and updated as indicated. Interim medical history since our last visit reviewed. Allergies and medications reviewed and updated.  Review of Systems  Constitutional:  Negative for activity change, appetite change, chills, fatigue and fever.  HENT:  Positive for congestion, ear pain, facial swelling, postnasal drip, rhinorrhea, sinus pressure, sinus pain, sneezing and sore throat.   Respiratory:  Positive for cough, choking, chest tightness, shortness of breath and wheezing.   Cardiovascular:  Positive for chest pain.  Gastrointestinal:  Positive for nausea and vomiting.  Genitourinary:  Negative for flank pain.  Musculoskeletal:  Negative for arthralgias and neck pain.    Per HPI unless specifically indicated above     Objective:    BP 132/84 (BP Location: Left Arm, Patient Position: Sitting, Cuff Size: Large)   Pulse 96   Temp 98.2 F (36.8 C) (Oral)   Resp 15    Ht 5' 2.01 (1.575 m)   Wt 236 lb 6.4 oz (107.2 kg)   LMP 02/25/2009   SpO2 98%   BMI 43.23 kg/m   Wt Readings from Last 3 Encounters:  06/04/24 236 lb 6.4 oz (107.2 kg)  01/27/24 231 lb 3.2 oz (104.9 kg)  10/11/23 228 lb 12.8 oz (103.8 kg)    Physical Exam Constitutional:      General: She is not in acute distress.    Appearance: Normal appearance. She is not ill-appearing, toxic-appearing or diaphoretic.  HENT:     Head: Normocephalic and atraumatic.     Right Ear: Tympanic membrane normal.     Left Ear: Tympanic membrane normal.     Nose: Nose normal.     Mouth/Throat:     Mouth: Mucous membranes are moist.     Pharynx: No oropharyngeal exudate or posterior oropharyngeal erythema.  Cardiovascular:     Rate and Rhythm: Normal rate and regular rhythm.  Pulmonary:     Effort: No prolonged expiration or respiratory distress.     Breath sounds: Decreased air movement present.  Musculoskeletal:        General: Normal range of motion.     Cervical back: Normal range of motion and neck supple. No tenderness.  Lymphadenopathy:     Cervical: No cervical adenopathy.  Skin:    General: Skin is warm and dry.  Neurological:  Mental Status: She is alert.     Results for orders placed or performed in visit on 10/11/23  QuantiFERON-TB Gold Plus   Collection Time: 10/11/23  2:02 PM  Result Value Ref Range   QuantiFERON Incubation Incubation performed.    QuantiFERON Criteria Comment    QuantiFERON TB1 Ag Value 0.04 IU/mL   QuantiFERON TB2 Ag Value 0.05 IU/mL   QuantiFERON Nil Value 0.04 IU/mL   QuantiFERON Mitogen Value >10.00 IU/mL   QuantiFERON-TB Gold Plus Negative Negative      Assessment & Plan:   Assessment & Plan Acute non-recurrent sinusitis, unspecified location Continue nasal spray and tylenol  as needed.  Will send augmentin  rx. Orders:   amoxicillin -clavulanate (AUGMENTIN ) 875-125 MG tablet; Take 1 tablet by mouth 2 (two) times daily.  Cough due to  bronchospasm Steroid dose pack.  Continue using albuterol  as needed for bronchospasm.  Declines refill. Orders:   predniSONE  (STERAPRED UNI-PAK 21 TAB) 10 MG (21) TBPK tablet; Take 6 tablets (60 mg total) by mouth daily for 1 day, THEN 5 tablets (50 mg total) daily for 1 day, THEN 4 tablets (40 mg total) daily for 1 day, THEN 3 tablets (30 mg total) daily for 1 day, THEN 2 tablets (20 mg total) daily for 1 day, THEN 1 tablet (10 mg total) daily for 1 day.   Follow up plan: Follow up if you feel you are worsening or not improving.       "

## 2024-06-21 ENCOUNTER — Ambulatory Visit: Admitting: Family Medicine

## 2024-07-10 ENCOUNTER — Other Ambulatory Visit: Payer: Self-pay | Admitting: Nurse Practitioner

## 2024-07-10 DIAGNOSIS — F1120 Opioid dependence, uncomplicated: Secondary | ICD-10-CM

## 2024-07-10 NOTE — Telephone Encounter (Signed)
 Requested Prescriptions  Pending Prescriptions Disp Refills   busPIRone  (BUSPAR ) 10 MG tablet [Pharmacy Med Name: busPIRone  HCL 10 MG TABLET] 180 tablet 0    Sig: TAKE 1 TABLET BY MOUTH 2 TIMES A DAY     Psychiatry: Anxiolytics/Hypnotics - Non-controlled Passed - 07/10/2024  5:53 PM      Passed - Valid encounter within last 12 months    Recent Outpatient Visits           1 month ago Acute non-recurrent sinusitis, unspecified location   [place] [facility] [place] HERO, FNP   5 months ago Continuous opioid dependence [facility])   [place] [facility] [place], Melanie T, NP   9 months ago Acute non-recurrent maxillary sinusitis   [place] [place], [place] T, NP   11 months ago Continuous opioid dependence (HCC)   [place] [facility] [place], Jolene T, NP               amitriptyline  (ELAVIL ) 50 MG tablet [Pharmacy Med Name: AMITRIPTYLINE  HCL 50 MG TAB] 90 tablet 0    Sig: TAKE 1 TABLET BY MOUTH AT BEDTIME     Psychiatry:  Antidepressants - Heterocyclics (TCAs) Passed - 07/10/2024  5:53 PM      Passed - Valid encounter within last 6 months    Recent Outpatient Visits           1 month ago Acute non-recurrent sinusitis, unspecified location   [facility] Con [place] HERO, FNP   5 months ago Continuous opioid dependence [facility])   [place] [facility] [place], Melanie T, NP   9 months ago Acute non-recurrent maxillary sinusitis   [place] [facility] [place], [place] T, NP   11 months ago Continuous opioid dependence [facility])   [place] [facility] [place], Melanie DASEN, NP

## 2024-08-01 ENCOUNTER — Encounter: Admitting: Nurse Practitioner
